# Patient Record
Sex: Female | Born: 1945 | Race: Black or African American | Hispanic: No | Marital: Single | Smoking: Never smoker
Health system: Southern US, Community
[De-identification: ages and names within clinical notes are randomized; demographics above are authoritative.]

## PROBLEM LIST (undated history)

## (undated) DIAGNOSIS — M109 Gout, unspecified: Secondary | ICD-10-CM

## (undated) DIAGNOSIS — Z96641 Presence of right artificial hip joint: Secondary | ICD-10-CM

## (undated) DIAGNOSIS — M1611 Unilateral primary osteoarthritis, right hip: Secondary | ICD-10-CM

## (undated) DIAGNOSIS — M1612 Unilateral primary osteoarthritis, left hip: Secondary | ICD-10-CM

## (undated) DIAGNOSIS — E559 Vitamin D deficiency, unspecified: Secondary | ICD-10-CM

## (undated) DIAGNOSIS — T783XXA Angioneurotic edema, initial encounter: Secondary | ICD-10-CM

## (undated) DIAGNOSIS — K922 Gastrointestinal hemorrhage, unspecified: Secondary | ICD-10-CM

## (undated) DIAGNOSIS — J45909 Unspecified asthma, uncomplicated: Secondary | ICD-10-CM

## (undated) DIAGNOSIS — K5520 Angiodysplasia of colon without hemorrhage: Secondary | ICD-10-CM

## (undated) DIAGNOSIS — M1990 Unspecified osteoarthritis, unspecified site: Secondary | ICD-10-CM

## (undated) DIAGNOSIS — K921 Melena: Secondary | ICD-10-CM

## (undated) DIAGNOSIS — R7303 Prediabetes: Secondary | ICD-10-CM

## (undated) DIAGNOSIS — Z7901 Long term (current) use of anticoagulants: Secondary | ICD-10-CM

## (undated) DIAGNOSIS — E039 Hypothyroidism, unspecified: Secondary | ICD-10-CM

## (undated) DIAGNOSIS — M25551 Pain in right hip: Secondary | ICD-10-CM

## (undated) DIAGNOSIS — Z9289 Personal history of other medical treatment: Secondary | ICD-10-CM

## (undated) DIAGNOSIS — R0600 Dyspnea, unspecified: Secondary | ICD-10-CM

## (undated) DIAGNOSIS — R220 Localized swelling, mass and lump, head: Secondary | ICD-10-CM

## (undated) DIAGNOSIS — I10 Essential (primary) hypertension: Secondary | ICD-10-CM

## (undated) DIAGNOSIS — K219 Gastro-esophageal reflux disease without esophagitis: Secondary | ICD-10-CM

## (undated) DIAGNOSIS — E119 Type 2 diabetes mellitus without complications: Secondary | ICD-10-CM

## (undated) DIAGNOSIS — D509 Iron deficiency anemia, unspecified: Secondary | ICD-10-CM

## (undated) DIAGNOSIS — L309 Dermatitis, unspecified: Secondary | ICD-10-CM

## (undated) DIAGNOSIS — Z96642 Presence of left artificial hip joint: Secondary | ICD-10-CM

## (undated) DIAGNOSIS — D62 Acute posthemorrhagic anemia: Secondary | ICD-10-CM

## (undated) DIAGNOSIS — E785 Hyperlipidemia, unspecified: Secondary | ICD-10-CM

## (undated) DIAGNOSIS — M25561 Pain in right knee: Secondary | ICD-10-CM

## (undated) DIAGNOSIS — H4010X0 Unspecified open-angle glaucoma, stage unspecified: Secondary | ICD-10-CM

## (undated) DIAGNOSIS — I82409 Acute embolism and thrombosis of unspecified deep veins of unspecified lower extremity: Secondary | ICD-10-CM

## (undated) HISTORY — DX: Gastro-esophageal reflux disease without esophagitis: K21.9

## (undated) HISTORY — DX: Acute posthemorrhagic anemia: D62

## (undated) HISTORY — DX: Pain in right hip: M25.551

## (undated) HISTORY — DX: Presence of left artificial hip joint: Z96.642

## (undated) HISTORY — DX: Pain in right knee: M25.561

## (undated) HISTORY — DX: Long term (current) use of anticoagulants: Z79.01

## (undated) HISTORY — PX: THYROIDECTOMY: SHX17

## (undated) HISTORY — DX: Unilateral primary osteoarthritis, right hip: M16.11

## (undated) HISTORY — DX: Gastrointestinal hemorrhage, unspecified: K92.2

## (undated) HISTORY — DX: Presence of right artificial hip joint: Z96.641

## (undated) HISTORY — DX: Melena: K92.1

## (undated) HISTORY — DX: Hypomagnesemia: E83.42

## (undated) HISTORY — PX: ABDOMINAL HYSTERECTOMY: SHX81

## (undated) HISTORY — DX: Unilateral primary osteoarthritis, left hip: M16.12

---

## 2016-02-09 DIAGNOSIS — T783XXA Angioneurotic edema, initial encounter: Secondary | ICD-10-CM

## 2016-02-09 HISTORY — PX: COLONOSCOPY: SHX174

## 2016-02-09 HISTORY — DX: Angioneurotic edema, initial encounter: T78.3XXA

## 2016-02-09 HISTORY — PX: ESOPHAGOGASTRODUODENOSCOPY: SHX1529

## 2016-02-09 HISTORY — PX: GIVENS CAPSULE STUDY: SHX5432

## 2016-06-08 DIAGNOSIS — I82409 Acute embolism and thrombosis of unspecified deep veins of unspecified lower extremity: Secondary | ICD-10-CM

## 2016-06-08 HISTORY — DX: Acute embolism and thrombosis of unspecified deep veins of unspecified lower extremity: I82.409

## 2016-08-08 DIAGNOSIS — R220 Localized swelling, mass and lump, head: Secondary | ICD-10-CM

## 2016-08-08 HISTORY — DX: Localized swelling, mass and lump, head: R22.0

## 2016-08-17 ENCOUNTER — Encounter (HOSPITAL_COMMUNITY): Payer: Self-pay | Admitting: *Deleted

## 2016-08-17 ENCOUNTER — Observation Stay (HOSPITAL_COMMUNITY)
Admission: EM | Admit: 2016-08-17 | Discharge: 2016-08-18 | Disposition: A | Discharge status: Home / Self Care | Payer: Medicare Other | Attending: Internal Medicine | Admitting: Internal Medicine

## 2016-08-17 DIAGNOSIS — K219 Gastro-esophageal reflux disease without esophagitis: Secondary | ICD-10-CM

## 2016-08-17 DIAGNOSIS — Z7901 Long term (current) use of anticoagulants: Secondary | ICD-10-CM | POA: Insufficient documentation

## 2016-08-17 DIAGNOSIS — E079 Disorder of thyroid, unspecified: Secondary | ICD-10-CM | POA: Insufficient documentation

## 2016-08-17 DIAGNOSIS — R600 Localized edema: Secondary | ICD-10-CM | POA: Diagnosis present

## 2016-08-17 DIAGNOSIS — R05 Cough: Secondary | ICD-10-CM | POA: Insufficient documentation

## 2016-08-17 DIAGNOSIS — E039 Hypothyroidism, unspecified: Secondary | ICD-10-CM

## 2016-08-17 DIAGNOSIS — Z91010 Allergy to peanuts: Secondary | ICD-10-CM | POA: Diagnosis not present

## 2016-08-17 DIAGNOSIS — Z7982 Long term (current) use of aspirin: Secondary | ICD-10-CM | POA: Diagnosis not present

## 2016-08-17 DIAGNOSIS — I10 Essential (primary) hypertension: Secondary | ICD-10-CM | POA: Insufficient documentation

## 2016-08-17 DIAGNOSIS — Z7984 Long term (current) use of oral hypoglycemic drugs: Secondary | ICD-10-CM | POA: Insufficient documentation

## 2016-08-17 DIAGNOSIS — D649 Anemia, unspecified: Secondary | ICD-10-CM | POA: Insufficient documentation

## 2016-08-17 DIAGNOSIS — T783XXA Angioneurotic edema, initial encounter: Secondary | ICD-10-CM | POA: Diagnosis not present

## 2016-08-17 DIAGNOSIS — D500 Iron deficiency anemia secondary to blood loss (chronic): Secondary | ICD-10-CM

## 2016-08-17 DIAGNOSIS — R21 Rash and other nonspecific skin eruption: Secondary | ICD-10-CM | POA: Insufficient documentation

## 2016-08-17 DIAGNOSIS — E119 Type 2 diabetes mellitus without complications: Secondary | ICD-10-CM | POA: Diagnosis not present

## 2016-08-17 DIAGNOSIS — D509 Iron deficiency anemia, unspecified: Secondary | ICD-10-CM

## 2016-08-17 DIAGNOSIS — Z79899 Other long term (current) drug therapy: Secondary | ICD-10-CM | POA: Diagnosis not present

## 2016-08-17 DIAGNOSIS — E785 Hyperlipidemia, unspecified: Secondary | ICD-10-CM

## 2016-08-17 DIAGNOSIS — I82409 Acute embolism and thrombosis of unspecified deep veins of unspecified lower extremity: Secondary | ICD-10-CM

## 2016-08-17 HISTORY — DX: Localized swelling, mass and lump, head: R22.0

## 2016-08-17 HISTORY — DX: Acute embolism and thrombosis of unspecified deep veins of unspecified lower extremity: I82.409

## 2016-08-17 HISTORY — DX: Hyperlipidemia, unspecified: E78.5

## 2016-08-17 HISTORY — DX: Type 2 diabetes mellitus without complications: E11.9

## 2016-08-17 HISTORY — DX: Gastro-esophageal reflux disease without esophagitis: K21.9

## 2016-08-17 HISTORY — DX: Essential (primary) hypertension: I10

## 2016-08-17 LAB — CBC WITH DIFFERENTIAL/PLATELET
BASOS ABS: 0 10*3/uL (ref 0.0–0.1)
Basophils Relative: 0 %
EOS PCT: 2 %
Eosinophils Absolute: 0.2 10*3/uL (ref 0.0–0.7)
HEMATOCRIT: 21.9 % — AB (ref 36.0–46.0)
Hemoglobin: 6.3 g/dL — CL (ref 12.0–15.0)
LYMPHS ABS: 2.4 10*3/uL (ref 0.7–4.0)
Lymphocytes Relative: 31 %
MCH: 20.8 pg — ABNORMAL LOW (ref 26.0–34.0)
MCHC: 28.8 g/dL — AB (ref 30.0–36.0)
MCV: 72.3 fL — AB (ref 78.0–100.0)
MONOS PCT: 5 %
Monocytes Absolute: 0.4 10*3/uL (ref 0.1–1.0)
NEUTROS ABS: 4.6 10*3/uL (ref 1.7–7.7)
Neutrophils Relative %: 62 %
Platelets: 332 10*3/uL (ref 150–400)
RBC: 3.03 MIL/uL — ABNORMAL LOW (ref 3.87–5.11)
RDW: 17.3 % — AB (ref 11.5–15.5)
WBC: 7.6 10*3/uL (ref 4.0–10.5)

## 2016-08-17 LAB — HEPATIC FUNCTION PANEL
ALK PHOS: 85 U/L (ref 38–126)
ALT: 14 U/L (ref 14–54)
AST: 21 U/L (ref 15–41)
Albumin: 3.7 g/dL (ref 3.5–5.0)
BILIRUBIN TOTAL: 1.1 mg/dL (ref 0.3–1.2)
Bilirubin, Direct: <0.1 mg/dL — ABNORMAL LOW (ref 0.1–0.5)
TOTAL PROTEIN: 7.2 g/dL (ref 6.5–8.1)

## 2016-08-17 LAB — CBC
HCT: 31.2 % — ABNORMAL LOW (ref 36.0–46.0)
Hemoglobin: 9.7 g/dL — ABNORMAL LOW (ref 12.0–15.0)
MCH: 23.4 pg — ABNORMAL LOW (ref 26.0–34.0)
MCHC: 31.1 g/dL (ref 30.0–36.0)
MCV: 75.4 fL — ABNORMAL LOW (ref 78.0–100.0)
PLATELETS: 336 10*3/uL (ref 150–400)
RBC: 4.14 MIL/uL (ref 3.87–5.11)
RDW: 18.3 % — AB (ref 11.5–15.5)
WBC: 8.5 10*3/uL (ref 4.0–10.5)

## 2016-08-17 LAB — BASIC METABOLIC PANEL
Anion gap: 10 (ref 5–15)
BUN: 10 mg/dL (ref 6–20)
CO2: 22 mmol/L (ref 22–32)
Calcium: 9.1 mg/dL (ref 8.9–10.3)
Chloride: 107 mmol/L (ref 101–111)
Creatinine, Ser: 0.85 mg/dL (ref 0.44–1.00)
GFR calc Af Amer: >60 mL/min (ref >60)
GLUCOSE: 103 mg/dL — AB (ref 65–99)
POTASSIUM: 3.5 mmol/L (ref 3.5–5.1)
Sodium: 139 mmol/L (ref 135–145)

## 2016-08-17 LAB — GLUCOSE, CAPILLARY
GLUCOSE-CAPILLARY: 187 mg/dL — AB (ref 65–99)
Glucose-Capillary: 259 mg/dL — ABNORMAL HIGH (ref 65–99)
Glucose-Capillary: 175 mg/dL — ABNORMAL HIGH (ref 65–99)

## 2016-08-17 LAB — URINALYSIS, ROUTINE W REFLEX MICROSCOPIC
BILIRUBIN URINE: NEGATIVE
Glucose, UA: NEGATIVE mg/dL
HGB URINE DIPSTICK: NEGATIVE
KETONES UR: NEGATIVE mg/dL
Leukocytes, UA: NEGATIVE
Nitrite: NEGATIVE
PROTEIN: NEGATIVE mg/dL
Specific Gravity, Urine: 1.011 (ref 1.005–1.030)
pH: 5 (ref 5.0–8.0)

## 2016-08-17 LAB — IRON AND TIBC
Iron: 22 ug/dL — ABNORMAL LOW (ref 28–170)
SATURATION RATIOS: 5 % — AB (ref 10.4–31.8)
TIBC: 406 ug/dL (ref 250–450)
UIBC: 384 ug/dL

## 2016-08-17 LAB — RETICULOCYTES
RBC.: 4.2 MIL/uL (ref 3.87–5.11)
RETIC COUNT ABSOLUTE: 100.8 10*3/uL (ref 19.0–186.0)
RETIC CT PCT: 2.4 % (ref 0.4–3.1)

## 2016-08-17 LAB — FERRITIN: Ferritin: 5 ng/mL — ABNORMAL LOW (ref 11–307)

## 2016-08-17 LAB — LACTATE DEHYDROGENASE: LDH: 156 U/L (ref 98–192)

## 2016-08-17 LAB — OCCULT BLOOD X 1 CARD TO LAB, STOOL: Fecal Occult Bld: POSITIVE — AB

## 2016-08-17 LAB — ABO/RH: ABO/RH(D): O POS

## 2016-08-17 LAB — PREPARE RBC (CROSSMATCH)

## 2016-08-17 LAB — SAVE SMEAR

## 2016-08-17 MED ORDER — POLYETHYLENE GLYCOL 3350 17 G PO PACK: 17.0000 g | PACK | Freq: Every day | ORAL | Status: DC | PRN

## 2016-08-17 MED ORDER — PREDNISONE 20 MG PO TABS
40.0000 mg | ORAL_TABLET | Freq: Every day | ORAL | Status: DC
  Administered 2016-08-18: 40 mg via ORAL
  Filled 2016-08-17: qty 2

## 2016-08-17 MED ORDER — HYDRALAZINE HCL 25 MG PO TABS
25.0000 mg | ORAL_TABLET | Freq: Three times a day (TID) | ORAL | Status: DC
  Administered 2016-08-17 – 2016-08-18 (×5): 25 mg via ORAL
  Filled 2016-08-17 (×5): qty 1

## 2016-08-17 MED ORDER — LIDOCAINE HCL 4 % EX SOLN
Freq: Once | CUTANEOUS | Status: DC
  Filled 2016-08-17: qty 50

## 2016-08-17 MED ORDER — ENOXAPARIN SODIUM 40 MG/0.4ML ~~LOC~~ SOLN
40.0000 mg | SUBCUTANEOUS | Status: DC
  Administered 2016-08-17: 40 mg via SUBCUTANEOUS
  Filled 2016-08-17: qty 0.4

## 2016-08-17 MED ORDER — EPINEPHRINE 0.3 MG/0.3ML IJ SOAJ
0.3000 mg | Freq: Once | INTRAMUSCULAR | Status: AC
  Administered 2016-08-17: 0.3 mg via INTRAMUSCULAR
  Filled 2016-08-17: qty 0.3

## 2016-08-17 MED ORDER — LIDOCAINE VISCOUS 2 % MT SOLN
15.0000 mL | Freq: Once | OROMUCOSAL | Status: DC
  Filled 2016-08-17: qty 15

## 2016-08-17 MED ORDER — PRAVASTATIN SODIUM 20 MG PO TABS
20.0000 mg | ORAL_TABLET | Freq: Every day | ORAL | Status: DC
  Administered 2016-08-17 – 2016-08-18 (×2): 20 mg via ORAL
  Filled 2016-08-17 (×2): qty 1

## 2016-08-17 MED ORDER — METHYLPREDNISOLONE SODIUM SUCC 125 MG IJ SOLR
125.0000 mg | Freq: Once | INTRAMUSCULAR | Status: AC
  Administered 2016-08-17: 125 mg via INTRAVENOUS
  Filled 2016-08-17: qty 2

## 2016-08-17 MED ORDER — PANTOPRAZOLE SODIUM 40 MG PO TBEC
40.0000 mg | DELAYED_RELEASE_TABLET | Freq: Every day | ORAL | Status: DC
  Administered 2016-08-17 – 2016-08-18 (×2): 40 mg via ORAL
  Filled 2016-08-17 (×2): qty 1

## 2016-08-17 MED ORDER — DIPHENHYDRAMINE HCL 25 MG PO CAPS
50.0000 mg | ORAL_CAPSULE | Freq: Once | ORAL | Status: AC
  Administered 2016-08-17: 50 mg via ORAL
  Filled 2016-08-17: qty 2

## 2016-08-17 MED ORDER — METOPROLOL SUCCINATE ER 50 MG PO TB24
50.0000 mg | ORAL_TABLET | Freq: Every day | ORAL | Status: DC
  Administered 2016-08-17: 50 mg via ORAL
  Filled 2016-08-17: qty 1

## 2016-08-17 MED ORDER — ACETAMINOPHEN 325 MG PO TABS: 650.0000 mg | ORAL_TABLET | Freq: Four times a day (QID) | ORAL | Status: DC | PRN

## 2016-08-17 MED ORDER — LEVOTHYROXINE SODIUM 100 MCG PO TABS
100.0000 ug | ORAL_TABLET | Freq: Every day | ORAL | Status: DC
  Administered 2016-08-18: 100 ug via ORAL
  Filled 2016-08-17: qty 1

## 2016-08-17 MED ORDER — INSULIN ASPART 100 UNIT/ML ~~LOC~~ SOLN: 0.0000 [IU] | Freq: Every day | SUBCUTANEOUS | Status: DC

## 2016-08-17 MED ORDER — INSULIN ASPART 100 UNIT/ML ~~LOC~~ SOLN: 0.0000 [IU] | Freq: Three times a day (TID) | SUBCUTANEOUS | Status: DC

## 2016-08-17 MED ORDER — ACETAMINOPHEN 650 MG RE SUPP: 650.0000 mg | Freq: Four times a day (QID) | RECTAL | Status: DC | PRN

## 2016-08-17 MED ORDER — DIPHENHYDRAMINE HCL 50 MG/ML IJ SOLN
25.0000 mg | Freq: Once | INTRAMUSCULAR | Status: AC
  Administered 2016-08-17: 25 mg via INTRAVENOUS
  Filled 2016-08-17: qty 1

## 2016-08-17 MED ORDER — SODIUM CHLORIDE 0.9 % IV SOLN
10.0000 mL/h | Freq: Once | INTRAVENOUS | Status: AC
  Administered 2016-08-17: 10 mL/h via INTRAVENOUS

## 2016-08-17 MED ORDER — FERROUS GLUCONATE 324 (38 FE) MG PO TABS
324.0000 mg | ORAL_TABLET | Freq: Every day | ORAL | Status: DC
  Administered 2016-08-18: 324 mg via ORAL
  Filled 2016-08-17: qty 1

## 2016-08-17 MED ORDER — FAMOTIDINE IN NACL 20-0.9 MG/50ML-% IV SOLN
20.0000 mg | Freq: Once | INTRAVENOUS | Status: AC
  Administered 2016-08-17: 20 mg via INTRAVENOUS
  Filled 2016-08-17: qty 50

## 2016-08-17 MED ORDER — ALLOPURINOL 100 MG PO TABS
100.0000 mg | ORAL_TABLET | Freq: Every day | ORAL | Status: DC
  Administered 2016-08-17 – 2016-08-18 (×2): 100 mg via ORAL
  Filled 2016-08-17 (×2): qty 1

## 2016-08-17 MED ORDER — RIVAROXABAN 20 MG PO TABS
20.0000 mg | ORAL_TABLET | Freq: Every day | ORAL | Status: DC
  Administered 2016-08-17 – 2016-08-18 (×2): 20 mg via ORAL
  Filled 2016-08-17 (×2): qty 1

## 2016-08-17 NOTE — ED Notes (Signed)
IV team at bedside 

## 2016-08-17 NOTE — ED Provider Notes (Addendum)
[facility]ENCY DEPT Provider Note   CSN: [id number] Arrival date & time: 08/17/16  0417     History   Chief Complaint Chief Complaint  Patient presents with  . Facial Swelling    HPI Dana Nelson is a 71 y.o. female.  HPI  Pt with hx of DM, HTN, HL, DVT on xarelto comes in with cc of tongue swelling. Pt woke up in the middle of the night with cough and saw the swelling over her lips. Pt took benadryl, and went to bed. She woke up later in the night and th swelling had gotten worse - so she decided to come to the Er. Pt has some DIB. Pt also reports some irritation over the throat. She has no drooling. She has no symptoms of this nature in the past. PT is not on new meds and there was nothing out of ordinary yday.  Pt is noted to have severe anemia. She reports that she was admitted at [facility] in [place] 2 weeks ago for anemia and needed transfusion. Pt had upper and lower EGD that was neg.   Past Medical History:  Diagnosis Date  . Diabetes mellitus without complication (HCC)   . Hyperlipemia   . Hypertension   . Thyroid disease     There are no active problems to display for this patient.   No past surgical history on file.  OB History    No data available       Home Medications    Prior to Admission medications   Medication Sig Start Date End Date Taking? Authorizing Provider  acetaminophen (TYLENOL) 500 MG tablet Take 500-1,000 mg by mouth every 6 (six) hours as needed for mild pain.   Yes [provider]  allopurinol (ZYLOPRIM) 100 MG tablet Take 100 mg by mouth daily.   Yes [provider]  aspirin EC 81 MG tablet Take 81 mg by mouth daily.   Yes [provider]  ferrous gluconate (FERGON) 324 MG tablet Take 324 mg by mouth daily with breakfast.   Yes [provider]  furosemide (LASIX) 40 MG tablet Take 40 mg by mouth every other day.    Yes [provider]  hydrALAZINE (APRESOLINE) 25 MG tablet  Take 25 mg by mouth 3 (three) times daily.   Yes [provider]  levothyroxine (SYNTHROID, LEVOTHROID) 100 MCG tablet Take 100 mcg by mouth daily before breakfast.   Yes [provider]  losartan-hydrochlorothiazide (HYZAAR) 100-25 MG tablet Take 1 tablet by mouth daily.   Yes [provider]  metFORMIN (GLUCOPHAGE-XR) 750 MG 24 hr tablet Take 750 mg by mouth 2 (two) times daily.   Yes [provider]  metoprolol succinate (TOPROL-XL) 50 MG 24 hr tablet Take 50 mg by mouth daily. Take with or immediately following a meal.   Yes [provider]  omeprazole (PRILOSEC) 20 MG capsule Take 40 mg by mouth daily.   Yes [provider]  pravastatin (PRAVACHOL) 20 MG tablet Take 20 mg by mouth daily.   Yes [provider]  rivaroxaban (XARELTO) 20 MG TABS tablet Take 20 mg by mouth daily.   Yes [provider]    Family History No family history on file.  Social History Social History  Substance Use Topics  . Smoking status: Never Smoker  . Smokeless tobacco: Not on file  . Alcohol use Not on file     Allergies   Peanut-containing drug products and Penicillins   Review of  Systems Review of Systems  Constitutional: Positive for activity change.  Skin: Positive for rash.  All other systems reviewed and are negative.    Physical Exam Updated Vital Signs BP (!) 159/84   Pulse 94   Temp 98.1 F (36.7 C)   Resp (!) 22   Ht 5\' 3"  (1.6 m)   Wt 76.2 kg (168 lb)   SpO2 100%   BMI 29.76 kg/m   Physical Exam  Constitutional: She is oriented to person, place, and time. She appears well-developed and well-nourished.  HENT:  Head: Normocephalic and atraumatic.  Both lips are edematous, with the upper lip being worse. Oral exam reveals no tongue swelling and the uvula is visible. There is no stridor. Pt has no drooling and is tolerating her secretions w/o problems  Eyes: EOM are normal. Pupils are equal, round, and  reactive to light.  Neck: Neck supple.  Cardiovascular: Normal rate, regular rhythm and normal heart sounds.   No murmur heard. Pulmonary/Chest: Effort normal. No stridor. No respiratory distress. She has no wheezes.  Abdominal: Soft. She exhibits no distension. There is no tenderness. There is no rebound and no guarding.  Neurological: She is alert and oriented to person, place, and time.  Skin: Skin is warm and dry.  Nursing note and vitals reviewed.    ED Treatments / Results  Labs (all labs ordered are listed, but only abnormal results are displayed) Labs Reviewed  BASIC METABOLIC PANEL - Abnormal; Notable for the following:       Result Value   Glucose, Bld 103 (*)    All other components within normal limits  CBC WITH DIFFERENTIAL/PLATELET - Abnormal; Notable for the following:    RBC 3.03 (*)    Hemoglobin 6.3 (*)    HCT 21.9 (*)    MCV 72.3 (*)    MCH 20.8 (*)    MCHC 28.8 (*)    RDW 17.3 (*)    All other components within normal limits  VITAMIN B12  FOLATE  IRON AND TIBC  FERRITIN  RETICULOCYTES  SAVE SMEAR  TYPE AND SCREEN  PREPARE RBC (CROSSMATCH)    EKG  EKG Interpretation None       Radiology No results found.  Procedures Procedures (including critical care time)  CRITICAL CARE Performed by: Derwood Kaplan   Total critical care time: 45 minutes  Critical care time was exclusive of separately billable procedures and treating other patients.  Critical care was necessary to treat or prevent imminent or life-threatening deterioration.  Critical care was time spent personally by me on the following activities: development of treatment plan with patient and/or surrogate as well as nursing, discussions with consultants, evaluation of patient's response to treatment, examination of patient, obtaining history from patient or surrogate, ordering and performing treatments and interventions, ordering and review of laboratory studies, ordering and review  of radiographic studies, pulse oximetry and re-evaluation of patient's condition.   Medications Ordered in ED Medications  lidocaine (XYLOCAINE) 4 % external solution (0 mLs Topical Hold 08/17/16 0459)  lidocaine (XYLOCAINE) 2 % viscous mouth solution 15 mL (0 mLs Mouth/Throat Hold 08/17/16 0459)  0.9 %  sodium chloride infusion (not administered)  EPINEPHrine (EPI-PEN) injection 0.3 mg (0.3 mg Intramuscular Given 08/17/16 0446)  diphenhydrAMINE (BENADRYL) injection 25 mg (25 mg Intravenous Given 08/17/16 0447)  famotidine (PEPCID) IVPB 20 mg premix (0 mg Intravenous Stopped 08/17/16 0519)  methylPREDNISolone sodium succinate (SOLU-MEDROL) 125 mg/2 mL injection 125 mg (125 mg Intravenous Given 08/17/16 0446)  Initial Impression / Assessment and Plan / ED Course  I have reviewed the triage vital signs and the nursing notes.  Pertinent labs & imaging results that were available during my care of the patient were reviewed by me and considered in my medical decision making (see chart for details).  Clinical Course as of Aug 17 813  Tue Aug 17, 2016  0538 S/p epi - feels better. No stridor, no drooling. Lower lip swelling has improved.  [AN]    Clinical Course User Index [AN] Derwood Kaplan, MD    Pt comes in with angioedema. Epi given as she has some throat complains and is having DIB. IV benadryl and steroids.  Pt has anemia.  Anemia panel ordered. Records from OSH requested. Transfusion started.  Final Clinical Impressions(s) / ED Diagnoses   Final diagnoses:  Angioedema, initial encounter  Symptomatic anemia    New Prescriptions New Prescriptions   No medications on file     Derwood Kaplan, MD 08/17/16 8119    Derwood Kaplan, MD 08/17/16 [phone]

## 2016-08-17 NOTE — ED Triage Notes (Signed)
Pt states that she went to bed last night. Pt states that she woke coughing and went to the restroom. States that her t op lip was swollen then. Pt states that she took a benadryl and went to bed. Woke again with upper and lower lip swelling, eye swelling and states "my throat doesn't feel right". Pt reports SOB at triage.

## 2016-08-17 NOTE — H&P (Signed)
Date: 08/17/2016               Patient Name:  Dana Nelson MRN: [id number]  DOB: [birth year] Age / Sex: 71 y.o., female   PCP: Patient, No Pcp Per         Medical Service: Internal Medicine Teaching Service         Attending Physician: Dr. Doneen Poisson, MD    First Contact: Dr. Caron Presume Pager: [phone]  Second Contact: Dr. Loney Loh Pager: [phone]       After Hours (After 5p/  First Contact Pager: [phone]  weekends / holidays): Second Contact Pager: [phone]   Chief Complaint: lip swelling and SOB  History of Present Illness:  This is a 71 y.o. woman with PMHx of recent left LE DVT (on Xarelto), HTN, HLD, hypothyroidism, HLD, acid reflux, OA here with lip and facial swelling and also found to be anemic.  She is visiting from [place].  Patient was in her usual state of health last evening.  Took her evening medications and went to bed at 11:30pm feeling fine.  Shortly after this she started coughing and her face "felt funny".  Her upper lip was swollen so she took a Benadryl and went back to bed.  3 hours later, her bottom lip was swollen and her eyes felt funny now.  She denies any changes in vision or blurry vision.  She also felt dyspneic and thought she was wheezing.  At this point, she decided to seek ED evaluation.  In the ED, she was given Benadryl, Epinephrine, Pepcid, and Solumedrol.  She reports feeling a lot better.  She reports no prior history of this type of swelling.  She has had eczema since childhood and has skin-changes associated with this.  She is allergic to peanut products and penicllins.  She denies any new detergents, soap products, clothes, etc.  Yesterday, she ate a fresh peach and a beef patty with cheese at 2pm.  She then had a Slushy for dinner.  Her grandchildren had banana nut muffins but she said she did not come in to contact with the muffin.  She spent all day outside but does not recall any insect bites.  She is not on an ACE inhibitor but she is on an  ARB.  In the ED, labs revealed a hemoglobin of 6.3 with MCV 72.  She reports symptoms of SOB with exertion off an on for the past week or so.  She denies any chest pressure or discomfort.  No lightheadedness or dizziness.  Occasionally, her fingers feel cold.  She reports LE edema with Left > Right.  She denies any orthopnea or PND.  She reports a history of low hemoglobin.  She was hospitalized in [place] last year and required blood transfusion.  2-3 months ago she was hospitalized at [facility] in [place] and diagnosed with DVT and started on Xarelto.  She denied any recent surgery, trauma, known malignancy, estrogen use, travel that would suggest a provoked blood clot.  She tolerated Xarelto well, but about 3 weeks ago noticed blood in her stool and was hospitalized for blood transfusion again.  She said her hemoglobin at that time was about 7.  She received an EGD and colonoscopy at that time.  She reports a polyp was removed but otherwise was normal.  Does not recall being told about any diverticulosis or AVM.  Has not experienced any bleeding since.  She denies taking any NSAIDs.  She is on Prilosec.  Records from her OSH have been requested.  Meds:  Current Meds  Medication Sig  . acetaminophen (TYLENOL) 500 MG tablet Take 500-1,000 mg by mouth every 6 (six) hours as needed for mild pain.  Marland Kitchen allopurinol (ZYLOPRIM) 100 MG tablet Take 100 mg by mouth daily.  Marland Kitchen aspirin EC 81 MG tablet Take 81 mg by mouth daily.  . ferrous gluconate (FERGON) 324 MG tablet Take 324 mg by mouth daily with breakfast.  . furosemide (LASIX) 40 MG tablet Take 40 mg by mouth every other day.   . hydrALAZINE (APRESOLINE) 25 MG tablet Take 25 mg by mouth 3 (three) times daily.  Marland Kitchen levothyroxine (SYNTHROID, LEVOTHROID) 100 MCG tablet Take 100 mcg by mouth daily before breakfast.  . losartan-hydrochlorothiazide (HYZAAR) 100-25 MG tablet Take 1 tablet by mouth daily.  . metFORMIN (GLUCOPHAGE-XR) 750 MG 24 hr tablet Take 750  mg by mouth 2 (two) times daily.  . metoprolol succinate (TOPROL-XL) 50 MG 24 hr tablet Take 50 mg by mouth daily. Take with or immediately following a meal.  . omeprazole (PRILOSEC) 20 MG capsule Take 40 mg by mouth daily.  . pravastatin (PRAVACHOL) 20 MG tablet Take 20 mg by mouth daily.  . rivaroxaban (XARELTO) 20 MG TABS tablet Take 20 mg by mouth daily.     Allergies: Allergies as of 08/17/2016 - Review Complete 08/17/2016  Allergen Reaction Noted  . Peanut-containing drug products Other (See Comments) 08/17/2016  . Penicillins Other (See Comments) 08/17/2016   Past Medical History:  Diagnosis Date  . Diabetes mellitus without complication (HCC)   . Hyperlipemia   . Hypertension   . Thyroid disease     Family History:  Multiple family members with HTN, HLD, DM, thyroid disease Sister is living but had MI at 71, hx of ovarian cancer Brother had an MI at 70 Father had MI Mother had dementia  Social History: Originally from [place], then lived in [place] for many years.  Worked for [employer] site for 37 years.  Moved to [place] a year a go.  Single.  2 children (son- 103, daughter - 60).  Denies EtOH or tobacco use.  Review of Systems: A complete ROS was negative except as per HPI.   Physical Exam: Blood pressure (!) 159/84, pulse 88, temperature 98.1 F (36.7 C), resp. rate (!) 21, height 5\' 3"  (1.6 m), weight 168 lb (76.2 kg), SpO2 100 %. Physical Exam  Constitutional: She is oriented to person, place, and time and well-developed, well-nourished, and in no distress.  HENT:  Head: Normocephalic and atraumatic.  Mouth/Throat: No oropharyngeal exudate.  Upper lip edematous more so than lower.  There is no tongue swelling, drooling.  Uvula is visible.  She is speaking in complete sentences and tolerating secretions.  Eyes: Conjunctivae and EOM are normal.  Neck: Normal range of motion. Neck supple.  Cardiovascular: Normal rate, regular rhythm and normal heart sounds.    No murmur heard. Pulmonary/Chest: Effort normal and breath sounds normal. No respiratory distress. She has no wheezes.  Abdominal: Soft. She exhibits no distension. There is no tenderness. There is no rebound.  Musculoskeletal: She exhibits edema. She exhibits no tenderness.  Trace lower extremity edema L>R.  Lymphadenopathy:    She has no cervical adenopathy.  Neurological: She is alert and oriented to person, place, and time. No cranial nerve deficit.  Skin: Rash noted.  Vitiliginous changes to her upper and lower extremities as well as neck and scalp.  Eczematous skin changes noted on  her arms as well.  Psychiatric: Mood and affect normal.     Assessment & Plan by Problem: Principal Problem:   Lip swelling Active Problems:   Symptomatic anemia   DVT (deep venous thrombosis) (HCC)   HTN (hypertension)   Hyperlipidemia   Microcytic anemia   Hypothyroidism   GERD (gastroesophageal reflux disease)  Lip Swelling Differential includes: Angioedema (idiopathic vs ARB-induced), contact dermatitis, exposure to allergen such as nuts, or severe hypothyroidism which can cause puffiness of the skin and face.  The latter seems unlikely given she has no other symptoms c/w severe hypothyroid and reports adherence with her Synthroid.  Available evidence suggests the rate of angioedema with ARB therapy is low but remains a possibility.  It is very possible that she may have come in to contact with an allergen to cause a localized swelling reaction.  She denies recent use of NSAIDs and her only new medication is Xarelto.  Currently, she is improved on treatment thus far, speaking in full sentences, and tolerating her secretions. - Benadryl 50mg  PO x 1 dose - Prednisone 40mg  daily x 5 days - If symptoms recur, repeat Benadryl, Pepcid, Epinephrine, and Solumedrol - Hold ARB for now - Will not obtain C4 levels at this time as her history is not consistent with HAE or acquired C1 inhibitor deficiency  (i.e. She had response to antihistamine/steroids and there is no family history of similar events)  Symptomatic Microcytic Anemia Possible GI Bleed She has been experiencing symptoms of DOE for about 1 week.  Hospitalized last month for anemia in [place] with reportedly normal EGD and colonoscopy.  Has continued Xarelto.  She reports her hemoglobin was around 7 at that time and today it is 6.3.  Suspect this is a slow bleed from somewhere given her relatively small hemoglobin drop.  She has not noticed any melena or tarry stools.  Post-menopausal and denies vaginal bleeding.  Denies hematuria.  She has noticed some bleeding with wiping on toilet paper.  Ultimately, she may need further outpatient work up with capsule endoscopy. - Check UA for any evidence of bleeding - Transfuse 1 unit PRBC.  Check post-transfusion H&H - CBC Q12H - Obtain records from OSH - Consider GI consult  - Check FOBT - Check Iron and Ferritin - Save peripheral smear - Check reticulocytes, LDH, haptoglobin - Continue PO Iron.  Consider Feraheme infusion - Hold ASA - Continue Xarelto.  Her last dose was 7/9 at 9am.  Watch for further evidence of bleeding. - Continue PPI - Maintain IV access  Hx of Recent DVT - Continue Xarelto given how recent her DVT was and that she does not endorse any active bleeding. - TED Hose  HTN - Hold HCTZ/ARB combination and Lasix - Continue Hydralazine 25 mg TID - Continue Metoprolol succinate 50 mg daily   HLD - Continue Pravastatin 20 mg daily  Diabetes - Hold Metformin - Sliding Scale Insulin with HS coverage - A1c  Hypothyroidism - Continue Synthroid  GERD - Continue PPI  HM - Check HIV  Dispo: Admit patient to Observation with expected length of stay less than 2 midnights.  SignedGwynn Burly: Robbye Dede, DO 08/17/2016, 9:05 AM  Pager: [phone]

## 2016-08-18 DIAGNOSIS — Z79899 Other long term (current) drug therapy: Secondary | ICD-10-CM | POA: Diagnosis not present

## 2016-08-18 DIAGNOSIS — D509 Iron deficiency anemia, unspecified: Secondary | ICD-10-CM

## 2016-08-18 DIAGNOSIS — T783XXA Angioneurotic edema, initial encounter: Secondary | ICD-10-CM | POA: Diagnosis not present

## 2016-08-18 DIAGNOSIS — I82409 Acute embolism and thrombosis of unspecified deep veins of unspecified lower extremity: Secondary | ICD-10-CM

## 2016-08-18 DIAGNOSIS — E039 Hypothyroidism, unspecified: Secondary | ICD-10-CM | POA: Diagnosis not present

## 2016-08-18 DIAGNOSIS — I10 Essential (primary) hypertension: Secondary | ICD-10-CM | POA: Diagnosis not present

## 2016-08-18 DIAGNOSIS — E785 Hyperlipidemia, unspecified: Secondary | ICD-10-CM

## 2016-08-18 DIAGNOSIS — K219 Gastro-esophageal reflux disease without esophagitis: Secondary | ICD-10-CM

## 2016-08-18 DIAGNOSIS — Z91010 Allergy to peanuts: Secondary | ICD-10-CM | POA: Diagnosis not present

## 2016-08-18 DIAGNOSIS — Z880 Allergy status to penicillin: Secondary | ICD-10-CM | POA: Diagnosis not present

## 2016-08-18 LAB — GLUCOSE, CAPILLARY
GLUCOSE-CAPILLARY: 109 mg/dL — AB (ref 65–99)
GLUCOSE-CAPILLARY: 146 mg/dL — AB (ref 65–99)
GLUCOSE-CAPILLARY: 168 mg/dL — AB (ref 65–99)

## 2016-08-18 LAB — CBC
HCT: 32.2 % — ABNORMAL LOW (ref 36.0–46.0)
HEMOGLOBIN: 10.1 g/dL — AB (ref 12.0–15.0)
MCH: 23.8 pg — AB (ref 26.0–34.0)
MCHC: 31.4 g/dL (ref 30.0–36.0)
MCV: 75.9 fL — AB (ref 78.0–100.0)
Platelets: 336 10*3/uL (ref 150–400)
RBC: 4.24 MIL/uL (ref 3.87–5.11)
RDW: 18.5 % — ABNORMAL HIGH (ref 11.5–15.5)
WBC: 10.2 10*3/uL (ref 4.0–10.5)

## 2016-08-18 LAB — BASIC METABOLIC PANEL
ANION GAP: 10 (ref 5–15)
BUN: 16 mg/dL (ref 6–20)
CHLORIDE: 107 mmol/L (ref 101–111)
CO2: 21 mmol/L — AB (ref 22–32)
Calcium: 9.6 mg/dL (ref 8.9–10.3)
Creatinine, Ser: 0.87 mg/dL (ref 0.44–1.00)
GFR calc non Af Amer: >60 mL/min (ref >60)
Glucose, Bld: 109 mg/dL — ABNORMAL HIGH (ref 65–99)
Potassium: 4.3 mmol/L (ref 3.5–5.1)
Sodium: 138 mmol/L (ref 135–145)

## 2016-08-18 LAB — TYPE AND SCREEN
ABO/RH(D): O POS
ANTIBODY SCREEN: NEGATIVE
Unit division: 0
Unit division: 0

## 2016-08-18 LAB — BPAM RBC
BLOOD PRODUCT EXPIRATION DATE: 201808022359
Blood Product Expiration Date: 201808022359
ISSUE DATE / TIME: 201807100902
ISSUE DATE / TIME: 201807101134
UNIT TYPE AND RH: 5100
Unit Type and Rh: 5100

## 2016-08-18 LAB — HAPTOGLOBIN: HAPTOGLOBIN: 299 mg/dL — AB (ref 34–200)

## 2016-08-18 LAB — HEMOGLOBIN A1C
HEMOGLOBIN A1C: 5.5 % (ref 4.8–5.6)
MEAN PLASMA GLUCOSE: 111 mg/dL

## 2016-08-18 LAB — HIV ANTIBODY (ROUTINE TESTING W REFLEX): HIV Screen 4th Generation wRfx: NONREACTIVE

## 2016-08-18 MED ORDER — HYDROCHLOROTHIAZIDE 12.5 MG PO CAPS: 25.0000 mg | ORAL_CAPSULE | Freq: Every day | ORAL | 1 refills | Status: DC

## 2016-08-18 MED ORDER — PREDNISONE 20 MG PO TABS: 40.0000 mg | ORAL_TABLET | Freq: Every day | ORAL | 0 refills | Status: AC

## 2016-08-18 MED ORDER — WHITE PETROLATUM GEL
Status: AC
  Administered 2016-08-18: 17:00:00
  Filled 2016-08-18: qty 1

## 2016-08-18 MED ORDER — FERUMOXYTOL INJECTION 510 MG/17 ML
510.0000 mg | Freq: Once | INTRAVENOUS | Status: AC
  Administered 2016-08-18: 510 mg via INTRAVENOUS
  Filled 2016-08-18 (×2): qty 17

## 2016-08-18 MED ORDER — POLYETHYLENE GLYCOL 3350 17 G PO PACK: 17.0000 g | PACK | Freq: Every day | ORAL | 0 refills | Status: DC | PRN

## 2016-08-18 NOTE — Discharge Instructions (Signed)
Please follow-up with your GI doctors in [place] for the Capsule Endoscopy   Please go to the ED if you experience any bloody bowel movements or being to vomit blood   If you experience further facial swelling please return to the ED  Gastrointestinal Bleeding Gastrointestinal bleeding is bleeding somewhere along the path food travels through the body (digestive tract). This path is anywhere between the mouth and the opening of the butt (anus). You may have blood in your poop (stools) or have black poop. If you throw up (vomit), there may be blood in it. This condition can be mild, serious, or even life-threatening. If you have a lot of bleeding, you may need to stay in the hospital. Follow these instructions at home:  Take over-the-counter and prescription medicines only as told by your doctor.  Eat foods that have a lot of fiber in them. These foods include whole grains, fruits, and vegetables. You can also try eating 1-3 prunes each day.  Drink enough fluid to keep your pee (urine) clear or pale yellow.  Keep all follow-up visits as told by your doctor. This is important. Contact a doctor if:  Your symptoms do not get better. Get help right away if:  Your bleeding gets worse.  You feel dizzy or you pass out (faint).  You feel weak.  You have very bad cramps in your back or belly (abdomen).  You pass large clumps of blood (clots) in your poop.  Your symptoms are getting worse. This information is not intended to replace advice given to you by your health care provider. Make sure you discuss any questions you have with your health care provider. Document Released: 11/04/2007 Document Revised: 07/03/2015 Document Reviewed: 07/15/2014 Elsevier Interactive Patient Education  2018 [employer]   Angioedema Angioedema is sudden swelling in the body. The swelling can happen in any part of the body. It often happens on the skin and causes itchy, bumpy patches (hives) to  form. This condition may:  Happen only one time.  Happen more than one time. It may come back at random times.  Keep coming back for a number of years. Someday it may stop coming back.  Follow these instructions at home:  Take over-the-counter and prescription medicines only as told by your doctor.  If you were given medicines for emergency allergy treatment, always carry them with you.  Wear a medical bracelet as told by your doctor.  Avoid the things that cause your attacks (triggers).  If this condition was passed to you from your parents and you want to have kids, talk to your doctor. Your kids may also have this condition. Contact a doctor if:  You have another attack.  Your attacks happen more often, even after you take steps to prevent them.  This condition was passed to you by your parents and you want to have kids. Get help right away if:  Your mouth, tongue, or lips get very swollen.  You have trouble breathing.  You have trouble swallowing.  You pass out (faint). This information is not intended to replace advice given to you by your health care provider. Make sure you discuss any questions you have with your health care provider. Document Released: 01/13/2009 Document Revised: 08/27/2015 Document Reviewed: 08/05/2015 Elsevier Interactive Patient Education  2017 [employer]

## 2016-08-18 NOTE — Care Management Obs Status (Signed)
MEDICARE OBSERVATION STATUS NOTIFICATION   Patient Details  Name: Dana EpleyRegina Nelson MRN: [id number] Date of Birth: July 13, 1945   Medicare Observation Status Notification Given:  Yes    Lawerance Sabalebbie Doneisha Ivey, RN 08/18/2016, 2:35 PM

## 2016-08-18 NOTE — Discharge Summary (Signed)
Name: Dana EpleyRegina Nelson MRN: [id number] DOB: 05-Jan-1946 71 y.o. PCP: Patient, No Pcp Per  Date of Admission: 08/17/2016  4:27 AM Date of Discharge: 08/18/2016 Attending Physician: Dana Nelson, Lawrence, MD  Discharge Diagnosis: 1. Angioedema  2. Symptomatic Anemia  3. DVT  4. Hypertension   Principal Problem:   Angioedema Active Problems:   Symptomatic anemia   DVT (deep venous thrombosis) (HCC)   HTN (hypertension)   Hyperlipidemia   Microcytic anemia   Hypothyroidism   GERD (gastroesophageal reflux disease)  Discharge Medications: Allergies as of 08/18/2016      Reactions   Peanut-containing Drug Products Other (See Comments)   Eczema    Penicillins Other (See Comments)   Childhood allergy       Medication List    STOP taking these medications   losartan-hydrochlorothiazide 100-25 MG tablet Commonly known as:  HYZAAR     TAKE these medications   acetaminophen 500 MG tablet Commonly known as:  TYLENOL Take 500-1,000 mg by mouth every 6 (six) hours as needed for mild pain.   allopurinol 100 MG tablet Commonly known as:  ZYLOPRIM Take 100 mg by mouth daily.   aspirin EC 81 MG tablet Take 81 mg by mouth daily.   ferrous gluconate 324 MG tablet Commonly known as:  FERGON Take 324 mg by mouth daily with breakfast.   furosemide 40 MG tablet Commonly known as:  LASIX Take 40 mg by mouth every other day.   hydrALAZINE 25 MG tablet Commonly known as:  APRESOLINE Take 25 mg by mouth 3 (three) times daily.   hydrochlorothiazide 12.5 MG capsule Commonly known as:  MICROZIDE Take 2 capsules (25 mg total) by mouth daily.   levothyroxine 100 MCG tablet Commonly known as:  SYNTHROID, LEVOTHROID Take 100 mcg by mouth daily before breakfast.   metFORMIN 750 MG 24 hr tablet Commonly known as:  GLUCOPHAGE-XR Take 750 mg by mouth 2 (two) times daily.   metoprolol succinate 50 MG 24 hr tablet Commonly known as:  TOPROL-XL Take 50 mg by mouth daily. Take with or  immediately following a meal.   omeprazole 20 MG capsule Commonly known as:  PRILOSEC Take 40 mg by mouth daily.   polyethylene glycol packet Commonly known as:  MIRALAX / GLYCOLAX Take 17 g by mouth daily as needed for mild constipation.   pravastatin 20 MG tablet Commonly known as:  PRAVACHOL Take 20 mg by mouth daily.   predniSONE 20 MG tablet Commonly known as:  DELTASONE Take 2 tablets (40 mg total) by mouth daily with breakfast. Start taking on:  08/19/2016   rivaroxaban 20 MG Tabs tablet Commonly known as:  XARELTO Take 20 mg by mouth daily.       Disposition and follow-up:   [name] [facility] in Stable condition.  At the hospital follow up visit please address:  1.   Angioedema  -- Any recurrent swelling of your lips?  Symptomatic Anemia  -- Any bloody stools? -- Any episodes of hematemesis?  -- Did you get the capsule endoscopy done?  DVT -- Current still taking the Xarelto?  2.  Labs / imaging needed at time of follow-up: Capsule Endoscopy, CBC  3.  Pending labs/ test needing follow-up: N/A  Follow-up Appointments: Follow-up with GI in [place] for Capsule Endoscopy   Hospital Course by problem list: Principal Problem:   Angioedema Active Problems:   Symptomatic anemia   DVT (deep venous thrombosis) (HCC)   HTN (hypertension)  Hyperlipidemia   Microcytic anemia   Hypothyroidism   GERD (gastroesophageal reflux disease)   1. Angioedema. Dana Nelson presented to the ED with acute swelling of her upper and lower lips. She states that she went to bed on 08/17/2016 and woke-up to find her upper lip extremely swollen, she took Benadryl and went back to bed. She woke up again with difficultly breathing and was taken to the ED. In the ED she was treated with Epi, Benadryl, Pepcid, and Methylprednisolone. The swelling subsided over the course of the next 24 hrs with no recurrent symptoms. Further work-up was not  pursued. It was felt to be due to an allergic reaction due to the rapid response to benadryl, epi, and pepcid. She was discharged with a 3 day course of Prednisone 40 mg (recieved 2 days in the hospital).   2. Symptomatic Anemia. Upon admission Dana Nelson's hgb was found to be 5.5. She received 2 units of PRBCs and her Hgb stabilized at 9.7-10.1. Work-up illustrated sever iron deficiency anemia. She had recently been worked-up for a possible GI bleed in [place] 3 weeks prior to admission. At the time she had an EGD and Colonoscopy that she reports were both unremarkable. She was transfused and given instruction to follow-up as an outpatient for capsule endoscopy but failed to do so. GI was consulted here at [facility] but felt it would be more appropriate for her to follow-up in [place] since they had no records and she does not live in [place]. It was discussed with Dana Nelson and she was agreeable to the plan. She was given IV Feraheme prior to discharge. It is possible that her angioedema and severe iron deficiency anemia are related. Literature supports malignancy as a cause of angioedema. Therefore, it is important she gets the capsule endoscopy to rule out GI malignancy.   3. DVT. Dana Nelson's possible GI bleed was complicated by the fact that she was recently diagnosed with a DVT and placed on Xarelto. The risks and benefits of continue Xarelto were discussed and she elected to continue therapy.   4. Hypertension. Due to the presenting angioedema and supporting literature of an ARBs implication in causing angioedema her Losartan was stopped. We continued her HCTZ 25 mg, Metoprolol 50 mg, and Hydralazine 25 mg TID.    Discharge Vitals:   BP (!) 168/113 (BP Location: Left Arm)   Pulse 64   Temp (!) 97.5 F (36.4 C) (Oral)   Resp 18   Ht 5\' 3"  (1.6 m)   Wt 172 lb 2.9 oz (78.1 kg)   SpO2 100%   BMI 30.50 kg/m   Pertinent Labs, Studies, and Procedures:  N/A  Discharge Instructions: Discharge  Instructions    Diet - low sodium heart healthy    Complete by:  As directed    Increase activity slowly    Complete by:  As directed       Signed: Levora Dredge, MD 08/18/2016, 3:29 PM   Pager: My Pager: [phone]

## 2016-08-18 NOTE — H&P (Signed)
Internal Medicine Attending Admission Note Date: 08/18/2016  Patient name: Dana Nelson Medical record number: [id number] Date of birth: Feb 25, 1945 Age: 71 y.o. Gender: female  I saw and evaluated the patient. I reviewed the resident's note and I agree with the resident's findings and plan as documented in the resident's note.  Chief Complaint(s): Lip swelling and dyspnea on exertion.  History - key components related to admission:  [name] is a 71 year old woman recently diagnosed with a left lower extremity deep venous thrombosis treated with Xarelto, eczema, hypertension, hyperlipidemia, and hypothyroidism who presents to the emergency department complaining of lip swelling that began the morning of admission. She went to bed in her usual state of health but started coughing and felt funny in the face. She saw that her lip was swollen when she looked in the mirror and she took a Benadryl. Three hours later the swelling began in the bottom lip, albeit to a lesser degree than the upper lip and her eyes were swollen. She therefore presented to the emergency department where she was diagnosed with angioedema and given Benadryl, Pepcid, Solu-Medrol, and epinephrine. Her symptoms improved but she was admitted to the internal medicine teaching service for further evaluation and care. She has no previous history of similar swelling and no family history of angioedema. She has chronic eczema and is allergic to peanut products. She is on Nelson ARB. She denies any recent trauma or exposure to any peanut products.  She was also found to have a hemoglobin of 6.3 with microcytosis. Apparently she required a blood transfusion approximately one year ago. She was hospitalized recently for Nelson unprovoked DVT and placed on Xarelto. About 3 weeks ago she noted blood on the toilet paper and on the stool. She presented to a local hospital in [place] where she received a transfusion and reports having undergone Nelson EGD and  colonoscopy. She was told the EGD was unremarkable and the colonoscopy had 2 polyps. It was recommended that she follow-up in [place] for a capsule endoscopy, but she failed to do so as she had to take care of family issues in [place]. She's had no bright red blood per rectum or tarry stools. She also denies any nonsteroidal anti-inflammatory use.  When seen on rounds the morning after admission she felt well with marked reduction in her lip swelling. She had no problems with her secretions, no problems with breathing, no stridor on examination. We had a long discussion about risks and benefits of continued Xarelto in the setting of a presumed GI bleed.  Physical Exam - key components related to admission:  Vitals:   08/17/16 2206 08/18/16 0505 08/18/16 0830 08/18/16 1715  BP: (!) 187/93 (!) 165/95 (!) 168/113 140/90  Pulse: 81 84 64 77  Resp: 18 18 18 18   Temp: 98.9 F (37.2 C) 97.6 F (36.4 C) (!) 97.5 F (36.4 C) 98.4 F (36.9 C)  TempSrc: Oral Oral Oral Oral  SpO2: 100% 100% 100% 100%  Weight: 172 lb 2.9 oz (78.1 kg)     Height:       Gen.: Well-developed, well-nourished, woman sitting comfortably in a chair eating breakfast in no acute distress. HEENT: Mild swelling of the upper lip. Lungs: There was no stridor present. Skin: Chronic eczematous changes.  Lab results:  Basic Metabolic Panel:  Recent Labs  40/98/11 0539 08/18/16 0500  NA 139 138  K 3.5 4.3  CL 107 107  CO2 22 21*  GLUCOSE 103* 109*  BUN 10 16  CREATININE  0.85 0.87  CALCIUM 9.1 9.6   Liver Function Tests:  Recent Labs  08/17/16 1828  AST 21  ALT 14  ALKPHOS 85  BILITOT 1.1  PROT 7.2  ALBUMIN 3.7   CBC:  Recent Labs  08/17/16 0539 08/17/16 1828 08/18/16 0500  WBC 7.6 8.5 10.2  NEUTROABS 4.6  --   --   HGB 6.3* 9.7* 10.1*  HCT 21.9* 31.2* 32.2*  MCV 72.3* 75.4* 75.9*  PLT 332 336 336   CBG:  Recent Labs  08/17/16 1218 08/17/16 1650 08/17/16 2155 08/18/16 0725  08/18/16 1201 08/18/16 1606  GLUCAP 259* 175* 187* 109* 146* 168*   Hemoglobin A1C:  Recent Labs  08/17/16 1828  HGBA1C 5.5   Anemia Panel:  Recent Labs  08/17/16 1828  FERRITIN 5*  TIBC 406  IRON 22*  RETICCTPCT 2.4   Urinalysis:  Clear, yellow, specific gravity 1.011, pH 5.0, negative nitrite, negative leukocytes, negative hemoglobin.  Misc. Labs:  Fecal occult blood test positive HIV antibody nonreactive LDH 156 Haptoglobin 299  Assessment & Plan by Problem:  [name] is a 71 year old woman recently diagnosed with a left lower extremity deep venous thrombosis treated with Xarelto, eczema, hypertension, hyperlipidemia, and hypothyroidism who presents to the emergency department complaining of lip swelling that began the morning of admission. This is most likely angioedema. The most likely cause is her losartan. There are no other obvious causes at this time assuming she has not been exposed to peanut products. She has responded well to medical therapy. She has a iron deficiency anemia with fecal occult blood testing positivity suggesting a GI source. Given her last transfusion was 3 weeks ago and she has not seen any bright red blood per rectum or melena, this is likely a slow ooze. The risks and benefits of continued Xarelto use in this setting was discussed and she chose to continue with Xarelto.  1) Angioedema: Likely secondary to the ARB. The losartan was discontinued.  2) Chronic GI bleed with iron deficiency anemia: As she is returning to [facility] tomorrow she was advised to follow-up with the capsule endoscopy that they wanted to perform there prior to her coming to [place]. The importance of getting the study done was stressed.  3) Iron deficiency anemia: During the admission she received 2 units of packed red blood cells and responded well. We also gave her a dose of IV iron to rapidly increase her iron stores.  4) Deep venous thrombosis of the left lower  extremity: She has chosen to remain on Xarelto after reviewing the risks and benefits of stopping.  5) Disposition: She is stable for discharge home today with [facility] and completion of the capsule endoscopy there.

## 2016-08-18 NOTE — Progress Notes (Signed)
Patient discharged to home with family. All belongings with patient , all instructions reviewed.

## 2016-08-18 NOTE — Progress Notes (Signed)
Internal Medicine Attending  Date: 08/18/2016  Patient name: Dana EpleyRegina Nelson Medical record number: [id number] Date of birth: [birth year] Age: 71 y.o. Gender: female  I saw and evaluated the patient. I reviewed the resident's note by Dr. Caron PresumeHelberg and I agree with the resident's findings and plans as documented in his progress note.  Please see my H&P dated 08/18/2016 for the specifics of my evaluation, assessment, and plan from earlier in the day.

## 2016-08-18 NOTE — Progress Notes (Signed)
Subjective: Dana Nelson is doing well over the interval. Facial swelling has continued to decrease. Denies any trouble breathing, issues swallowing food, or excessive drooling. This has never happened to her before and nobody in her family has had this before. She did not eat any new food or recently start any new medications.   3 weeks prior in [place] she had to receive a blood transfusion. At the time GI was consulted and preformed both an EGD and a colonoscopy. She reports no abnormalities. She was supposed to follow-up for capsule endoscopy but failed to do so due to several family issues. She will be going back to [facility] tomorrow and will be following up with GI at that time. Denies any blood in her stool, or vomiting blood.   Discussed risks/benefits to remaining on Xarelto. Patient elects to continue therapy.   Objective: Vital signs in last 24 hours: Vitals:   08/17/16 1728 08/17/16 2206 08/18/16 0505 08/18/16 0830  BP: (!) 175/83 (!) 187/93 (!) 165/95 (!) 168/113  Pulse: 84 81 84 64  Resp: 18 18 18 18   Temp: 98.6 F (37 C) 98.9 F (37.2 C) 97.6 F (36.4 C) (!) 97.5 F (36.4 C)  TempSrc: Oral Oral Oral Oral  SpO2: 100% 100% 100% 100%  Weight:  172 lb 2.9 oz (78.1 kg)    Height:       Physical Exam  Constitutional: She is oriented to person, place, and time and well-developed, well-nourished, and in no distress.  HENT:  Head: Normocephalic and atraumatic.  Eyes: Conjunctivae are normal. Pupils are equal, round, and reactive to light.  Cardiovascular: Normal rate, regular rhythm, normal heart sounds and intact distal pulses.  Exam reveals no friction rub.   No murmur heard. Pulmonary/Chest: Effort normal and breath sounds normal. She has no wheezes. She has no rales.  Abdominal: Soft. Bowel sounds are normal. She exhibits no distension. There is no tenderness. There is no rebound.  Musculoskeletal: Normal range of motion. She exhibits edema.  Neurological: She is alert and  oriented to person, place, and time.  Skin: Skin is warm and dry.  Psychiatric: Mood, memory, affect and judgment normal.   Assessment/Plan:  Principal Problem:   Lip swelling Active Problems:   Symptomatic anemia   DVT (deep venous thrombosis) (HCC)   HTN (hypertension)   Hyperlipidemia   Microcytic anemia   Hypothyroidism   GERD (gastroesophageal reflux disease)  1. Angioedema  - Felt to be allergic in nature due to the response to Benadryl  - Did not do any further work-up  - Stopped her Losartan  - Continue Prednisone 40mg  and Benadryl for 5 days  2. Symptomatic Anemia  - Hgb 5.5 on admission, s/p 2 units stable around 9.7  - FOBT x2 positive  - Labs indicative of severe iron deficiency anemia, will give IV Feraheme prior to discharge  - GI work-up in [place] with both EGD and colonoscopy (patient reported, no records received)  - Recommend capsule endoscopy  - GI called, feel that follow-up would be difficult since she does not live in [place]. Recommends discharge with follow-up in [place]  - Patient is agreeable and states that she will follow up in [place] for capsule endoscopy   3. DVT - Currently on Xarelto  - Discussed both risks and benefits to stopping Xarelto   4. HTN - DC Losartan due to angioedema  - Continue HCTZ 25mg , Hydralazine 25 mg TID, and Metoprolol 50 mg   Dispo: Anticipated discharge in approximately 0  day(s).   Levora DredgeHelberg, Lizabeth Fellner, MD 08/18/2016, 3:09 PM Pager: My Pager: [phone]

## 2016-11-08 DIAGNOSIS — Z9289 Personal history of other medical treatment: Secondary | ICD-10-CM

## 2016-11-08 HISTORY — DX: Personal history of other medical treatment: Z92.89

## 2016-12-04 ENCOUNTER — Inpatient Hospital Stay (HOSPITAL_COMMUNITY)
Admission: EM | Admit: 2016-12-04 | Discharge: 2016-12-10 | DRG: 812 | Disposition: A | Discharge status: Home Health | Payer: Medicare Other | Attending: Internal Medicine | Admitting: Internal Medicine

## 2016-12-04 ENCOUNTER — Inpatient Hospital Stay (HOSPITAL_COMMUNITY): Payer: Medicare Other

## 2016-12-04 ENCOUNTER — Encounter (HOSPITAL_COMMUNITY): Payer: Self-pay | Admitting: Emergency Medicine

## 2016-12-04 DIAGNOSIS — M25561 Pain in right knee: Secondary | ICD-10-CM

## 2016-12-04 DIAGNOSIS — R0602 Shortness of breath: Secondary | ICD-10-CM | POA: Diagnosis not present

## 2016-12-04 DIAGNOSIS — K219 Gastro-esophageal reflux disease without esophagitis: Secondary | ICD-10-CM | POA: Diagnosis not present

## 2016-12-04 DIAGNOSIS — R52 Pain, unspecified: Secondary | ICD-10-CM

## 2016-12-04 DIAGNOSIS — I10 Essential (primary) hypertension: Secondary | ICD-10-CM | POA: Diagnosis not present

## 2016-12-04 DIAGNOSIS — Z7982 Long term (current) use of aspirin: Secondary | ICD-10-CM | POA: Diagnosis not present

## 2016-12-04 DIAGNOSIS — K625 Hemorrhage of anus and rectum: Secondary | ICD-10-CM | POA: Diagnosis not present

## 2016-12-04 DIAGNOSIS — Z880 Allergy status to penicillin: Secondary | ICD-10-CM

## 2016-12-04 DIAGNOSIS — Z888 Allergy status to other drugs, medicaments and biological substances status: Secondary | ICD-10-CM

## 2016-12-04 DIAGNOSIS — Z79899 Other long term (current) drug therapy: Secondary | ICD-10-CM | POA: Diagnosis not present

## 2016-12-04 DIAGNOSIS — E876 Hypokalemia: Secondary | ICD-10-CM | POA: Diagnosis present

## 2016-12-04 DIAGNOSIS — E1165 Type 2 diabetes mellitus with hyperglycemia: Secondary | ICD-10-CM | POA: Diagnosis present

## 2016-12-04 DIAGNOSIS — I82409 Acute embolism and thrombosis of unspecified deep veins of unspecified lower extremity: Secondary | ICD-10-CM | POA: Diagnosis present

## 2016-12-04 DIAGNOSIS — E785 Hyperlipidemia, unspecified: Secondary | ICD-10-CM | POA: Diagnosis present

## 2016-12-04 DIAGNOSIS — R42 Dizziness and giddiness: Secondary | ICD-10-CM | POA: Diagnosis present

## 2016-12-04 DIAGNOSIS — I82402 Acute embolism and thrombosis of unspecified deep veins of left lower extremity: Secondary | ICD-10-CM | POA: Diagnosis not present

## 2016-12-04 DIAGNOSIS — Z86718 Personal history of other venous thrombosis and embolism: Secondary | ICD-10-CM

## 2016-12-04 DIAGNOSIS — D62 Acute posthemorrhagic anemia: Secondary | ICD-10-CM | POA: Diagnosis not present

## 2016-12-04 DIAGNOSIS — K921 Melena: Secondary | ICD-10-CM | POA: Diagnosis not present

## 2016-12-04 DIAGNOSIS — J45909 Unspecified asthma, uncomplicated: Secondary | ICD-10-CM | POA: Diagnosis present

## 2016-12-04 DIAGNOSIS — R0600 Dyspnea, unspecified: Secondary | ICD-10-CM | POA: Diagnosis not present

## 2016-12-04 DIAGNOSIS — K922 Gastrointestinal hemorrhage, unspecified: Secondary | ICD-10-CM | POA: Diagnosis present

## 2016-12-04 DIAGNOSIS — Z7901 Long term (current) use of anticoagulants: Secondary | ICD-10-CM

## 2016-12-04 DIAGNOSIS — Z7984 Long term (current) use of oral hypoglycemic drugs: Secondary | ICD-10-CM

## 2016-12-04 DIAGNOSIS — D509 Iron deficiency anemia, unspecified: Secondary | ICD-10-CM | POA: Diagnosis not present

## 2016-12-04 DIAGNOSIS — Z91018 Allergy to other foods: Secondary | ICD-10-CM

## 2016-12-04 DIAGNOSIS — M25551 Pain in right hip: Secondary | ICD-10-CM | POA: Diagnosis present

## 2016-12-04 DIAGNOSIS — K558 Other vascular disorders of intestine: Secondary | ICD-10-CM | POA: Diagnosis not present

## 2016-12-04 DIAGNOSIS — M1990 Unspecified osteoarthritis, unspecified site: Secondary | ICD-10-CM | POA: Diagnosis present

## 2016-12-04 DIAGNOSIS — E039 Hypothyroidism, unspecified: Secondary | ICD-10-CM | POA: Diagnosis present

## 2016-12-04 DIAGNOSIS — M109 Gout, unspecified: Secondary | ICD-10-CM | POA: Diagnosis present

## 2016-12-04 DIAGNOSIS — Z9114 Patient's other noncompliance with medication regimen: Secondary | ICD-10-CM | POA: Diagnosis not present

## 2016-12-04 DIAGNOSIS — K5520 Angiodysplasia of colon without hemorrhage: Secondary | ICD-10-CM | POA: Diagnosis present

## 2016-12-04 DIAGNOSIS — D649 Anemia, unspecified: Secondary | ICD-10-CM

## 2016-12-04 DIAGNOSIS — E038 Other specified hypothyroidism: Secondary | ICD-10-CM | POA: Diagnosis not present

## 2016-12-04 DIAGNOSIS — D500 Iron deficiency anemia secondary to blood loss (chronic): Secondary | ICD-10-CM | POA: Diagnosis not present

## 2016-12-04 DIAGNOSIS — M170 Bilateral primary osteoarthritis of knee: Secondary | ICD-10-CM | POA: Diagnosis not present

## 2016-12-04 HISTORY — DX: Unspecified open-angle glaucoma, stage unspecified: H40.10X0

## 2016-12-04 HISTORY — DX: Angioneurotic edema, initial encounter: T78.3XXA

## 2016-12-04 HISTORY — DX: Dermatitis, unspecified: L30.9

## 2016-12-04 HISTORY — DX: Unspecified osteoarthritis, unspecified site: M19.90

## 2016-12-04 HISTORY — DX: Angiodysplasia of colon without hemorrhage: K55.20

## 2016-12-04 HISTORY — DX: Vitamin D deficiency, unspecified: E55.9

## 2016-12-04 HISTORY — DX: Hypothyroidism, unspecified: E03.9

## 2016-12-04 HISTORY — DX: Gastrointestinal hemorrhage, unspecified: K92.2

## 2016-12-04 HISTORY — DX: Melena: K92.1

## 2016-12-04 HISTORY — DX: Unspecified asthma, uncomplicated: J45.909

## 2016-12-04 HISTORY — DX: Gout, unspecified: M10.9

## 2016-12-04 HISTORY — DX: Iron deficiency anemia, unspecified: D50.9

## 2016-12-04 LAB — CBC
HCT: 14.6 % — ABNORMAL LOW (ref 36.0–46.0)
HEMATOCRIT: 23.4 % — AB (ref 36.0–46.0)
HEMOGLOBIN: 7.7 g/dL — AB (ref 12.0–15.0)
Hemoglobin: 4.3 g/dL — CL (ref 12.0–15.0)
MCH: 21.7 pg — AB (ref 26.0–34.0)
MCH: 25.7 pg — ABNORMAL LOW (ref 26.0–34.0)
MCHC: 29.5 g/dL — ABNORMAL LOW (ref 30.0–36.0)
MCHC: 32.9 g/dL (ref 30.0–36.0)
MCV: 73.7 fL — AB (ref 78.0–100.0)
MCV: 78 fL (ref 78.0–100.0)
PLATELETS: 618 10*3/uL — AB (ref 150–400)
Platelets: 392 10*3/uL (ref 150–400)
RBC: 1.98 MIL/uL — AB (ref 3.87–5.11)
RBC: 3 MIL/uL — AB (ref 3.87–5.11)
RDW: 16.2 % — AB (ref 11.5–15.5)
RDW: 16 % — ABNORMAL HIGH (ref 11.5–15.5)
WBC: 12 10*3/uL — AB (ref 4.0–10.5)
WBC: 8.9 10*3/uL (ref 4.0–10.5)

## 2016-12-04 LAB — COMPREHENSIVE METABOLIC PANEL
ALK PHOS: 75 U/L (ref 38–126)
ALT: 11 U/L — AB (ref 14–54)
AST: 20 U/L (ref 15–41)
Albumin: 2.9 g/dL — ABNORMAL LOW (ref 3.5–5.0)
Anion gap: 16 — ABNORMAL HIGH (ref 5–15)
BUN: 18 mg/dL (ref 6–20)
CALCIUM: 8.8 mg/dL — AB (ref 8.9–10.3)
CHLORIDE: 106 mmol/L (ref 101–111)
CO2: 19 mmol/L — AB (ref 22–32)
Creatinine, Ser: 1.1 mg/dL — ABNORMAL HIGH (ref 0.44–1.00)
GFR, EST AFRICAN AMERICAN: 58 mL/min — AB (ref >60)
GFR, EST NON AFRICAN AMERICAN: 50 mL/min — AB (ref >60)
Glucose, Bld: 137 mg/dL — ABNORMAL HIGH (ref 65–99)
Potassium: 3.1 mmol/L — ABNORMAL LOW (ref 3.5–5.1)
Sodium: 141 mmol/L (ref 135–145)
Total Bilirubin: 0.5 mg/dL (ref 0.3–1.2)
Total Protein: 6.5 g/dL (ref 6.5–8.1)

## 2016-12-04 LAB — DIFFERENTIAL
BASOS ABS: 0.1 10*3/uL (ref 0.0–0.1)
Basophils Relative: 0 %
EOS ABS: 0.1 10*3/uL (ref 0.0–0.7)
Eosinophils Relative: 0 %
Lymphocytes Relative: 20 %
Lymphs Abs: 2.3 10*3/uL (ref 0.7–4.0)
MONO ABS: 0.8 10*3/uL (ref 0.1–1.0)
Monocytes Relative: 7 %
NEUTROS PCT: 73 %
Neutro Abs: 8.6 10*3/uL — ABNORMAL HIGH (ref 1.7–7.7)

## 2016-12-04 LAB — POC OCCULT BLOOD, ED: FECAL OCCULT BLD: POSITIVE — AB

## 2016-12-04 LAB — GLUCOSE, CAPILLARY
GLUCOSE-CAPILLARY: 84 mg/dL (ref 65–99)
GLUCOSE-CAPILLARY: 92 mg/dL (ref 65–99)

## 2016-12-04 LAB — PROTIME-INR
INR: 1.4
Prothrombin Time: 17.1 seconds — ABNORMAL HIGH (ref 11.4–15.2)

## 2016-12-04 LAB — TSH: TSH: 0.359 u[IU]/mL (ref 0.350–4.500)

## 2016-12-04 LAB — PREPARE RBC (CROSSMATCH)

## 2016-12-04 LAB — LIPASE, BLOOD: Lipase: 16 U/L (ref 11–51)

## 2016-12-04 MED ORDER — POTASSIUM CHLORIDE CRYS ER 20 MEQ PO TBCR
40.0000 meq | EXTENDED_RELEASE_TABLET | Freq: Once | ORAL | Status: AC
  Administered 2016-12-04: 40 meq via ORAL
  Filled 2016-12-04: qty 2

## 2016-12-04 MED ORDER — FERROUS GLUCONATE 324 (38 FE) MG PO TABS
324.0000 mg | ORAL_TABLET | Freq: Every day | ORAL | Status: DC
  Administered 2016-12-05: 324 mg via ORAL
  Filled 2016-12-04: qty 1

## 2016-12-04 MED ORDER — LEVOTHYROXINE SODIUM 100 MCG PO TABS
100.0000 ug | ORAL_TABLET | Freq: Every day | ORAL | Status: DC
  Administered 2016-12-05: 100 ug via ORAL
  Filled 2016-12-04: qty 1

## 2016-12-04 MED ORDER — PRAVASTATIN SODIUM 20 MG PO TABS
20.0000 mg | ORAL_TABLET | Freq: Every day | ORAL | Status: DC
  Administered 2016-12-05 – 2016-12-09 (×5): 20 mg via ORAL
  Filled 2016-12-04 (×6): qty 1

## 2016-12-04 MED ORDER — INSULIN ASPART 100 UNIT/ML ~~LOC~~ SOLN
0.0000 [IU] | SUBCUTANEOUS | Status: DC
  Administered 2016-12-06 – 2016-12-09 (×5): 1 [IU] via SUBCUTANEOUS
  Administered 2016-12-09: 2 [IU] via SUBCUTANEOUS

## 2016-12-04 MED ORDER — ONDANSETRON HCL 4 MG PO TABS: 4.0000 mg | ORAL_TABLET | Freq: Four times a day (QID) | ORAL | Status: DC | PRN

## 2016-12-04 MED ORDER — FENTANYL CITRATE (PF) 100 MCG/2ML IJ SOLN
12.5000 ug | Freq: Once | INTRAMUSCULAR | Status: AC
  Administered 2016-12-04: 12.5 ug via INTRAVENOUS
  Filled 2016-12-04: qty 2

## 2016-12-04 MED ORDER — SODIUM CHLORIDE 0.9 % IV SOLN
INTRAVENOUS | Status: AC
  Administered 2016-12-04: 21:00:00 via INTRAVENOUS

## 2016-12-04 MED ORDER — PANTOPRAZOLE SODIUM 40 MG IV SOLR
80.0000 mg | Freq: Once | INTRAVENOUS | Status: AC
  Administered 2016-12-04: 15:00:00 80 mg via INTRAVENOUS
  Filled 2016-12-04: qty 80

## 2016-12-04 MED ORDER — PANTOPRAZOLE SODIUM 40 MG IV SOLR
8.0000 mg/h | INTRAVENOUS | Status: DC
  Administered 2016-12-04 – 2016-12-07 (×6): 8 mg/h via INTRAVENOUS
  Filled 2016-12-04 (×11): qty 80

## 2016-12-04 MED ORDER — TRAZODONE HCL 50 MG PO TABS
25.0000 mg | ORAL_TABLET | Freq: Every evening | ORAL | Status: DC | PRN
  Filled 2016-12-04: qty 1

## 2016-12-04 MED ORDER — METOPROLOL SUCCINATE ER 50 MG PO TB24
50.0000 mg | ORAL_TABLET | Freq: Every day | ORAL | Status: DC
  Administered 2016-12-04 – 2016-12-10 (×7): 50 mg via ORAL
  Filled 2016-12-04 (×8): qty 1

## 2016-12-04 MED ORDER — ONDANSETRON HCL 4 MG/2ML IJ SOLN: 4.0000 mg | Freq: Four times a day (QID) | INTRAMUSCULAR | Status: DC | PRN

## 2016-12-04 MED ORDER — ACETAMINOPHEN 325 MG PO TABS: 650.0000 mg | ORAL_TABLET | Freq: Four times a day (QID) | ORAL | Status: DC | PRN

## 2016-12-04 MED ORDER — ACETAMINOPHEN 650 MG RE SUPP: 650.0000 mg | Freq: Four times a day (QID) | RECTAL | Status: DC | PRN

## 2016-12-04 MED ORDER — SODIUM CHLORIDE 0.9 % IV SOLN
Freq: Once | INTRAVENOUS | Status: AC
  Administered 2016-12-04: 14:00:00 via INTRAVENOUS

## 2016-12-04 MED ORDER — ALLOPURINOL 100 MG PO TABS
100.0000 mg | ORAL_TABLET | Freq: Every day | ORAL | Status: DC
  Administered 2016-12-04 – 2016-12-10 (×7): 100 mg via ORAL
  Filled 2016-12-04 (×8): qty 1

## 2016-12-04 NOTE — ED Notes (Signed)
Delay in lab draw Pt receiving blood produts.

## 2016-12-04 NOTE — ED Provider Notes (Signed)
[facility] EMERGENCY DEPARTMENT Provider Note   CSN: [id number] Arrival date & time: 12/04/16  1237     History   Chief Complaint Chief Complaint  Patient presents with  . Blood In Stools  . Weakness    HPI Dana Nelson is a 71 y.o. female.  HPI   71 year old female with a history hypertension, hyperlipidemia, diabetes, angioedema, DVT on Xarelto, prior GI bleed of unknown etiology for which she was seen in [place], presents with concern for generalized weakness and bloody bowel movements.  Reports bloody bowel movements began yesterday.  Describes them as black and tarry, however reports she has had episodes where she will developed abdominal cramping followed by episode of passing dark blood.  Has had 2 episodes today.  Reports feeling lightheaded and shortness of breath.  Reports she has had blood transfusions in the past for GI bleed.  She is unclear she has a history of diverticulosis however states this is possible.  Denies history of varices, peptic ulcer disease, alcohol use, NSAID use.  Reports she does have epigastric pain that she describes as cramping prior to having episodes of bloody stool.  She takes Xarelto as prescribed, with most recent dose this morning.  She is living in [place], however is now has moved here  Past Medical History:  Diagnosis Date  . Angioedema 2018  . Asthma   . Diabetes mellitus without complication (HCC)   . DVT (deep venous thrombosis) (HCC) 08/2016  . Eczema   . Gout   . Hyperlipemia   . Hypertension   . Hypothyroidism   . Iron deficiency anemia   . Lip swelling 08/2016  . Open-angle glaucoma   . Osteoarthritis   . Vitamin D deficiency     Patient Active Problem List   Diagnosis Date Noted  . Hypokalemia 12/04/2016  . Melena 12/04/2016  . GI bleed 12/04/2016  . Hematochezia   . Acute blood loss anemia   . Chronic iron deficiency anemia   . Angioedema 08/17/2016  . Symptomatic anemia 08/17/2016  . DVT  (deep venous thrombosis) (HCC) 08/17/2016  . HTN (hypertension) 08/17/2016  . Hyperlipidemia 08/17/2016  . Microcytic anemia 08/17/2016  . Hypothyroidism 08/17/2016  . GERD (gastroesophageal reflux disease) 08/17/2016    Past Surgical History:  Procedure Laterality Date  . ABDOMINAL HYSTERECTOMY    . COLONOSCOPY  2018  . ESOPHAGOGASTRODUODENOSCOPY  2018  . GIVENS CAPSULE STUDY  2018  . THYROIDECTOMY      OB History    No data available       Home Medications    Prior to Admission medications   Medication Sig Start Date End Date Taking? Authorizing Provider  acetaminophen (TYLENOL) 500 MG tablet Take 500-1,000 mg by mouth every 6 (six) hours as needed for mild pain.    [provider]  allopurinol (ZYLOPRIM) 100 MG tablet Take 100 mg by mouth daily.    [provider]  aspirin EC 81 MG tablet Take 81 mg by mouth daily.    [provider]  ferrous gluconate (FERGON) 324 MG tablet Take 324 mg by mouth daily with breakfast.    [provider]  furosemide (LASIX) 40 MG tablet Take 40 mg by mouth every other day.     [provider]  hydrALAZINE (APRESOLINE) 25 MG tablet Take 25 mg by mouth 3 (three) times daily.    [provider]  hydrochlorothiazide (MICROZIDE) 12.5 MG capsule Take 2 capsules (25 mg total) by mouth daily.  08/18/16 10/17/16  Levora Dredge, MD  levothyroxine (SYNTHROID, LEVOTHROID) 100 MCG tablet Take 100 mcg by mouth daily before breakfast.    [provider]  metFORMIN (GLUCOPHAGE-XR) 750 MG 24 hr tablet Take 750 mg by mouth 2 (two) times daily.    [provider]  metoprolol succinate (TOPROL-XL) 50 MG 24 hr tablet Take 50 mg by mouth daily. Take with or immediately following a meal.    [provider]  omeprazole (PRILOSEC) 20 MG capsule Take 40 mg by mouth daily.    [provider]  polyethylene glycol (MIRALAX / GLYCOLAX) packet Take 17 g by mouth daily as needed for mild  constipation. 08/18/16   Levora Dredge, MD  pravastatin (PRAVACHOL) 20 MG tablet Take 20 mg by mouth daily.    [provider]  rivaroxaban (XARELTO) 20 MG TABS tablet Take 20 mg by mouth daily.    [provider]    Family History History reviewed. No pertinent family history.  Social History Social History  Substance Use Topics  . Smoking status: Never Smoker  . Smokeless tobacco: Never Used  . Alcohol use No     Allergies   Losartan; Peanut-containing drug products; and Penicillins   Review of Systems Review of Systems  Constitutional: Positive for fatigue. Negative for fever.  HENT: Negative for sore throat.   Eyes: Negative for visual disturbance.  Respiratory: Positive for shortness of breath. Negative for cough.   Cardiovascular: Negative for chest pain.  Gastrointestinal: Positive for blood in stool. Negative for abdominal pain, nausea and vomiting.  Genitourinary: Negative for difficulty urinating.  Musculoskeletal: Negative for back pain and neck pain.  Skin: Negative for rash.  Neurological: Positive for light-headedness. Negative for syncope and headaches.     Physical Exam Updated Vital Signs BP (!) 151/92   Pulse 89   Temp 97.8 F (36.6 C) (Oral)   Resp (!) 22   SpO2 100%   Physical Exam  Constitutional: She is oriented to person, place, and time. She appears well-developed and well-nourished. She appears ill. No distress.  HENT:  Head: Normocephalic and atraumatic.  Eyes: Conjunctivae and EOM are normal.  Neck: Normal range of motion.  Cardiovascular: Regular rhythm, normal heart sounds and intact distal pulses.  Tachycardia present.  Exam reveals no gallop and no friction rub.   No murmur heard. Pulmonary/Chest: Effort normal and breath sounds normal. No respiratory distress. She has no wheezes. She has no rales.  Abdominal: Soft. She exhibits no distension. There is no tenderness. There is no guarding.  Musculoskeletal: She  exhibits no edema or tenderness.  Neurological: She is alert and oriented to person, place, and time.  Skin: Skin is warm and dry. No rash noted. She is not diaphoretic. No erythema. There is pallor.  Nursing note and vitals reviewed.    ED Treatments / Results  Labs (all labs ordered are listed, but only abnormal results are displayed) Labs Reviewed  COMPREHENSIVE METABOLIC PANEL - Abnormal; Notable for the following:       Result Value   Potassium 3.1 (*)    CO2 19 (*)    Glucose, Bld 137 (*)    Creatinine, Ser 1.10 (*)    Calcium 8.8 (*)    Albumin 2.9 (*)    ALT 11 (*)    GFR calc non Af Amer 50 (*)    GFR calc Af Amer 58 (*)    Anion gap 16 (*)    All other components within normal limits  CBC - Abnormal; Notable for the following:    WBC 12.0 (*)    RBC 1.98 (*)    Hemoglobin 4.3 (*)    HCT 14.6 (*)    MCV 73.7 (*)    MCH 21.7 (*)    MCHC 29.5 (*)    RDW 16.2 (*)    Platelets 618 (*)    All other components within normal limits  DIFFERENTIAL - Abnormal; Notable for the following:    Neutro Abs 8.6 (*)    All other components within normal limits  POC OCCULT BLOOD, ED - Abnormal; Notable for the following:    Fecal Occult Bld POSITIVE (*)    All other components within normal limits  LIPASE, BLOOD  PROTIME-INR  TSH  CBC  BASIC METABOLIC PANEL  CBC  TYPE AND SCREEN  PREPARE RBC (CROSSMATCH)    EKG  EKG Interpretation  Date/Time:  Saturday December 04 2016 12:43:15 EDT Ventricular Rate:  109 PR Interval:  136 QRS Duration: 128 QT Interval:  388 QTC Calculation: 522 R Axis:   -35 Text Interpretation:  Sinus tachycardia Left axis deviation Right bundle branch block Abnormal ECG No previous ECGs available Confirmed by Alvira MondaySchlossman, Nakya Weyand ([id number]) on 12/04/2016 6:07:36 PM       Radiology Portable Chest 1 View  Result Date: 12/04/2016 CLINICAL DATA:  Dyspnea which shortness-of-breath, lightheadedness and weakness today. EXAM: PORTABLE CHEST 1 VIEW  COMPARISON:  None. FINDINGS: Lungs are adequately inflated without focal consolidation or effusion. Cardiomediastinal silhouette is within normal. There is calcified plaque over the thoracic aorta. There are degenerative changes of the spine and shoulders. IMPRESSION: No acute cardiopulmonary disease. Electronically Signed   By: Elberta Fortisaniel  Boyle M.D.   On: 12/04/2016 17:14    Procedures Procedures (including critical care time)  Medications Ordered in ED Medications  pantoprazole (PROTONIX) 80 mg in sodium chloride 0.9 % 250 mL (0.32 mg/mL) infusion (8 mg/hr Intravenous New Bag/Given 12/04/16 1514)  allopurinol (ZYLOPRIM) tablet 100 mg (not administered)  ferrous gluconate (FERGON) tablet 324 mg (not administered)  levothyroxine (SYNTHROID, LEVOTHROID) tablet 100 mcg (not administered)  pravastatin (PRAVACHOL) tablet 20 mg (not administered)  0.9 %  sodium chloride infusion (not administered)  acetaminophen (TYLENOL) tablet 650 mg (not administered)    Or  acetaminophen (TYLENOL) suppository 650 mg (not administered)  traZODone (DESYREL) tablet 25 mg (not administered)  ondansetron (ZOFRAN) tablet 4 mg (not administered)    Or  ondansetron (ZOFRAN) injection 4 mg (not administered)  potassium chloride SA (K-DUR,KLOR-CON) CR tablet 40 mEq (not administered)  insulin aspart (novoLOG) injection 0-9 Units (not administered)  metoprolol succinate (TOPROL-XL) 24 hr tablet 50 mg (not administered)  0.9 %  sodium chloride infusion ( Intravenous New Bag/Given 12/04/16 1422)  pantoprazole (PROTONIX) 80 mg in sodium chloride 0.9 % 100 mL IVPB (0 mg Intravenous Stopped 12/04/16 1509)  fentaNYL (SUBLIMAZE) injection 12.5 mcg (12.5 mcg Intravenous Given 12/04/16 1506)   CRITICAL CARE: Gi bleed, anemia requiring transfusion in ED Performed by: Lynnea FerrierErin E Porschea Borys   Total critical care time: 30 minutes  Critical care time was exclusive of separately billable procedures and treating other  patients.  Critical care was necessary to treat or prevent imminent or life-threatening deterioration.  Critical care was time spent personally by me on the following activities: development of treatment plan with patient and/or surrogate as well as nursing, discussions with consultants, evaluation of patient's response to treatment, examination of patient, obtaining history from patient or surrogate, ordering and performing treatments and interventions,  ordering and review of laboratory studies, ordering and review of radiographic studies, pulse oximetry and re-evaluation of patient's condition.   Initial Impression / Assessment and Plan / ED Course  I have reviewed the triage vital signs and the nursing notes.  Pertinent labs & imaging results that were available during my care of the patient were reviewed by me and considered in my medical decision making (see chart for details).     71 year old female with a history hypertension, hyperlipidemia, diabetes, angioedema, DVT on Xarelto, prior GI bleed of unknown etiology for which she was seen in [place], presents with concern for generalized weakness and bloody bowel movements.  On exam, stool is a dark maroon color.  Unclear by history and exam if this is an upper or lower GI bleed, possible brisk upper GI bleed versus other lower.  Patient was given IV Protonix bolus and drip.  Hemoglobin is 4, with most recent 10 in July.  She is tachycardic, but blood pressures are above 100 systolic.  Ordered 3 units of PRBCs.  Patient with improvement following blood transfusions, with normal blood pressures, normalization of heart rate, and feeling improved.   Discussed with Dr. Leone [facility] who evaluated patient at bedside.  Will admit to hospitalist for continued care.   Final Clinical Impressions(s) / ED Diagnoses   Final diagnoses:  Gastrointestinal hemorrhage, unspecified gastrointestinal hemorrhage type  Blood loss anemia    New  Prescriptions New Prescriptions   No medications on file     Alvira Monday, MD 12/04/16 1807

## 2016-12-04 NOTE — H&P (Signed)
History and Physical    Dana EpleyRegina Nelson ZDG:[id number] DOB: 03/21/1945 DOA: 12/04/2016  PCP: Patient, No Pcp Per Patient coming from: home  Chief Complaint: melena  HPI: Dana EpleyRegina Nelson is a very pleasant 71 y.o. female with medical history significant for DVT recently on Xarelto, hypertension, hyperlipidemia, hypothyroidism, chronic GI bleed with iron deficiency anemia presents to emergency Department chief complaint melena. Initial evaluation reveals hemoglobin of 4.3. Right hospitalists are asked to admit.  Information is obtained from the patient and the chart. Patient reports generalized weakness onset yesterday along with dark maroon stool. Associated symptoms include dizziness nausea without vomiting shortness of breath with exertion. She also reports some noncompliance with her home medications except for the Xarelto. Patient denies headache visual disturbances syncope or near-syncope. She denies chest pain palpitation, abdominal pain dysuria hematuria frequency or urgency. She denies any lower extremity edema. He states she just relocated from [place] where she had a capsule study that indicated "lower part of my stomach inflamed". Reports she's had an EGD this year when that was unremarkable as well as a colonoscopy that had to polyps and diverticulosis. Was admitted in July for angioedema and incidental finding was a hemoglobin of 6.3 at which time she underwent transfusion.   ED Course: In the emergency department she's afebrile hemodynamically stable not hypoxic. She is provided with 3 units of packed red blood cells.  Review of Systems: As per HPI otherwise all other systems reviewed and are negative.   Ambulatory Status: Relates independently with a steady gait no recent falls  Past Medical History:  Diagnosis Date  . Diabetes mellitus without complication (HCC)   . DVT (deep venous thrombosis) (HCC) 08/2016  . Family history of adverse reaction to anesthesia    " my daughter had  a hard time waking up "  . Hyperlipemia   . Hypertension   . Lip swelling 08/2016  . Thyroid disease     Past Surgical History:  Procedure Laterality Date  . ABDOMINAL HYSTERECTOMY    . THYROIDECTOMY      Social History   Social History  . Marital status: Single    Spouse name: N/A  . Number of children: N/A  . Years of education: N/A   Occupational History  . Not on file.   Social History Main Topics  . Smoking status: Never Smoker  . Smokeless tobacco: Never Used  . Alcohol use No  . Drug use: No  . Sexual activity: Not on file   Other Topics Concern  . Not on file   Social History Narrative  . No narrative on file    Allergies  Allergen Reactions  . Losartan Swelling    Swelling of lips and face  . Peanut-Containing Drug Products Other (See Comments)    Eczema   . Penicillins Other (See Comments)    Childhood allergy     No family history on file.  Prior to Admission medications   Medication Sig Start Date End Date Taking? Authorizing Provider  acetaminophen (TYLENOL) 500 MG tablet Take 500-1,000 mg by mouth every 6 (six) hours as needed for mild pain.    [provider]  allopurinol (ZYLOPRIM) 100 MG tablet Take 100 mg by mouth daily.    [provider]  aspirin EC 81 MG tablet Take 81 mg by mouth daily.    [provider]  ferrous gluconate (FERGON) 324 MG tablet Take 324 mg by mouth daily with breakfast.    [provider]  furosemide (LASIX)  40 MG tablet Take 40 mg by mouth every other day.     [provider]  hydrALAZINE (APRESOLINE) 25 MG tablet Take 25 mg by mouth 3 (three) times daily.    [provider]  hydrochlorothiazide (MICROZIDE) 12.5 MG capsule Take 2 capsules (25 mg total) by mouth daily. 08/18/16 10/17/16  Levora Dredge, MD  levothyroxine (SYNTHROID, LEVOTHROID) 100 MCG tablet Take 100 mcg by mouth daily before breakfast.    [provider]  metFORMIN (GLUCOPHAGE-XR) 750 MG  24 hr tablet Take 750 mg by mouth 2 (two) times daily.    [provider]  metoprolol succinate (TOPROL-XL) 50 MG 24 hr tablet Take 50 mg by mouth daily. Take with or immediately following a meal.    [provider]  omeprazole (PRILOSEC) 20 MG capsule Take 40 mg by mouth daily.    [provider]  polyethylene glycol (MIRALAX / GLYCOLAX) packet Take 17 g by mouth daily as needed for mild constipation. 08/18/16   Levora Dredge, MD  pravastatin (PRAVACHOL) 20 MG tablet Take 20 mg by mouth daily.    [provider]  rivaroxaban (XARELTO) 20 MG TABS tablet Take 20 mg by mouth daily.    [provider]    Physical Exam: Vitals:   12/04/16 1539 12/04/16 1540 12/04/16 1600 12/04/16 1630  BP:  (!) 141/77 (!) 147/90 (!) 163/84  Pulse: 96 93 95 98  Resp: 17 12 18 19   Temp:  97.8 F (36.6 C)    TempSrc:  Oral    SpO2: 100% 99% 100% 100%     General:  Appears calm and comfortable sitting up in bed in no acute distress Eyes:  PERRL, EOMI, normal lids, iris ENT:  grossly normal hearing, lips & tongue, his membranes of her mouth are pale and dry Neck:  no LAD, masses or thyromegaly Cardiovascular:  RRR, no m/r/g. No LE edema. Pedal pulses present and palpable Respiratory:  CTA bilaterally, no w/r/r. Normal respiratory effort. Abdomen:  soft, ntnd, positive bowel sounds throughout no guarding or rebounding Skin:  no rash or induration seen on limited exam Musculoskeletal:  grossly normal tone BUE/BLE, good ROM, no bony abnormality Psychiatric:  grossly normal mood and affect, speech fluent and appropriate, AOx3 Neurologic:  CN 2-12 grossly intact, moves all extremities in coordinated fashion, sensation intact each clear facial symmetry  Labs on Admission: I have personally reviewed following labs and imaging studies  CBC:  Recent Labs Lab 12/04/16 1251  WBC 12.0*  NEUTROABS PENDING  HGB 4.3*  HCT 14.6*  MCV 73.7*  PLT 618*   Basic  Metabolic Panel:  Recent Labs Lab 12/04/16 1251  NA 141  K 3.1*  CL 106  CO2 19*  GLUCOSE 137*  BUN 18  CREATININE 1.10*  CALCIUM 8.8*   GFR: CrCl cannot be calculated (Unknown ideal weight.). Liver Function Tests:  Recent Labs Lab 12/04/16 1251  AST 20  ALT 11*  ALKPHOS 75  BILITOT 0.5  PROT 6.5  ALBUMIN 2.9*    Recent Labs Lab 12/04/16 1251  LIPASE 16   No results for input(s): AMMONIA in the last 168 hours. Coagulation Profile: No results for input(s): INR, PROTIME in the last 168 hours. Cardiac Enzymes: No results for input(s): CKTOTAL, CKMB, CKMBINDEX, TROPONINI in the last 168 hours. BNP (last 3 results) No results for input(s): PROBNP in the last 8760 hours. HbA1C: No results for input(s): HGBA1C in the last 72 hours. CBG: No results for input(s): GLUCAP in the  last 168 hours. Lipid Profile: No results for input(s): CHOL, HDL, LDLCALC, TRIG, CHOLHDL, LDLDIRECT in the last 72 hours. Thyroid Function Tests: No results for input(s): TSH, T4TOTAL, FREET4, T3FREE, THYROIDAB in the last 72 hours. Anemia Panel: No results for input(s): VITAMINB12, FOLATE, FERRITIN, TIBC, IRON, RETICCTPCT in the last 72 hours. Urine analysis:    Component Value Date/Time   COLORURINE YELLOW 08/17/2016 1727   APPEARANCEUR CLEAR 08/17/2016 1727   LABSPEC 1.011 08/17/2016 1727   PHURINE 5.0 08/17/2016 1727   GLUCOSEU NEGATIVE 08/17/2016 1727   HGBUR NEGATIVE 08/17/2016 1727   BILIRUBINUR NEGATIVE 08/17/2016 1727   KETONESUR NEGATIVE 08/17/2016 1727   PROTEINUR NEGATIVE 08/17/2016 1727   NITRITE NEGATIVE 08/17/2016 1727   LEUKOCYTESUR NEGATIVE 08/17/2016 1727    Creatinine Clearance: CrCl cannot be calculated (Unknown ideal weight.).  Sepsis Labs: @LABRCNTIP (procalcitonin:4,lacticidven:4) )No results found for this or any previous visit (from the past 240 hour(s)).   Radiological Exams on Admission: No results found.  EKG: Independently reviewed. Sinus  tachycardia Left axis deviation Right bundle branch block  Assessment/Plan Principal Problem:   Symptomatic anemia Active Problems:   DVT (deep venous thrombosis) (HCC)   HTN (hypertension)   Hyperlipidemia   Hypothyroidism   GERD (gastroesophageal reflux disease)   Hypokalemia   Melena   GI bleed   1. Symptomatic anemia likely related to chronic GI bleed in a patient on Xarelto and iron deficiency anemia Hemoglobin 4.3 on admission. MCV 73.7. Patient has been compliant with her Xarelto. Oxygen saturation level greater than 90% on room air. Chest x-ray pending. EKG as noted above. Sinus tachycardia improved at point of admission. -Complete 3 units of packed red blood cells -Hold Xarelto -PT/INR -Serial CBC -Monitor  #2. GI bleed/melena. No episodes since she's been in the emergency department. She completed capsule study in [place] will bring paperwork tomorrow. FOBT positive. -See #1 -Clear liquids until midnight -Continue protonic drip -Nothing by mouth past midnight -Await GI recommendations  #3. DVT. Diagnosed 4 months ago. Provoked vs unprovoked. Was immobile due to knee and hip pain. Was on Xarelto. May benefit from Heme consult and/or evaluation for filter if anti-coag not an option. -Hold Xarelto -Monitor  #4. Hypokalemia. Potassium 3.1. Likely related to #2. -Replete -Recheck  #5. Hyperglycemia. Patient reports being "borderline diabetic". Home medications include metformin -Monitor CBGs -Hold metformin for now -Sliding scale insulin for optimal control  #6. Hypertension. Pressure on the high side of normal. She reports noncompliance with medications. Home meds include Lasix, hydrochlorothiazide, hydralazine, Toprol. Patient was tachycardic on presentation which is improved. -Am going to resume the Toprol for now -Hold hydralazine and hydrochlorothiazide and Lasix -Monitor blood pressure -Resume other home medications as indicated  #7. Hypothyroidism. Home  medications include Synthroid. Patient reports noncompliance with this medication -Obtain a TSH -Resume Synthroid  #8. GERD. -Protonic drip    DVT prophylaxis: scd  Code Status: full  Family Communication: niece at bedside  Disposition Plan: home  Consults called: [facility]  Admission status: inpatient    Gwenyth Bender MD [facility]  If 7PM-7AM, please contact night-coverage [email or web address] Password [facility]  12/04/2016, 4:41 PM

## 2016-12-04 NOTE — ED Triage Notes (Signed)
Pt reports generalized weakness onset yesterday along with blood in stool. Pt also reports shortness of breath and is unable to talk in complete sentences. Pt taking xerelto.

## 2016-12-04 NOTE — ED Notes (Signed)
Hgb 4.3 called to Dr. Staci AcostaSclossman

## 2016-12-04 NOTE — Consult Note (Signed)
Consultation  Referring Provider:     ED/TRH Primary Care Physician:  Patient, No Pcp Per Primary Gastroenterologist:        none Reason for Consultation:     Hematochezia + anemia     Impression / Plan:   #1 hematochezia, lower GI bleed suspected believe diverticular #2 Long history of iron deficiency anemia going back over one year at least, hemoglobin 6 with MCV 52 Summer 2017 #3 status post EGD colonoscopy and probably capsule endoscopy of small bowel within the past 6 months in [place], results not known.  Sounds like she was admitted with a diverticular bleed then.  Patient has records and her sister-in-law will bring them tomorrow #4 Xarelto therapy for DVT   Plan is for supportive care at this time.  Hold Xarelto.  Review records and determine next step.  Transfusion to reasonable hemoglobin between 7 and 8 makes sense.  Further plans pending clinical course and records review.  Okay to have clear liquids  I appreciate the opportunity to care for this patient.  Iva Booparl E. Shigeo Baugh, MD, [facility] [phone] (pager) 12/04/2016 5:43 PM         HPI:   Dana EpleyRegina Nelson is a 71 y.o. female with a history of iron deficiency anemia going back to at least the summer 2017, and recent history of GI bleeding in [place] within the past several months, worked up as above, who presents with a 1-2-day history of hematochezia again.  Hemoglobin 4.  Stools have been dark on iron, blood is described as tarry at times but witnessed in the ER by Dr. Dalene SeltzerSchlossman to be maroon.  Denies any intermittent cramps mild followed by gushes of bloody stools since yesterday.  She has been a little bit lightheaded.  Denies any nausea vomiting.  She was not taking any nonsteroidal inflammatory agents.  She says she was told she had inflammation in her colon and slight inflammation in her stomach on EGD.  She did not receive any therapy for colitis that I can tell though she was on sucralfate.   PPI also I think.  GI review of systems is otherwise negative.  Past Medical History:  Diagnosis Date  . Angioedema 2018  . Asthma   . Diabetes mellitus without complication (HCC)   . DVT (deep venous thrombosis) (HCC) 08/2016  . Eczema   . Gout   . Hyperlipemia   . Hypertension   . Hypothyroidism   . Iron deficiency anemia   . Lip swelling 08/2016  . Open-angle glaucoma   . Osteoarthritis   . Vitamin D deficiency     Past Surgical History:  Procedure Laterality Date  . ABDOMINAL HYSTERECTOMY    . COLONOSCOPY  2018  . ESOPHAGOGASTRODUODENOSCOPY  2018  . GIVENS CAPSULE STUDY  2018  . THYROIDECTOMY      Family history is noncontributory Social History  Substance Use Topics  . Smoking status: Never Smoker  . Smokeless tobacco: Never Used  . Alcohol use No    Prior to Admission medications   Medication Sig Start Date End Date Taking? Authorizing Provider  acetaminophen (TYLENOL) 500 MG tablet Take 500-1,000 mg by mouth every 6 (six) hours as needed for mild pain.    [provider]  allopurinol (ZYLOPRIM) 100 MG tablet Take 100 mg by mouth daily.    [provider]  aspirin EC 81 MG tablet Take 81 mg by mouth daily.    [provider]  ferrous gluconate [facility](FERGON)  324 MG tablet Take 324 mg by mouth daily with breakfast.    [provider]  furosemide (LASIX) 40 MG tablet Take 40 mg by mouth every other day.     [provider]  hydrALAZINE (APRESOLINE) 25 MG tablet Take 25 mg by mouth 3 (three) times daily.    [provider]  hydrochlorothiazide (MICROZIDE) 12.5 MG capsule Take 2 capsules (25 mg total) by mouth daily. 08/18/16 10/17/16  Levora Dredge, MD  levothyroxine (SYNTHROID, LEVOTHROID) 100 MCG tablet Take 100 mcg by mouth daily before breakfast.    [provider]  metFORMIN (GLUCOPHAGE-XR) 750 MG 24 hr tablet Take 750 mg by mouth 2 (two) times daily.    [provider]  metoprolol succinate  (TOPROL-XL) 50 MG 24 hr tablet Take 50 mg by mouth daily. Take with or immediately following a meal.    [provider]  omeprazole (PRILOSEC) 20 MG capsule Take 40 mg by mouth daily.    [provider]  polyethylene glycol (MIRALAX / GLYCOLAX) packet Take 17 g by mouth daily as needed for mild constipation. 08/18/16   Levora Dredge, MD  pravastatin (PRAVACHOL) 20 MG tablet Take 20 mg by mouth daily.    [provider]  rivaroxaban (XARELTO) 20 MG TABS tablet Take 20 mg by mouth daily.    [provider]    Current Facility-Administered Medications  Medication Dose Route Frequency Provider Last Rate Last Dose  . 0.9 %  sodium chloride infusion   Intravenous Continuous Black, Lesle Chris, NP      . acetaminophen (TYLENOL) tablet 650 mg  650 mg Oral Q6H PRN Gwenyth Bender, NP       Or  . acetaminophen (TYLENOL) suppository 650 mg  650 mg Rectal Q6H PRN Gwenyth Bender, NP      . allopurinol (ZYLOPRIM) tablet 100 mg  100 mg Oral Daily Black, Lesle Chris, NP      . Melene Muller ON 12/05/2016] ferrous gluconate (FERGON) tablet 324 mg  324 mg Oral Q breakfast Black, Karen M, NP      . insulin aspart (novoLOG) injection 0-9 Units  0-9 Units Subcutaneous Q4H Black, Lesle Chris, NP      . Melene Muller ON 12/05/2016] levothyroxine (SYNTHROID, LEVOTHROID) tablet 100 mcg  100 mcg Oral QAC breakfast Black, Lesle Chris, NP      . metoprolol succinate (TOPROL-XL) 24 hr tablet 50 mg  50 mg Oral Daily Black, Lesle Chris, NP      . ondansetron [facility]) tablet 4 mg  4 mg Oral Q6H PRN Gwenyth Bender, NP       Or  . ondansetron [facility]) injection 4 mg  4 mg Intravenous Q6H PRN Gwenyth Bender, NP      . pantoprazole (PROTONIX) 80 mg in sodium chloride 0.9 % 250 mL (0.32 mg/mL) infusion  8 mg/hr Intravenous Continuous Alvira Monday, MD 25 mL/hr at 12/04/16 1514 8 mg/hr at 12/04/16 1514  . potassium chloride SA (K-DUR,KLOR-CON) CR tablet 40 mEq  40 mEq Oral Once Black, Karen M, NP      . pravastatin  (PRAVACHOL) tablet 20 mg  20 mg Oral QHS Black, Karen M, NP      . traZODone (DESYREL) tablet 25 mg  25 mg Oral QHS PRN Gwenyth Bender, NP       Current Outpatient Prescriptions  Medication Sig Dispense Refill  . acetaminophen (TYLENOL) 500 MG tablet Take 500-1,000 mg by mouth every 6 (six) hours as needed for  mild pain.    Marland Kitchen allopurinol (ZYLOPRIM) 100 MG tablet Take 100 mg by mouth daily.    Marland Kitchen aspirin EC 81 MG tablet Take 81 mg by mouth daily.    . ferrous gluconate (FERGON) 324 MG tablet Take 324 mg by mouth daily with breakfast.    . furosemide (LASIX) 40 MG tablet Take 40 mg by mouth every other day.     . hydrALAZINE (APRESOLINE) 25 MG tablet Take 25 mg by mouth 3 (three) times daily.    . hydrochlorothiazide (MICROZIDE) 12.5 MG capsule Take 2 capsules (25 mg total) by mouth daily. 30 capsule 1  . levothyroxine (SYNTHROID, LEVOTHROID) 100 MCG tablet Take 100 mcg by mouth daily before breakfast.    . metFORMIN (GLUCOPHAGE-XR) 750 MG 24 hr tablet Take 750 mg by mouth 2 (two) times daily.    . metoprolol succinate (TOPROL-XL) 50 MG 24 hr tablet Take 50 mg by mouth daily. Take with or immediately following a meal.    . omeprazole (PRILOSEC) 20 MG capsule Take 40 mg by mouth daily.    . polyethylene glycol (MIRALAX / GLYCOLAX) packet Take 17 g by mouth daily as needed for mild constipation. 14 each 0  . pravastatin (PRAVACHOL) 20 MG tablet Take 20 mg by mouth daily.    . rivaroxaban (XARELTO) 20 MG TABS tablet Take 20 mg by mouth daily.      Allergies as of 12/04/2016 - Review Complete 12/04/2016  Allergen Reaction Noted  . Losartan Swelling 08/18/2016  . Peanut-containing drug products Other (See Comments) 08/17/2016  . Penicillins Other (See Comments) 08/17/2016     Review of Systems:    This is positive for those things mentioned in the HPI, also positive for DVT in the past several months.  Hip surgery postponed because of that. All other review of systems are negative.        Physical Exam:  Vital signs in last 24 hours: Temp:  [97.3 F (36.3 C)-98.3 F (36.8 C)] 97.8 F (36.6 C) (10/27 1645) Pulse Rate:  [89-108] 89 (10/27 1645) Resp:  [12-29] 22 (10/27 1645) BP: (108-163)/(48-92) 151/92 (10/27 1645) SpO2:  [99 %-100 %] 100 % (10/27 1630)    General:  Well-developed, well-nourished and in no acute distress Eyes:  anicteric. Lungs: Clear to auscultation bilaterally. Heart:  S1S2, no rubs, murmurs, gallops. Abdomen:  soft, non-tender, no hepatosplenomegaly, hernia, or mass and BS+.  Lymph:  no cervical or supraclavicular adenopathy. Extremities:   no edema Skin   no rash. Neuro:  A&O x 3.  Psych:  appropriate mood and  Affect.   Data Reviewed:   LAB RESULTS:  Recent Labs  12/04/16 1251  WBC 12.0*  HGB 4.3*  HCT 14.6*  PLT 618*   BMET  Recent Labs  12/04/16 1251  NA 141  K 3.1*  CL 106  CO2 19*  GLUCOSE 137*  BUN 18  CREATININE 1.10*  CALCIUM 8.8*   LFT  Recent Labs  12/04/16 1251  PROT 6.5  ALBUMIN 2.9*  AST 20  ALT 11*  ALKPHOS 75  BILITOT 0.5    STUDIES: Portable Chest 1 View  Result Date: 12/04/2016 CLINICAL DATA:  Dyspnea which shortness-of-breath, lightheadedness and weakness today. EXAM: PORTABLE CHEST 1 VIEW COMPARISON:  None. FINDINGS: Lungs are adequately inflated without focal consolidation or effusion. Cardiomediastinal silhouette is within normal. There is calcified plaque over the thoracic aorta. There are degenerative changes of the spine and shoulders. IMPRESSION: No acute cardiopulmonary disease. Electronically Signed  By: Elberta Fortis M.D.   On: 12/04/2016 17:14      LOS: 0 days   @Jeury Mcnab  Sena Slate, MD, [facility] @  12/04/2016, 5:43 PM `

## 2016-12-04 NOTE — Progress Notes (Signed)
Pt admitted to the unit at 1830. Pt mental status is A&O x4. Pt oriented to room, staff, and call bell. Skin is intact except where otherwise charted. Full assessment charted in [facility]. Call bell within reach. Visitor guidelines reviewed w/ pt and/or family.  

## 2016-12-05 ENCOUNTER — Encounter (HOSPITAL_COMMUNITY): Payer: Self-pay | Admitting: Internal Medicine

## 2016-12-05 DIAGNOSIS — K5520 Angiodysplasia of colon without hemorrhage: Secondary | ICD-10-CM | POA: Diagnosis present

## 2016-12-05 LAB — BASIC METABOLIC PANEL
Anion gap: 8 (ref 5–15)
BUN: 13 mg/dL (ref 6–20)
CALCIUM: 8.7 mg/dL — AB (ref 8.9–10.3)
CO2: 24 mmol/L (ref 22–32)
CREATININE: 0.73 mg/dL (ref 0.44–1.00)
Chloride: 107 mmol/L (ref 101–111)
Glucose, Bld: 98 mg/dL (ref 65–99)
Potassium: 4 mmol/L (ref 3.5–5.1)
SODIUM: 139 mmol/L (ref 135–145)

## 2016-12-05 LAB — CBC
HCT: 25.5 % — ABNORMAL LOW (ref 36.0–46.0)
Hemoglobin: 8.1 g/dL — ABNORMAL LOW (ref 12.0–15.0)
MCH: 24.8 pg — AB (ref 26.0–34.0)
MCHC: 31.8 g/dL (ref 30.0–36.0)
MCV: 78.2 fL (ref 78.0–100.0)
PLATELETS: 400 10*3/uL (ref 150–400)
RBC: 3.26 MIL/uL — AB (ref 3.87–5.11)
RDW: 15.8 % — ABNORMAL HIGH (ref 11.5–15.5)
WBC: 8.2 10*3/uL (ref 4.0–10.5)

## 2016-12-05 LAB — GLUCOSE, CAPILLARY
GLUCOSE-CAPILLARY: 84 mg/dL (ref 65–99)
GLUCOSE-CAPILLARY: 102 mg/dL — AB (ref 65–99)
GLUCOSE-CAPILLARY: 94 mg/dL (ref 65–99)
Glucose-Capillary: 92 mg/dL (ref 65–99)
Glucose-Capillary: 96 mg/dL (ref 65–99)

## 2016-12-05 MED ORDER — SODIUM CHLORIDE 0.9 % IV SOLN
510.0000 mg | Freq: Once | INTRAVENOUS | Status: AC
  Administered 2016-12-05: 510 mg via INTRAVENOUS
  Filled 2016-12-05 (×2): qty 17

## 2016-12-05 NOTE — Progress Notes (Addendum)
   Patient Name: Dana Nelson Date of Encounter: 12/05/2016, 10:46 AM    Subjective  I'm hungry - no more bleeding   Objective  BP 103/69 (BP Location: Left Wrist)   Pulse 81   Temp 98 F (36.7 C) (Oral)   Resp 18   Ht 5' 3" (1.6 m)   Wt 164 lb 1.6 oz (74.4 kg)   SpO2 99%   BMI 29.07 kg/m  NAD  CBC Latest Ref Rng & Units 12/05/2016 12/04/2016 12/04/2016  WBC 4.0 - 10.5 K/uL 8.2 8.9 12.0(H)  Hemoglobin 12.0 - 15.0 g/dL 8.1(L) 7.7(L) 4.3(LL)  Hematocrit 36.0 - 46.0 % 25.5(L) 23.4(L) 14.6(L)  Platelets 150 - 400 K/uL 400 392 618(H)    Records review. 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 Danis - The risks and benefits as well as alternatives of endoscopic procedure(s) have been discussed and reviewed. All questions answered. The patient agrees to proceed.  - ? Does she need to continue Xarelto   - reg diet today   - feraheme   - will request actual EGD, colon and path reports - [facility] not Epic so no care everywhere    Aamir Mclinden E. Miarose Lippert, MD, FACG [facility] [phone] (pager) 12/05/2016 10:46 AM  

## 2016-12-05 NOTE — Progress Notes (Signed)
PROGRESS NOTE    Dana Nelson  ZOX:[id number] DOB: 01-16-1946 DOA: 12/04/2016 PCP: Patient, No Pcp Per  Brief Narrative:  Dana Nelson is a very pleasant 71 y.o. female with medical history significant for DVT recently on Xarelto, Hypertension, Hyperlipidemia, Hypothyroidism, Chronic GI bleed with iron deficiency anemia presents to emergency Department chief complaint melena. Initial evaluation reveals hemoglobin of 4.3. TRH was asked to admit  Patient reported generalized weakness onset yesterday along with dark maroon stool. Associated symptoms include dizziness nausea without vomiting shortness of breath with exertion. She also reports some noncompliance with her home medications except for the Xarelto. Patient denies headache visual disturbances syncope or near-syncope. She denies chest pain palpitation, abdominal pain dysuria hematuria frequency or urgency. She denies any lower extremity edema. She states she just relocated from [place] where she had a capsule study that indicated "lower part of my stomach inflamed". Reports she's had an EGD this year when that was unremarkable as well as a colonoscopy that had to polyps and diverticulosis. Was admitted in July for angioedema and incidental finding was a hemoglobin of 6.3 at which time she underwent transfusion.  Gastroenterology was consulted for further evaluation and plan is for Enteroscopy in AM. She was given IV Iron today. Will be checking Lower Extremity Duplex to see if DVT is still there.   Assessment & Plan:   Principal Problem:   Symptomatic anemia Active Problems:   DVT (deep venous thrombosis) (HCC)   HTN (hypertension)   Hyperlipidemia   Hypothyroidism   GERD (gastroesophageal reflux disease)   Hypokalemia   Melena   GI bleed   AVM (arteriovenous malformation) of small bowel, acquired (HCC)  Symptomatic Anemia likely related to chronic GI bleed in a patient on Xarelto and Iron Deficiency Anemia  -Hemoglobin 4.3 on  admission. MCV 73.7.  -Patient has been compliant with her Xarelto.  -Oxygen saturation level greater than 90% on room air.  -Complete 3 units of packed red blood cells -Hold Xarelto -PT/INR was 17.1 and 1.40 -Serial CBC showed Patient's Hb/Hct went from 4.3/14.6 -> 7.7/23.4 -> 8.1/25.5 -Given IV Ferriheme by Gastroenterology -Continue to Monitor and repeat CBC in AM   GI Bleed/Melena.  -No episodes since she's been in the emergency department.  -She completed capsule study in [place]; Sister brought paperwork -FOBT Positive -See Above -S/p Transfusion 3 units -Gastroenterology resumed Regular Diet today and Will proceed with Enteroscopy in AM  Left Leg DVT.  -Diagnosed 4 months ago. Provoked vs unprovoked.  -Was Immobile due to knee and hip pain. Was on Xarelto.  -May benefit from Heme consult and/or evaluation for filter if anti-coag not an option. -Holding Xarelto and don't know if she will still need it -Check Left Lower Extremity Venous Duplex to see if LE DVT still present  -Monitor  Hypokalemia.  -K+ was 3.1 on Admission and improved to 4.0 -Continue to Monitor and Replete as Necessary -Repeat CMP in AM  Hyperglycemia.  -Patient reports being "borderline diabetic" -Check HbA1c in AM   -Home medications include Metformin -Monitor CBGs; CBG's ranging from 84-102 -Hold Metformin for now -C/w Sensitive Novolog Sliding Scale Insulin q4h for optimal control  Hypertension.  -Pressure on the high side of normal.  -She reports noncompliance with medications. -Home meds include Lasix, Hydrochlorothiazide, Hydralazine, Toprol. Patient was tachycardic on presentation which is improved. -C/w Metoprolol Succinate 50 mg po Daily  -Hold Hydralazine and hydrochlorothiazide and Lasix -Continue to Monitor Blood Pressure -Resume other home medications as indicated  Hypothyroidism.  -Home  medications include Synthroid.  -Patient reports noncompliance with this  medication -Obtained a TSH and was 0.359 -Resume Levothyroxine 100 mcg po Daily  GERD.  -C/wProtonix gtt  DVT prophylaxis: SCDs Code Status: FULL CODE Family Communication: No family present at bedside Disposition Plan: Remain Inpatient for Further workup  Consultants:   Gastroenterology    Procedures: Enteroscopy in AM    Antimicrobials:  Anti-infectives    None     Subjective: Seen and examined at bedside and states she felt ok but was extremely hungry. No CP and feels better after the blood. No lightheadedness or dizziness. No other complaints or concerns at this time.   Objective: Vitals:   12/05/16 0519 12/05/16 0753 12/05/16 1155 12/05/16 1621  BP: (!) 159/72 103/69 (!) 156/83 (!) 144/82  Pulse: 83 81 79 79  Resp: 18 18 18 17   Temp: 98.2 F (36.8 C) 98 F (36.7 C) 98 F (36.7 C) 98 F (36.7 C)  TempSrc: Oral Oral    SpO2: 100% 99% 100% 99%  Weight:      Height:        Intake/Output Summary (Last 24 hours) at 12/05/16 1855 Last data filed at 12/05/16 0900  Gross per 24 hour  Intake            957.5 ml  Output             1950 ml  Net           -992.5 ml   Filed Weights   12/04/16 1946  Weight: 74.4 kg (164 lb 1.6 oz)   Examination: Physical Exam:  Constitutional: WN/WD AAF in NAD and appears calm and comfortable Eyes: Lids normal; conjunctivae with some pallor, sclerae anicteric  ENMT: External Ears, Nose appear normal. Grossly normal hearing. Mucous membranes are moist.  Neck: Appears normal, supple, no cervical masses, normal ROM, no appreciable thyromegaly, no JVD Respiratory: Diminished to auscultation bilaterally, no wheezing, rales, rhonchi or crackles. Normal respiratory effort and patient is not tachypenic. No accessory muscle use.  Cardiovascular: RRR, no murmurs / rubs / gallops. S1 and S2 auscultated. 1+ LE extremity edema especially in Left Leg.  Abdomen: Soft, non-tender, non-distended. No masses palpated. No appreciable  hepatosplenomegaly. Bowel sounds positive x4.  GU: Deferred. Musculoskeletal: No clubbing / cyanosis of digits/nails. No joint deformity upper and lower extremities.   Skin: No rashes, lesions, ulcers on a limited skin eval. No induration; Warm and dry.  Neurologic: CN 2-12 grossly intact with no focal deficits. Strength 5/5 in all 4. Romberg sign cerebellar reflexes not assessed.  Psychiatric: Normal judgment and insight. Alert and oriented x 3. Normal mood and appropriate affect.   Data Reviewed: I have personally reviewed following labs and imaging studies  CBC:  Recent Labs Lab 12/04/16 1251 12/04/16 2210 12/05/16 0431  WBC 12.0* 8.9 8.2  NEUTROABS 8.6*  --   --   HGB 4.3* 7.7* 8.1*  HCT 14.6* 23.4* 25.5*  MCV 73.7* 78.0 78.2  PLT 618* 392 400   Basic Metabolic Panel:  Recent Labs Lab 12/04/16 1251 12/05/16 0431  NA 141 139  K 3.1* 4.0  CL 106 107  CO2 19* 24  GLUCOSE 137* 98  BUN 18 13  CREATININE 1.10* 0.73  CALCIUM 8.8* 8.7*   GFR: Estimated Creatinine Clearance: 64.1 mL/min (by C-G formula based on SCr of 0.73 mg/dL). Liver Function Tests:  Recent Labs Lab 12/04/16 1251  AST 20  ALT 11*  ALKPHOS 75  BILITOT 0.5  PROT  6.5  ALBUMIN 2.9*    Recent Labs Lab 12/04/16 1251  LIPASE 16   No results for input(s): AMMONIA in the last 168 hours. Coagulation Profile:  Recent Labs Lab 12/04/16 1836  INR 1.40   Cardiac Enzymes: No results for input(s): CKTOTAL, CKMB, CKMBINDEX, TROPONINI in the last 168 hours. BNP (last 3 results) No results for input(s): PROBNP in the last 8760 hours. HbA1C: No results for input(s): HGBA1C in the last 72 hours. CBG:  Recent Labs Lab 12/04/16 2359 12/05/16 0518 12/05/16 0751 12/05/16 1147 12/05/16 1611  GLUCAP 92 92 96 84 102*   Lipid Profile: No results for input(s): CHOL, HDL, LDLCALC, TRIG, CHOLHDL, LDLDIRECT in the last 72 hours. Thyroid Function Tests:  Recent Labs  12/04/16 1836  TSH 0.359    Anemia Panel: No results for input(s): VITAMINB12, FOLATE, FERRITIN, TIBC, IRON, RETICCTPCT in the last 72 hours. Sepsis Labs: No results for input(s): PROCALCITON, LATICACIDVEN in the last 168 hours.  No results found for this or any previous visit (from the past 240 hour(s)).   Radiology Studies: Portable Chest 1 View  Result Date: 12/04/2016 CLINICAL DATA:  Dyspnea which shortness-of-breath, lightheadedness and weakness today. EXAM: PORTABLE CHEST 1 VIEW COMPARISON:  None. FINDINGS: Lungs are adequately inflated without focal consolidation or effusion. Cardiomediastinal silhouette is within normal. There is calcified plaque over the thoracic aorta. There are degenerative changes of the spine and shoulders. IMPRESSION: No acute cardiopulmonary disease. Electronically Signed   By: Elberta Fortis M.D.   On: 12/04/2016 17:14   Scheduled Meds: . allopurinol  100 mg Oral Daily  . insulin aspart  0-9 Units Subcutaneous Q4H  . levothyroxine  100 mcg Oral QAC breakfast  . metoprolol succinate  50 mg Oral Daily  . pravastatin  20 mg Oral QHS   Continuous Infusions: . pantoprozole (PROTONIX) infusion 8 mg/hr (12/05/16 1424)    LOS: 1 day   Merlene Laughter, DO [facility] Pager [phone]  If 7PM-7AM, please contact night-coverage [email or web address] Password [facility] 12/05/2016, 6:55 PM

## 2016-12-06 ENCOUNTER — Encounter (HOSPITAL_COMMUNITY)
Admission: EM | Disposition: A | Discharge status: Home Health | Payer: Self-pay | Source: Home / Self Care | Attending: Internal Medicine

## 2016-12-06 ENCOUNTER — Encounter (HOSPITAL_COMMUNITY): Payer: Self-pay | Admitting: Anesthesiology

## 2016-12-06 ENCOUNTER — Inpatient Hospital Stay (HOSPITAL_COMMUNITY): Payer: Medicare Other | Admitting: Anesthesiology

## 2016-12-06 ENCOUNTER — Inpatient Hospital Stay (HOSPITAL_COMMUNITY): Payer: Medicare Other

## 2016-12-06 DIAGNOSIS — R0600 Dyspnea, unspecified: Secondary | ICD-10-CM

## 2016-12-06 DIAGNOSIS — I82409 Acute embolism and thrombosis of unspecified deep veins of unspecified lower extremity: Secondary | ICD-10-CM

## 2016-12-06 DIAGNOSIS — K921 Melena: Secondary | ICD-10-CM

## 2016-12-06 DIAGNOSIS — K558 Other vascular disorders of intestine: Secondary | ICD-10-CM

## 2016-12-06 HISTORY — PX: ENTEROSCOPY: SHX5533

## 2016-12-06 LAB — CBC WITH DIFFERENTIAL/PLATELET
Basophils Absolute: 0 10*3/uL (ref 0.0–0.1)
Basophils Relative: 0 %
EOS ABS: 0.2 10*3/uL (ref 0.0–0.7)
EOS PCT: 2 %
HCT: 26 % — ABNORMAL LOW (ref 36.0–46.0)
HEMOGLOBIN: 8.3 g/dL — AB (ref 12.0–15.0)
LYMPHS ABS: 1.7 10*3/uL (ref 0.7–4.0)
Lymphocytes Relative: 21 %
MCH: 25.2 pg — AB (ref 26.0–34.0)
MCHC: 31.9 g/dL (ref 30.0–36.0)
MCV: 79 fL (ref 78.0–100.0)
MONOS PCT: 8 %
Monocytes Absolute: 0.6 10*3/uL (ref 0.1–1.0)
Neutro Abs: 5.5 10*3/uL (ref 1.7–7.7)
Neutrophils Relative %: 69 %
PLATELETS: 437 10*3/uL — AB (ref 150–400)
RBC: 3.29 MIL/uL — ABNORMAL LOW (ref 3.87–5.11)
RDW: 16 % — ABNORMAL HIGH (ref 11.5–15.5)
WBC: 8 10*3/uL (ref 4.0–10.5)

## 2016-12-06 LAB — COMPREHENSIVE METABOLIC PANEL
ALBUMIN: 2.9 g/dL — AB (ref 3.5–5.0)
ALT: 10 U/L — ABNORMAL LOW (ref 14–54)
ANION GAP: 9 (ref 5–15)
AST: 15 U/L (ref 15–41)
Alkaline Phosphatase: 72 U/L (ref 38–126)
BILIRUBIN TOTAL: 1.1 mg/dL (ref 0.3–1.2)
BUN: 7 mg/dL (ref 6–20)
CO2: 27 mmol/L (ref 22–32)
Calcium: 9.1 mg/dL (ref 8.9–10.3)
Chloride: 104 mmol/L (ref 101–111)
Creatinine, Ser: 0.73 mg/dL (ref 0.44–1.00)
GFR calc Af Amer: >60 mL/min (ref >60)
GLUCOSE: 97 mg/dL (ref 65–99)
POTASSIUM: 3.8 mmol/L (ref 3.5–5.1)
Sodium: 140 mmol/L (ref 135–145)
TOTAL PROTEIN: 6.4 g/dL — AB (ref 6.5–8.1)

## 2016-12-06 LAB — GLUCOSE, CAPILLARY
GLUCOSE-CAPILLARY: 104 mg/dL — AB (ref 65–99)
GLUCOSE-CAPILLARY: 90 mg/dL (ref 65–99)
GLUCOSE-CAPILLARY: 75 mg/dL (ref 65–99)
GLUCOSE-CAPILLARY: 65 mg/dL (ref 65–99)
GLUCOSE-CAPILLARY: 125 mg/dL — AB (ref 65–99)
GLUCOSE-CAPILLARY: 117 mg/dL — AB (ref 65–99)
Glucose-Capillary: 110 mg/dL — ABNORMAL HIGH (ref 65–99)
Glucose-Capillary: 82 mg/dL (ref 65–99)

## 2016-12-06 LAB — HEMOGLOBIN AND HEMATOCRIT, BLOOD
HCT: 26.3 % — ABNORMAL LOW (ref 36.0–46.0)
Hemoglobin: 8.4 g/dL — ABNORMAL LOW (ref 12.0–15.0)

## 2016-12-06 LAB — MAGNESIUM: MAGNESIUM: 1.6 mg/dL — AB (ref 1.7–2.4)

## 2016-12-06 LAB — PHOSPHORUS: PHOSPHORUS: 3.3 mg/dL (ref 2.5–4.6)

## 2016-12-06 SURGERY — ENTEROSCOPY
Anesthesia: Monitor Anesthesia Care

## 2016-12-06 MED ORDER — BUTAMBEN-TETRACAINE-BENZOCAINE 2-2-14 % EX AERO
INHALATION_SPRAY | CUTANEOUS | Status: DC | PRN
Start: 2016-12-06 — End: 2016-12-06
  Administered 2016-12-06: 2 via TOPICAL

## 2016-12-06 MED ORDER — LEVOTHYROXINE SODIUM 112 MCG PO TABS
112.0000 ug | ORAL_TABLET | Freq: Every day | ORAL | Status: DC
  Administered 2016-12-06 – 2016-12-10 (×5): 112 ug via ORAL
  Filled 2016-12-06 (×5): qty 1

## 2016-12-06 MED ORDER — SODIUM CHLORIDE 0.9 % IV SOLN: INTRAVENOUS | Status: DC

## 2016-12-06 MED ORDER — PROPOFOL 500 MG/50ML IV EMUL
INTRAVENOUS | Status: DC | PRN
  Administered 2016-12-06: 75 ug/kg/min via INTRAVENOUS

## 2016-12-06 MED ORDER — PHENYLEPHRINE HCL 10 MG/ML IJ SOLN
INTRAMUSCULAR | Status: DC | PRN
  Administered 2016-12-06: 120 ug via INTRAVENOUS
  Administered 2016-12-06: 80 ug via INTRAVENOUS

## 2016-12-06 MED ORDER — MAGNESIUM SULFATE 2 GM/50ML IV SOLN
2.0000 g | Freq: Once | INTRAVENOUS | Status: AC
  Administered 2016-12-06: 2 g via INTRAVENOUS
  Filled 2016-12-06: qty 50

## 2016-12-06 MED ORDER — PROPOFOL 10 MG/ML IV BOLUS
INTRAVENOUS | Status: DC | PRN
  Administered 2016-12-06 (×3): 20 mg via INTRAVENOUS

## 2016-12-06 MED ORDER — FERROUS SULFATE 325 (65 FE) MG PO TABS
325.0000 mg | ORAL_TABLET | Freq: Every day | ORAL | Status: DC
  Administered 2016-12-06 – 2016-12-10 (×5): 325 mg via ORAL
  Filled 2016-12-06 (×6): qty 1

## 2016-12-06 MED ORDER — ONDANSETRON HCL 4 MG/2ML IJ SOLN
INTRAMUSCULAR | Status: DC | PRN
Start: 2016-12-06 — End: 2016-12-06
  Administered 2016-12-06: 4 mg via INTRAVENOUS

## 2016-12-06 MED ORDER — HYDRALAZINE HCL 25 MG PO TABS
25.0000 mg | ORAL_TABLET | Freq: Two times a day (BID) | ORAL | Status: DC
  Administered 2016-12-06 – 2016-12-10 (×9): 25 mg via ORAL
  Filled 2016-12-06 (×11): qty 1

## 2016-12-06 MED ORDER — LACTATED RINGERS IV SOLN
INTRAVENOUS | Status: DC | PRN
  Administered 2016-12-06: 11:00:00 via INTRAVENOUS

## 2016-12-06 NOTE — Anesthesia Procedure Notes (Signed)
Procedure Name: MAC Date/Time: 12/06/2016 12:13 PM Performed by: Jenne Campus Pre-anesthesia Checklist: Patient identified, Emergency Drugs available, Suction available and Patient being monitored Oxygen Delivery Method: Simple face mask

## 2016-12-06 NOTE — Transfer of Care (Signed)
Immediate Anesthesia Transfer of Care Note  Patient: Dana Nelson  Procedure(s) Performed: ENTEROSCOPY (N/A )  Patient Location: Endoscopy Unit  Anesthesia Type:MAC  Level of Consciousness: awake, oriented and patient cooperative  Airway & Oxygen Therapy: Patient Spontanous Breathing and Patient connected to nasal cannula oxygen  Post-op Assessment: Report given to RN and Post -op Vital signs reviewed and stable  Post vital signs: Reviewed  Last Vitals:  Vitals:   12/06/16 0015 12/06/16 0413  BP: (!) 143/72 (!) 156/78  Pulse: 79 81  Resp: 18 18  Temp: 36.8 C 36.7 C  SpO2: 98% 100%    Last Pain:  Vitals:   12/06/16 1050  TempSrc: Oral         Complications: No apparent anesthesia complications

## 2016-12-06 NOTE — H&P (View-Only) (Signed)
   Patient Name: Dana EpleyRegina Nelson Date of Encounter: 12/05/2016, 10:46 AM    Subjective  I'm hungry - no more bleeding   Objective  BP 103/69 (BP Location: Left Wrist)   Pulse 81   Temp 98 F (36.7 C) (Oral)   Resp 18   Ht 5\' 3"  (1.6 m)   Wt 164 lb 1.6 oz (74.4 kg)   SpO2 99%   BMI 29.07 kg/m  NAD  CBC Latest Ref Rng & Units 12/05/2016 12/04/2016 12/04/2016  WBC 4.0 - 10.5 K/uL 8.2 8.9 12.0(H)  Hemoglobin 12.0 - 15.0 g/dL 8.1(L) 7.7(L) 4.3(LL)  Hematocrit 36.0 - 46.0 % 25.5(L) 23.4(L) 14.6(L)  Platelets 150 - 400 K/uL 400 392 618(H)    Records review. 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 Myrtie Neitheranis - The risks and benefits as well as alternatives of endoscopic procedure(s) have been discussed and reviewed. All questions answered. The patient agrees to proceed.  - ? Does she need to continue Xarelto   - reg diet today   - feraheme   - will request actual EGD, colon and path reports - [facility] so no care everywhere    Iva Booparl E. Justen Fonda, MD, Antionette FairyFACG [facility] [phone] (pager) 12/05/2016 10:46 AM

## 2016-12-06 NOTE — Progress Notes (Signed)
Patient blood glucose 65 in recovery.  Patient given 4 oz ginger ale and 3 graham crackers.  Patient glucose 82 or recheck after 30 minutes.  Patient returned to room.  RN on floor aware.

## 2016-12-06 NOTE — Interval H&P Note (Signed)
History and Physical Interval Note:  12/06/2016 11:55 AM  Dana Nelson  has presented today for surgery, with the diagnosis of GI bleed from jejunal AVM;s  The various methods of treatment have been discussed with the patient and family. After consideration of risks, benefits and other options for treatment, the patient has consented to  Procedure(s): ENTEROSCOPY (N/A) as a surgical intervention .  The patient's history has been reviewed, patient examined, no change in status, stable for surgery.  I have reviewed the patient's chart and labs.  Questions were answered to the patient's satisfaction.     Charlie PitterHenry L Danis III

## 2016-12-06 NOTE — Op Note (Signed)
[facility] Patient Name: Dana Nelson Procedure Date : 12/06/2016 MRN: [id number] Attending MD: Estill Cotta. Loletha Carrow , MD Date of Birth: 09/21/1945 CSN: [id number] Age: 71 Admit Type: Inpatient Procedure:                Small bowel enteroscopy Indications:              Melena, Arteriovenous malformation in the small                            intestine (reported on video capsule study at                            outside institution a month ago) Providers:                Mallie Mussel L. Loletha Carrow, MD, Carmie End, RN, Cletis Athens, Technician Referring MD:              Medicines:                Monitored Anesthesia Care Complications:            No immediate complications. Estimated Blood Loss:     Estimated blood loss: none. Procedure:                Pre-Anesthesia Assessment:                           - Prior to the procedure, a History and Physical                            was performed, and patient medications and                            allergies were reviewed. The patient's tolerance of                            previous anesthesia was also reviewed. The risks                            and benefits of the procedure and the sedation                            options and risks were discussed with the patient.                            All questions were answered, and informed consent                            was obtained. Prior Anticoagulants: The patient has                            taken Xarelto (rivaroxaban), last dose was 2 days  prior to procedure. ASA Grade Assessment: III - A                            patient with severe systemic disease. After                            reviewing the risks and benefits, the patient was                            deemed in satisfactory condition to undergo the                            procedure.                           After obtaining informed consent, the endoscope  was                            passed under direct vision. Throughout the                            procedure, the patient's blood pressure, pulse, and                            oxygen saturations were monitored continuously. The                            EC-[id number] ([id number]) scope was introduced through                            the mouth and advanced to the proximal jejunum. The                            small bowel enteroscopy was accomplished without                            difficulty. The patient tolerated the procedure                            well. Scope In: Scope Out: Findings:      The esophagus was normal.      The stomach was normal, including on retroflexion.      The examined duodenum was normal.      There was no evidence of significant pathology in the entire examined       portion of jejunum. Impression:               - Normal esophagus.                           - Normal stomach.                           - Normal examined duodenum.                           -  The examined portion of the jejunum was normal.                            The reported jejunal lesions were not reached on                            this exam.                           - No specimens collected. Moderate Sedation:      MAC sedation used Recommendation:           - Continue present medications.                           - Internal medicine service should address the                            strength of indication of Bruni on this patient.                            Perhaps a repeat vascular study would determine if                            the clot is still present and whether it is in a                            superficial or deep vein.                           - Resume regular diet.                           - The patient has had chronic iron deficiency                            anemia before the more recent onset of overt                            bleeding. Even if this  patient remains off Friona and                            does not have recurrent overt bleeding, she still                            appears to need a double-balloon entoeroscopy at an                            academic institution to localize and treat the                            source of small bowel bleeding. It is a question of  timing - inpatient vs outpatient. Procedure Code(s):        --- Professional ---                           [phone], Small intestinal endoscopy, enteroscopy                            beyond second portion of duodenum, not including                            ileum; diagnostic, including collection of                            specimen(s) by brushing or washing, when performed                            (separate procedure) Diagnosis Code(s):        --- Professional ---                           K92.1, Melena (includes Hematochezia)                           Q27.33, Arteriovenous malformation of digestive                            system vessel CPT copyright 2016 [employer]. All rights reserved. The codes documented in this report are preliminary and upon coder review may  be revised to meet current compliance requirements. Ellizabeth Dacruz L. Loletha Carrow, MD 12/06/2016 12:47:28 PM This report has been signed electronically. Number of Addenda: 0

## 2016-12-06 NOTE — Progress Notes (Signed)
LLE venous duplex  has been completed. Preliminary results can be found: Chart review -> CV Proc  Munira Polson Eunice, RDMS, RVT   

## 2016-12-06 NOTE — Anesthesia Preprocedure Evaluation (Addendum)
Anesthesia Evaluation  Patient identified by MRN, date of birth, ID band Patient awake    Reviewed: Allergy & Precautions, NPO status , Patient's Chart, lab work & pertinent test results  Airway Mallampati: II  TM Distance: >3 FB Neck ROM: Full    Dental no notable dental hx. (+) Teeth Intact, Dental Advisory Given   Pulmonary asthma ,    Pulmonary exam normal breath sounds clear to auscultation       Cardiovascular hypertension, Pt. on medications and Pt. 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;s   Endo/Other  diabetes, Oral Hypoglycemic AgentsHypothyroidism   Renal/GU negative Renal ROS     Musculoskeletal negative musculoskeletal ROS (+)   Abdominal   Peds  Hematology  (+) anemia ,   Anesthesia Other Findings HLD Gout Angioedema DVT (deep venous thrombosis)   Reproductive/Obstetrics                           Anesthesia Physical Anesthesia Plan  ASA: II  Anesthesia Plan: MAC   Post-op Pain Management:    Induction: Intravenous  PONV Risk Score and Plan: 2 and Propofol infusion and Treatment may vary due to age or medical condition  Airway Management Planned: Natural Airway  Additional Equipment:   Intra-op Plan:   Post-operative Plan:   Informed Consent: I have reviewed the patients History and Physical, chart, labs and discussed the procedure including the risks, benefits and alternatives for the proposed anesthesia with the patient or authorized representative who has indicated his/her understanding and acceptance.   Dental advisory given  Plan Discussed with: CRNA  Anesthesia Plan Comments:         Anesthesia Quick Evaluation

## 2016-12-06 NOTE — Progress Notes (Signed)
PROGRESS NOTE    Dana Nelson  ZOX:[id number] DOB: Oct 02, 1945 DOA: 12/04/2016 PCP: Patient, No Pcp Per  Brief Narrative:  Dana Nelson is a very pleasant 71 y.o. female with medical history significant for DVT recently on Xarelto, Hypertension, Hyperlipidemia, Hypothyroidism, Chronic GI bleed with iron deficiency anemia presents to emergency Department chief complaint melena. Initial evaluation reveals hemoglobin of 4.3. TRH was asked to admit  Patient reported generalized weakness onset yesterday along with dark maroon stool. Associated symptoms include dizziness nausea without vomiting shortness of breath with exertion. She also reports some noncompliance with her home medications except for the Xarelto. Patient denies headache visual disturbances syncope or near-syncope. She denies chest pain palpitation, abdominal pain dysuria hematuria frequency or urgency. She denies any lower extremity edema. She states she just relocated from [place] where she had a capsule study that indicated "lower part of my stomach inflamed". Reports she's had an EGD this year when that was unremarkable as well as a colonoscopy that had to polyps and diverticulosis. Was admitted in July for angioedema and incidental finding was a hemoglobin of 6.3 at which time she underwent transfusion.  Gastroenterology was consulted for further evaluation and plan is for Enteroscopy in AM. She was given IV Iron 10/28. Will be checking Bilateral Lower Extremity Duplex to see if DVT is still there.   Patient underwent enteroscopy this AM and was essentially normal. Vascular U/S from yesterday is still pending to be done so will re-order.   Assessment & Plan:   Principal Problem:   Blood loss anemia Active Problems:   DVT (deep venous thrombosis) (HCC)   HTN (hypertension)   Hyperlipidemia   Hypothyroidism   GERD (gastroesophageal reflux disease)   Hypokalemia   Melena   GI bleed   AVM (arteriovenous malformation) of  small bowel, acquired (HCC)  Symptomatic Anemia likely related to chronic GI bleed in a patient on Xarelto and Iron Deficiency Anemia  -Hemoglobin 4.3 on admission. MCV 73.7.  -Patient has been compliant with her Xarelto.  -Oxygen saturation level greater than 90% on room air.  -Complete 3 units of packed red blood cells -Hold Xarelto -PT/INR was 17.1 and 1.40 -Serial CBC showed Patient's Hb/Hct went from 4.3/14.6 -> 7.7/23.4 -> 8.1/25.5 -> 8.3/26.0 -Given IV Ferriheme by Gastroenterology -Restarted Home Ferrous Sulfate 325 mg po Daily -Continue to Monitor and repeat CBC in AM  GI Bleed/Melena.  -No episodes since she's been in the emergency department.  -She completed capsule study in [place]; Sister brought paperwork -FOBT Positive -See Above -S/p Transfusion 3 units -Gastroenterology evaluated and patient underwent Enteroscopy this AM which showed normal esophagus, stomach, duodenum, and the examined portion of the jejunum was normal and the reported jejunal lesions were not reached on the exam and no specimens were collected.  -Per Gastroenterology she still will need a Double-Balloon Enteroscopy at an academic institution to localize and treat the small bowel bleeding and ? Time whether inpatient vs. outpatient -Resume Regular Diet   Left Leg DVT.  -Diagnosed 4 months ago. Provoked vs unprovoked.  -Was Immobile due to knee and hip pain. Was on Xarelto.  -May benefit from Heme consult and/or evaluation for filter if anti-coag not an option. -Holding Xarelto and don't know if she will still need it or not  -Check Left Lower Extremity Venous Duplex to see if LE DVT still present (ordered 10/28 and still pending to be done)  -Monitor  Hypokalemia.  -K+ was 3.1 on Admission and improved to 3.8 -Continue to Monitor  and Replete as Necessary -Repeat CMP in AM  Hyperglycemia.  -Patient reports being "borderline diabetic" -Check HbA1c in AM.; Last HbA1c was 5.5 -Home medications  include Metformin -Monitor CBGs; CBG's ranging from 75-104 -Hold Metformin for now -C/w Sensitive Novolog Sliding Scale Insulin q4h for optimal control  Hypertension.  -Pressure on the high side of normal. BP was 163/71 -She reports noncompliance with medications. -Home meds include Lasix, Hydrochlorothiazide, Hydralazine, Toprol. Patient was tachycardic on presentation which is improved. -C/w Metoprolol Succinate 50 mg po Daily  -Resume Hydralazine and continue to Hold Hydrochlorothiazide and Lasix -Continue to Monitor Blood Pressure -Resume other home medications as indicated  Hypothyroidism.  -Home medications include Synthroid.  -Patient reports noncompliance with this medication -Obtained a TSH and was 0.359 -Resume Levothyroxine 112 mcg po Daily  GERD.  -C/w Protonix gtt -Defer to Gastroenterology to stop/change Protonix gtt  Hypomagnesemia. -Patient's Mag Level was 1.6 -Replete with IV Mag Sulfate 2 grams -Continue to Monitor and Replete as Necessary -Repeat Mag Level in AM  DVT prophylaxis: SCDs Code Status: FULL CODE Family Communication: No family present at bedside Disposition Plan: Remain Inpatient for Further workup  Consultants:   Gastroenterology Dr. Drenda FreezeGessner/Dr. Myrtie Neitheranis   Procedures:  Small Bowel Enteroscopy Findings:      The esophagus was normal.      The stomach was normal, including on retroflexion.      The examined duodenum was normal.      There was no evidence of significant pathology in the entire examined       portion of jejunum. Impression:               - Normal esophagus.                           - Normal stomach.                           - Normal examined duodenum.                           - The examined portion of the jejunum was normal.                            The reported jejunal lesions were not reached on                            this exam.                           - No specimens collected.   Antimicrobials:    Anti-infectives    None     Subjective: Seen and examined at bedside prior to her Enteroscopy and she was feeling ok. No abdominal pain and states she has not had any more blood. No CP or SOB. Feels ok and denies any other complaints.   Objective: Vitals:   12/06/16 0015 12/06/16 0413 12/06/16 1050 12/06/16 1247  BP: (!) 143/72 (!) 156/78  (!) 163/71  Pulse: 79 81  80  Resp: 18 18  18   Temp: 98.2 F (36.8 C) 98.1 F (36.7 C)  98.4 F (36.9 C)  TempSrc: Oral Oral Oral Oral  SpO2: 98% 100%  100%  Weight:      Height:  Intake/Output Summary (Last 24 hours) at 12/06/16 1259 Last data filed at 12/06/16 1239  Gross per 24 hour  Intake              250 ml  Output             4077 ml  Net            -3827 ml   Filed Weights   12/04/16 1946  Weight: 74.4 kg (164 lb 1.6 oz)   Examination: Physical Exam:  Constitutional: WN/WD AAF in NAD and appears calm Eyes: Sclerae anicteric. Lids normal ENMT: External Ears and nose appear normal. No JVD Neck: Supple with no JVD. Respiratory: CTAB; No wheezing/rales/rhonchi. Patient was not tachypenic or using any accessory muscles to breathe Cardiovascular: RRR; no appreciable m/r/g. Trace LE edema Abdomen: Soft, NT, ND. Bowel sounds present GU: Deferred Musculoskeletal: No contractures. No cyanosis Skin: No rashes or lesions. Has some vitiligo on LE. No induration   Neurologic: CN 2-12 grossly intact with no appreciable focal deficits Psychiatric: Normal mood and affect. Intact judgement and insight. Awake  Data Reviewed: I have personally reviewed following labs and imaging studies  CBC:  Recent Labs Lab 12/04/16 1251 12/04/16 2210 12/05/16 0431 12/06/16 0908  WBC 12.0* 8.9 8.2 8.0  NEUTROABS 8.6*  --   --  5.5  HGB 4.3* 7.7* 8.1* 8.3*  HCT 14.6* 23.4* 25.5* 26.0*  MCV 73.7* 78.0 78.2 79.0  PLT 618* 392 400 437*   Basic Metabolic Panel:  Recent Labs Lab 12/04/16 1251 12/05/16 0431 12/06/16 0908  NA 141 139  140  K 3.1* 4.0 3.8  CL 106 107 104  CO2 19* 24 27  GLUCOSE 137* 98 97  BUN 18 13 7   CREATININE 1.10* 0.73 0.73  CALCIUM 8.8* 8.7* 9.1  MG  --   --  1.6*  PHOS  --   --  3.3   GFR: Estimated Creatinine Clearance: 64.1 mL/min (by C-G formula based on SCr of 0.73 mg/dL). Liver Function Tests:  Recent Labs Lab 12/04/16 1251 12/06/16 0908  AST 20 15  ALT 11* 10*  ALKPHOS 75 72  BILITOT 0.5 1.1  PROT 6.5 6.4*  ALBUMIN 2.9* 2.9*    Recent Labs Lab 12/04/16 1251  LIPASE 16   No results for input(s): AMMONIA in the last 168 hours. Coagulation Profile:  Recent Labs Lab 12/04/16 1836  INR 1.40   Cardiac Enzymes: No results for input(s): CKTOTAL, CKMB, CKMBINDEX, TROPONINI in the last 168 hours. BNP (last 3 results) No results for input(s): PROBNP in the last 8760 hours. HbA1C: No results for input(s): HGBA1C in the last 72 hours. CBG:  Recent Labs Lab 12/05/16 2007 12/06/16 0012 12/06/16 0411 12/06/16 0757 12/06/16 1147  GLUCAP 94 110* 104* 90 75   Lipid Profile: No results for input(s): CHOL, HDL, LDLCALC, TRIG, CHOLHDL, LDLDIRECT in the last 72 hours. Thyroid Function Tests:  Recent Labs  12/04/16 1836  TSH 0.359   Anemia Panel: No results for input(s): VITAMINB12, FOLATE, FERRITIN, TIBC, IRON, RETICCTPCT in the last 72 hours. Sepsis Labs: No results for input(s): PROCALCITON, LATICACIDVEN in the last 168 hours.  No results found for this or any previous visit (from the past 240 hour(s)).   Radiology Studies: Portable Chest 1 View  Result Date: 12/04/2016 CLINICAL DATA:  Dyspnea which shortness-of-breath, lightheadedness and weakness today. EXAM: PORTABLE CHEST 1 VIEW COMPARISON:  None. FINDINGS: Lungs are adequately inflated without focal consolidation or effusion. Cardiomediastinal silhouette is within normal.  There is calcified plaque over the thoracic aorta. There are degenerative changes of the spine and shoulders. IMPRESSION: No acute  cardiopulmonary disease. Electronically Signed   By: Elberta Fortis M.D.   On: 12/04/2016 17:14   Scheduled Meds: . [MAR Hold] allopurinol  100 mg Oral Daily  . [MAR Hold] insulin aspart  0-9 Units Subcutaneous Q4H  . [MAR Hold] levothyroxine  100 mcg Oral QAC breakfast  . [MAR Hold] metoprolol succinate  50 mg Oral Daily  . [MAR Hold] pravastatin  20 mg Oral QHS   Continuous Infusions: . sodium chloride    . pantoprozole (PROTONIX) infusion 8 mg/hr (12/06/16 0025)    LOS: 2 days   Merlene Laughter, DO [facility] Pager [phone]  If 7PM-7AM, please contact night-coverage [email or web address] Password [email or web address] 12/06/2016, 12:59 PM

## 2016-12-07 ENCOUNTER — Inpatient Hospital Stay (HOSPITAL_COMMUNITY): Payer: Medicare Other

## 2016-12-07 ENCOUNTER — Encounter (HOSPITAL_COMMUNITY): Payer: Self-pay | Admitting: Physician Assistant

## 2016-12-07 DIAGNOSIS — D500 Iron deficiency anemia secondary to blood loss (chronic): Principal | ICD-10-CM

## 2016-12-07 DIAGNOSIS — M25551 Pain in right hip: Secondary | ICD-10-CM

## 2016-12-07 DIAGNOSIS — M25561 Pain in right knee: Secondary | ICD-10-CM

## 2016-12-07 HISTORY — DX: Pain in right hip: M25.551

## 2016-12-07 HISTORY — DX: Pain in right knee: M25.561

## 2016-12-07 LAB — COMPREHENSIVE METABOLIC PANEL
ALBUMIN: 2.6 g/dL — AB (ref 3.5–5.0)
ALT: 10 U/L — ABNORMAL LOW (ref 14–54)
AST: 14 U/L — ABNORMAL LOW (ref 15–41)
Alkaline Phosphatase: 72 U/L (ref 38–126)
Anion gap: 8 (ref 5–15)
BILIRUBIN TOTAL: 0.6 mg/dL (ref 0.3–1.2)
BUN: 7 mg/dL (ref 6–20)
CHLORIDE: 106 mmol/L (ref 101–111)
CO2: 25 mmol/L (ref 22–32)
Calcium: 8.7 mg/dL — ABNORMAL LOW (ref 8.9–10.3)
Creatinine, Ser: 0.72 mg/dL (ref 0.44–1.00)
GFR calc Af Amer: >60 mL/min (ref >60)
GLUCOSE: 106 mg/dL — AB (ref 65–99)
POTASSIUM: 3.6 mmol/L (ref 3.5–5.1)
Sodium: 139 mmol/L (ref 135–145)
TOTAL PROTEIN: 6 g/dL — AB (ref 6.5–8.1)

## 2016-12-07 LAB — CBC WITH DIFFERENTIAL/PLATELET
BASOS PCT: 0 %
Basophils Absolute: 0 10*3/uL (ref 0.0–0.1)
EOS ABS: 0.2 10*3/uL (ref 0.0–0.7)
EOS PCT: 2 %
HEMATOCRIT: 24.7 % — AB (ref 36.0–46.0)
Hemoglobin: 7.8 g/dL — ABNORMAL LOW (ref 12.0–15.0)
Lymphocytes Relative: 19 %
Lymphs Abs: 1.6 10*3/uL (ref 0.7–4.0)
MCH: 25.4 pg — ABNORMAL LOW (ref 26.0–34.0)
MCHC: 31.6 g/dL (ref 30.0–36.0)
MCV: 80.5 fL (ref 78.0–100.0)
MONO ABS: 0.7 10*3/uL (ref 0.1–1.0)
MONOS PCT: 9 %
NEUTROS ABS: 6 10*3/uL (ref 1.7–7.7)
Neutrophils Relative %: 70 %
PLATELETS: 413 10*3/uL — AB (ref 150–400)
RBC: 3.07 MIL/uL — ABNORMAL LOW (ref 3.87–5.11)
RDW: 16.5 % — AB (ref 11.5–15.5)
WBC: 8.5 10*3/uL (ref 4.0–10.5)

## 2016-12-07 LAB — GLUCOSE, CAPILLARY
GLUCOSE-CAPILLARY: 120 mg/dL — AB (ref 65–99)
Glucose-Capillary: 101 mg/dL — ABNORMAL HIGH (ref 65–99)
Glucose-Capillary: 108 mg/dL — ABNORMAL HIGH (ref 65–99)
Glucose-Capillary: 113 mg/dL — ABNORMAL HIGH (ref 65–99)
Glucose-Capillary: 130 mg/dL — ABNORMAL HIGH (ref 65–99)
Glucose-Capillary: 98 mg/dL (ref 65–99)

## 2016-12-07 LAB — PHOSPHORUS: Phosphorus: 3.5 mg/dL (ref 2.5–4.6)

## 2016-12-07 LAB — HEMOGLOBIN A1C
HEMOGLOBIN A1C: 5.5 % (ref 4.8–5.6)
MEAN PLASMA GLUCOSE: 111.15 mg/dL

## 2016-12-07 LAB — HEMOGLOBIN AND HEMATOCRIT, BLOOD
HEMATOCRIT: 31.7 % — AB (ref 36.0–46.0)
HEMOGLOBIN: 9.9 g/dL — AB (ref 12.0–15.0)

## 2016-12-07 LAB — PREPARE RBC (CROSSMATCH)

## 2016-12-07 LAB — MAGNESIUM: MAGNESIUM: 1.5 mg/dL — AB (ref 1.7–2.4)

## 2016-12-07 MED ORDER — MAGNESIUM SULFATE 2 GM/50ML IV SOLN
2.0000 g | Freq: Once | INTRAVENOUS | Status: AC
  Administered 2016-12-07: 2 g via INTRAVENOUS
  Filled 2016-12-07: qty 50

## 2016-12-07 MED ORDER — MORPHINE SULFATE (PF) 2 MG/ML IV SOLN
2.0000 mg | INTRAVENOUS | Status: DC | PRN
  Administered 2016-12-07 – 2016-12-08 (×2): 2 mg via INTRAVENOUS
  Filled 2016-12-07 (×2): qty 1

## 2016-12-07 MED ORDER — SODIUM CHLORIDE 0.9 % IV SOLN
Freq: Once | INTRAVENOUS | Status: AC
  Administered 2016-12-07: 16:00:00 via INTRAVENOUS

## 2016-12-07 MED ORDER — TRAMADOL HCL 50 MG PO TABS
50.0000 mg | ORAL_TABLET | Freq: Four times a day (QID) | ORAL | Status: DC | PRN
  Administered 2016-12-07 – 2016-12-10 (×3): 50 mg via ORAL
  Filled 2016-12-07 (×4): qty 1

## 2016-12-07 MED ORDER — FAMOTIDINE 20 MG PO TABS
40.0000 mg | ORAL_TABLET | Freq: Every day | ORAL | Status: DC
  Administered 2016-12-07 – 2016-12-10 (×4): 40 mg via ORAL
  Filled 2016-12-07 (×5): qty 2

## 2016-12-07 NOTE — Anesthesia Postprocedure Evaluation (Signed)
Anesthesia Post Note  Patient: Dana Nelson  Procedure(s) Performed: ENTEROSCOPY (N/A )     Patient location during evaluation: PACU Anesthesia Type: MAC Level of consciousness: awake and alert Pain management: pain level controlled Vital Signs Assessment: post-procedure vital signs reviewed and stable Respiratory status: spontaneous breathing, nonlabored ventilation, respiratory function stable and patient connected to nasal cannula oxygen Cardiovascular status: stable and blood pressure returned to baseline Postop Assessment: no apparent nausea or vomiting Anesthetic complications: no    Last Vitals:  Vitals:   12/06/16 2030 12/07/16 0424  BP: (!) 144/68 (!) 157/82  Pulse: 83 82  Resp: 19 19  Temp: 36.8 C 36.7 C  SpO2: 95% 98%    Last Pain:  Vitals:   12/07/16 0747  TempSrc:   PainSc: 10-Worst pain ever                 Rosio Weiss P Briahna Pescador

## 2016-12-07 NOTE — Progress Notes (Signed)
PROGRESS NOTE    Dana Nelson  ZOX:[id number] DOB: 1945/06/08 DOA: 12/04/2016 PCP: Patient, No Pcp Per  Brief Narrative:  Dana Nelson is a very pleasant 71 y.o. female with medical history significant for DVT recently on Xarelto, Hypertension, Hyperlipidemia, Hypothyroidism, Chronic GI bleed with iron deficiency anemia presents to emergency Department chief complaint melena. Initial evaluation reveals hemoglobin of 4.3. TRH was asked to admit  Patient reported generalized weakness onset yesterday along with dark maroon stool. Associated symptoms include dizziness nausea without vomiting shortness of breath with exertion. She also reports some noncompliance with her home medications except for the Xarelto. Patient denies headache visual disturbances syncope or near-syncope. She denies chest pain palpitation, abdominal pain dysuria hematuria frequency or urgency. She denies any lower extremity edema. She states she just relocated from [place] where she had a capsule study that indicated "lower part of my stomach inflamed". Reports she's had an EGD this year when that was unremarkable as well as a colonoscopy that had to polyps and diverticulosis. Was admitted in July for angioedema and incidental finding was a hemoglobin of 6.3 at which time she underwent transfusion.  Gastroenterology was consulted for further evaluation and plan is for Enteroscopy in AM. She was given IV Iron 10/28. Will be checking Bilateral Lower Extremity Duplex to see if DVT is still there.   Patient underwent enteroscopy 10/29 and was essentially normal. Lower Extremity Duplex on Left side showed no DVT so will not need long term Anticoagulation. Patient complained of Extreme Hip and Knee pain today so X-Rays were obtained. Patient's Anemia is preventing her from getting hip surgery but don't have a source and suspects that she will need a double balloon enteroscopy but will need previous records. GI recommends discharge home  and follow up with Dr. Leone Payor as an outpatient but will recheck CBC in AM and assess patient's pain prior to D/C'ing home. Will also get PT evaluation prior to D/C.    Assessment & Plan:   Principal Problem:   Blood loss anemia Active Problems:   DVT (deep venous thrombosis) (HCC)   HTN (hypertension)   Hyperlipidemia   Hypothyroidism   GERD (gastroesophageal reflux disease)   Hypokalemia   Melena   GI bleed   AVM (arteriovenous malformation) of small bowel, acquired (HCC)   Right knee pain   Right hip pain  Symptomatic Anemia likely related to chronic GI bleed in a patient on Xarelto and Iron Deficiency Anemia  -Hemoglobin 4.3 on admission. MCV 73.7.  -Patient has been compliant with her Xarelto.  -Oxygen saturation level greater than 90% on room air.  -Complete 3 units of packed red blood cells; Will be transfused another unit by Gastroenterology  -Hold Xarelto and Discontinue altogether given no DVT on recent scan -PT/INR was 17.1 and 1.40 -Serial CBC showed Patient's Hb/Hct went from 4.3/14.6 -> 7.7/23.4 -> 8.1/25.5 -> 8.3/26.0 -> 7.8/24.7 -Given IV Ferriheme by Gastroenterology -Restarted Home Ferrous Sulfate 325 mg po Daily -Continue to Monitor and repeat CBC in AM  GI Bleed/Melena.  -No episodes since she's been in the emergency department.  -She completed capsule study in [place]; Sister brought paperwork -FOBT Positive -See Above -S/p Transfusion 3 units and will get another unit.  -Gastroenterology evaluated and patient underwent Enteroscopy this AM which showed normal esophagus, stomach, duodenum, and the examined portion of the jejunum was normal and the reported jejunal lesions were not reached on the exam and no specimens were collected.  -PER GI Report Review had one AVM in  the Jejunum on Capsule study last month -Per Gastroenterology she still will need a Double-Balloon Enteroscopy at an academic institution to localize and treat the small bowel bleeding and  final decision per GI depends on clinical progress and receipt of previous endoscopic records -Resume Regular Diet   Left Leg DVT.  -Diagnosed 4 months ago. Provoked vs unprovoked.  -Was Immobile due to knee and hip pain. Was on Xarelto.  -May benefit from Heme consult and/or evaluation for filter if anti-coag not an option. -Holding Xarelto and don't know if she will still need it or not  -Check Left Lower Extremity Venous Duplex and DVT not present  -Monitor  Hypokalemia.  -K+ was 3.1 on Admission and improved to 3.6 -Continue to Monitor and Replete as Necessary -Repeat CMP in AM  Hyperglycemia.  -Patient reports being "borderline diabetic" -Check HbA1c 5.5.; -Home medications include Metformin -Monitor CBGs; CBG's ranging from 98-130 -Hold Metformin for now -C/w Sensitive Novolog Sliding Scale Insulin q4h for optimal control  Hypertension.  -Pressure on the high side of normal. BP was 163/71 -She reports noncompliance with medications. -Home meds include Lasix, Hydrochlorothiazide, Hydralazine, Toprol. Patient was tachycardic on presentation which is improved. -C/w Metoprolol Succinate 50 mg po Daily  -Resume Hydralazine and continue to Hold Hydrochlorothiazide and Lasix -Continue to Monitor Blood Pressure -Resume other home medications as indicated  Hypothyroidism.  -Home medications include Synthroid.  -Patient reports noncompliance with this medication -Obtained a TSH and was 0.359 -Resume Levothyroxine 112 mcg po Daily  GERD.  -Protonix gtt discontinued by Gastroenterology   Hypomagnesemia. -Patient's Mag Level was 1.5 -Replete with IV Mag Sulfate 2 grams -Continue to Monitor and Replete as Necessary -Repeat Mag Level in AM  R Hip Pain -Obtain Hip X-Rays -Severe narrowing of both hip joints is noted consistent with severe degenerative joint disease. Sclerosis and lucency is seen involving both femoral heads suggesting avascular necrosis. No acute  fracture or dislocation is noted -Pain Control with Tramadol and Morphine -Difficult Situation as need to find source of Bleeding prior to Hip and Knee Replacements as no Orthopedic will operate prior to GI Bleed Issue being resolved  -Obtain PT/OT Evaluation and Treat  R Knee Pain -Obtain Knee X-Rays -Showed Moderate to severe degenerative joint disease. No acute abnormality -Pain Control with Tramadol and Morphine -Obtain PT/OT Evaluation and Treat  DVT prophylaxis: SCDs Code Status: FULL CODE Family Communication: No family present at bedside Disposition Plan: Remain Inpatient for Further workup  Consultants:   Gastroenterology Dr. Drenda FreezeGessner/Dr. Myrtie Neitheranis   Procedures:  Small Bowel Enteroscopy Findings:      The esophagus was normal.      The stomach was normal, including on retroflexion.      The examined duodenum was normal.      There was no evidence of significant pathology in the entire examined       portion of jejunum. Impression:               - Normal esophagus.                           - Normal stomach.                           - Normal examined duodenum.                           - The  examined portion of the jejunum was normal.                            The reported jejunal lesions were not reached on                            this exam.                           - No specimens collected.   Antimicrobials:  Anti-infectives    None     Subjective: Seen and examined at bedside today and was very tearful and crying because of Hip Pain. Frustrated with her situation. No CP or SOB. No Abdominal Pain. Has not had another bowel movement yet.   Objective: Vitals:   12/07/16 1521 12/07/16 1555 12/07/16 1832 12/07/16 2030  BP: 140/67 (!) 142/71 (!) 149/73 (!) 165/80  Pulse: 76 71 75 79  Resp: 14 18 16 18   Temp: 98.4 F (36.9 C) 98.3 F (36.8 C) 98.9 F (37.2 C) 99.4 F (37.4 C)  TempSrc: Oral Oral Oral Oral  SpO2: 99% 100% 98% 94%  Weight:      Height:         Intake/Output Summary (Last 24 hours) at 12/07/16 2113 Last data filed at 12/07/16 1828  Gross per 24 hour  Intake           722.92 ml  Output              850 ml  Net          -127.08 ml   Filed Weights   12/04/16 1946  Weight: 74.4 kg (164 lb 1.6 oz)   Examination: Physical Exam:  Constitutional: WN/WD AAF who is tearful and frustrated with her situation.  Eyes: Sclerae anicteric. Lids normal ENMT: External Ears and nose appear normal. No JVD Neck: Supple with no JVD Respiratory: CTAB; No appreciable wheezing/rales/rhonchi. Patient was not tachypenic or using any accessory muscles to breathe Cardiovascular: RRR; No appreciable m/r/g. No appreciable LE edema Abdomen: Soft, NT, ND. Bowel sounds present GU: Deferred Musculoskeletal: No contractures. No cyanosis Skin: No rashes or Lesions. Has some vitiligo on LE and Upper extremities.  Neurologic: CN 2-12 grossly intact. No appreciable focal deficits Psychiatric: Tearful mood and affect. Intact judgement and insight  Data Reviewed: I have personally reviewed following labs and imaging studies  CBC:  Recent Labs Lab 12/04/16 1251 12/04/16 2210 12/05/16 0431 12/06/16 0908 12/06/16 1629 12/07/16 0510  WBC 12.0* 8.9 8.2 8.0  --  8.5  NEUTROABS 8.6*  --   --  5.5  --  6.0  HGB 4.3* 7.7* 8.1* 8.3* 8.4* 7.8*  HCT 14.6* 23.4* 25.5* 26.0* 26.3* 24.7*  MCV 73.7* 78.0 78.2 79.0  --  80.5  PLT 618* 392 400 437*  --  413*   Basic Metabolic Panel:  Recent Labs Lab 12/04/16 1251 12/05/16 0431 12/06/16 0908 12/07/16 0510  NA 141 139 140 139  K 3.1* 4.0 3.8 3.6  CL 106 107 104 106  CO2 19* 24 27 25   GLUCOSE 137* 98 97 106*  BUN 18 13 7 7   CREATININE 1.10* 0.73 0.73 0.72  CALCIUM 8.8* 8.7* 9.1 8.7*  MG  --   --  1.6* 1.5*  PHOS  --   --  3.3 3.5   GFR: Estimated Creatinine Clearance: 64.1 mL/min (  by C-G formula based on SCr of 0.72 mg/dL). Liver Function Tests:  Recent Labs Lab 12/04/16 1251 12/06/16 0908  12/07/16 0510  AST 20 15 14*  ALT 11* 10* 10*  ALKPHOS 75 72 72  BILITOT 0.5 1.1 0.6  PROT 6.5 6.4* 6.0*  ALBUMIN 2.9* 2.9* 2.6*    Recent Labs Lab 12/04/16 1251  LIPASE 16   No results for input(s): AMMONIA in the last 168 hours. Coagulation Profile:  Recent Labs Lab 12/04/16 1836  INR 1.40   Cardiac Enzymes: No results for input(s): CKTOTAL, CKMB, CKMBINDEX, TROPONINI in the last 168 hours. BNP (last 3 results) No results for input(s): PROBNP in the last 8760 hours. HbA1C:  Recent Labs  12/07/16 0510  HGBA1C 5.5   CBG:  Recent Labs Lab 12/07/16 0419 12/07/16 0740 12/07/16 1132 12/07/16 1631 12/07/16 1944  GLUCAP 101* 98 113* 120* 130*   Lipid Profile: No results for input(s): CHOL, HDL, LDLCALC, TRIG, CHOLHDL, LDLDIRECT in the last 72 hours. Thyroid Function Tests: No results for input(s): TSH, T4TOTAL, FREET4, T3FREE, THYROIDAB in the last 72 hours. Anemia Panel: No results for input(s): VITAMINB12, FOLATE, FERRITIN, TIBC, IRON, RETICCTPCT in the last 72 hours. Sepsis Labs: No results for input(s): PROCALCITON, LATICACIDVEN in the last 168 hours.  No results found for this or any previous visit (from the past 240 hour(s)).   Radiology Studies: Dg Knee Complete 4 Views Right  Result Date: 12/07/2016 CLINICAL DATA:  Chronic right knee pain. EXAM: RIGHT KNEE - COMPLETE 4+ VIEW COMPARISON:  None. FINDINGS: No evidence of fracture, dislocation, or joint effusion. Severe narrowing of patellofemoral space is noted with osteophyte formation. Moderate narrowing of the medial and lateral joint spaces is noted with osteophyte formation. Soft tissues are unremarkable. IMPRESSION: Moderate to severe degenerative joint disease. No acute abnormality seen in the right knee. Electronically Signed   By: Lupita Raider, M.D.   On: 12/07/2016 14:43   Dg Hip Unilat With Pelvis 2-3 Views Right  Result Date: 12/07/2016 CLINICAL DATA:  Chronic right hip pain. EXAM: DG HIP  (WITH OR WITHOUT PELVIS) 2-3V RIGHT COMPARISON:  None. FINDINGS: Severe narrowing of both hip joints is noted consistent with severe degenerative joint disease. Sclerosis and lucency is seen involving both femoral heads suggesting avascular necrosis. 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 No acute abnormality is noted. Electronically Signed   By: Lupita Raider, M.D.   On: 12/07/2016 14:42   Scheduled Meds: . allopurinol  100 mg Oral Daily  . famotidine  40 mg Oral Daily  . ferrous sulfate  325 mg Oral Daily  . hydrALAZINE  25 mg Oral BID  . insulin aspart  0-9 Units Subcutaneous Q4H  . levothyroxine  112 mcg Oral QAC breakfast  . metoprolol succinate  50 mg Oral Daily  . pravastatin  20 mg Oral QHS   Continuous Infusions:   LOS: 3 days   Merlene Laughter, DO [facility] Pager [phone]  If 7PM-7AM, please contact night-coverage [email or web address] Password [email or web address] 12/07/2016, 9:13 PM

## 2016-12-07 NOTE — Progress Notes (Signed)
Daily Rounding Note  12/07/2016, 9:43 AM  LOS: 3 days   SUBJECTIVE:   Chief complaint: Melena  Frustrated by recurrent GI bleeding, anemia that has delayed needed bil hip vs kne replacements for several months.   Denies weakness or pain.  No abd pain or nausea.   Last stool was on Saturday, 3 days ago, it was burgundy/bloody then.  She had severe pain in her right hip last evening requiring opioids. This has exacerbated her frustration about her inability to get hip replacement due to the anemia and then blood clot. She also admits that she has "not been making time for myself, looking after my daughter".  OBJECTIVE:         Vital signs in last 24 hours:    Temp:  [98.1 F (36.7 C)-98.4 F (36.9 C)] 98.1 F (36.7 C) (10/30 0424) Pulse Rate:  [69-83] 82 (10/30 0424) Resp:  [18-19] 19 (10/30 0424) BP: (116-178)/(67-89) 157/82 (10/30 0424) SpO2:  [95 %-100 %] 98 % (10/30 0424) Last BM Date: 12/05/16 Filed Weights   12/04/16 1946  Weight: 74.4 kg (164 lb 1.6 oz)   General: pressured speech,  Seems depressed/frustrated/angry.  Near tears telling her story.   Heart: RRR Chest: clear bil.  No dyspnea Abdomen: NT,  soft, normal bowel sounds Extremities: thin lower legs, lack muscle tone.  Deformity of knees and legs c/w deg disease.   Neuro/Psych:  Anxious, depressed.  No cognitive deficits.    Intake/Output from previous day: 10/29 0701 - 10/30 0700 In: 1832.5 [P.O.:290; I.V.:1542.5] Out: 1577 [Urine:1575; Blood:2]  Intake/Output this shift: No intake/output data recorded.  Lab Results:  Recent Labs  12/05/16 0431 12/06/16 0908 12/06/16 1629 12/07/16 0510  WBC 8.2 8.0  --  8.5  HGB 8.1* 8.3* 8.4* 7.8*  HCT 25.5* 26.0* 26.3* 24.7*  PLT 400 437*  --  413*   BMET  Recent Labs  12/05/16 0431 12/06/16 0908 12/07/16 0510  NA 139 140 139  K 4.0 3.8 3.6  CL 107 104 106  CO2 24 27 25   GLUCOSE 98 97 106*    BUN 13 7 7   CREATININE 0.73 0.73 0.72  CALCIUM 8.7* 9.1 8.7*   LFT  Recent Labs  12/04/16 1251 12/06/16 0908 12/07/16 0510  PROT 6.5 6.4* 6.0*  ALBUMIN 2.9* 2.9* 2.6*  AST 20 15 14*  ALT 11* 10* 10*  ALKPHOS 75 72 72  BILITOT 0.5 1.1 0.6   PT/INR  Recent Labs  12/04/16 1836  LABPROT 17.1*  INR 1.40   Hepatitis Panel No results for input(s): HEPBSAG, HCVAB, HEPAIGM, HEPBIGM in the last 72 hours.  Studies/Results: No results found.   Scheduled Meds: . allopurinol  100 mg Oral Daily  . ferrous sulfate  325 mg Oral Daily  . hydrALAZINE  25 mg Oral BID  . insulin aspart  0-9 Units Subcutaneous Q4H  . levothyroxine  112 mcg Oral QAC breakfast  . metoprolol succinate  50 mg Oral Daily  . pravastatin  20 mg Oral QHS   Continuous Infusions: . magnesium sulfate 1 - 4 g bolus IVPB     PRN Meds:.acetaminophen **OR** acetaminophen, morphine injection, ondansetron **OR** ondansetron (ZOFRAN) IV, traMADol, traZODone  Lower extremity ultrasound yesterday shows no clot in the left leg.  ASSESMENT:   *   Melena, hematochezia.  AVM reported on outside CE last month  EGD ~ 06/2016 Dr Sundra Aland in [place]: Gastritis.   Colon ~ 06/2016:  removed colon polyp (type not defined) GCE 8/23 with severe antral gastritis, duodenitis (along with non-bleeding jejunal AVMs 10/29 Enteroscopy: Normal into jejunum.  Reported bleeding jejunal lesion not encountered/reached. Previous endoscopic workup in summer 2017 and spring/summer 2018 in [place] (colon, EGD, capsule endoscopy).   10/29 Enteroscopy: Normal into jejunum.  Reported bleeding jejunal lesion not encountered/reached. Previous endoscopic workup in summer 2017 and spring/summer 2018 in [place] (colon, EGD, capsule endoscopy).   [facility], ? Timing. ? invs outpt?   *  Chronic OAC, Xarelto, for hx DVT 06/2016.  On hold since admission.  *  Acute on chronic anemia from blood  loss, issue dating to Summer 2017 (pre xarelto).  On po Iron 1 x daily PTA and now.  S/p Feraheme 10/28.   S/p PRBC x 3.  Hgb 4.3 >. 8.4 >> 7.8  *  Deg joint dz of bil  Knees and hips and left rot cuff injury needing replacement.  Limits her ambulation Has no local ortho MD yet.  No ortho will surgerize her until issue of GI bleed put to rest.     *  Depression.  Multifactorial.  Obese dtr in [place] after septic shock, pt's personal health challenges.     PLAN   *  q 12 hour H/H.   Stop IV Protonix drip.  ? Restart a daily PPI or H2 blocker given GCE 8/23 with severe antral gastritis, duodenitis (along with non-bleeding jejunal AVMs.  Will start with H2 blocker.    *  Hospital[facility], considering Heme consult.   Jennye MoccasinSarah Gribbin  12/07/2016, 9:43 AM Pager: [phone]  I have discussed the case with the PA, and that is the plan I formulated. I personally interviewed and examined the patient.  I have made some changes to the original note from this morning to reflect my conversation and exam findings.  This patient is understandably frustrated about this ongoing issue. It is unfortunate that her care has been fragmented over multiple institutions that do not share records. So far, we have not received the actual upper endoscopy and colonoscopy reports done in [place] in May. A request for records only yielded some office notes that made reference to those endoscopic findings.  The patient's overt GI bleeding has stopped off oral anticoagulation. I believe that gives us some time to figure out on next move. There was reportedly at least one AVM seen in the jejunum on capsule study done last month in [place]. Unless the patient had an alternate source of overt GI bleeding at that time, the AVMs would seem to be the source of her recurrent obscure GI bleeding and chronic iron deficiency anemia. She is in a difficult situation because anemia currently precludes her hip  replacement. In addition, she would need to be on short term anticoagulation after a joint replacement, which would present a risk for recurrent overt bleeding. Therefore, I explained to her how I think a hopeful identification and resolution of the bleeding source is necessary before proceeding with orthopedic surgery. When presented to her that way, she understood the complexity of issues and the reason we are proceeding as we are.  While she is not having any more overt bleeding, and I believe her hemoglobin is equilibrating over the last couple of days and I am giving her one more unit of PRBCs. As there is no current DVT, she does not appear to need oral anticoagulation  in the near future.  After being transfused blood, I believe she can be discharged home from a GI perspective. Dr. Leone Payor would like her to have a CBC early next week done at our outpatient lab, then he will make plans for close outpatient follow-up with her. I suspect she will need a double balloon enteroscopy at an academic institution such as [facility] or [facility], but final decision regarding this will depend upon her clinical progress and receipt of previous endoscopic records to make a more confident decision.  Total time 40 minutes face-to-face, over half spent in discussion with this patient and summary of her issues and proposed plan.    Charlie Pitter III Pager [phone]  Mon-Fri 8a-5p [phone] after 5p, weekends, holidays

## 2016-12-08 ENCOUNTER — Other Ambulatory Visit: Payer: Self-pay | Admitting: Physician Assistant

## 2016-12-08 DIAGNOSIS — E038 Other specified hypothyroidism: Secondary | ICD-10-CM

## 2016-12-08 DIAGNOSIS — E039 Hypothyroidism, unspecified: Secondary | ICD-10-CM

## 2016-12-08 DIAGNOSIS — E876 Hypokalemia: Secondary | ICD-10-CM

## 2016-12-08 DIAGNOSIS — D500 Iron deficiency anemia secondary to blood loss (chronic): Secondary | ICD-10-CM

## 2016-12-08 DIAGNOSIS — K219 Gastro-esophageal reflux disease without esophagitis: Secondary | ICD-10-CM

## 2016-12-08 DIAGNOSIS — M170 Bilateral primary osteoarthritis of knee: Secondary | ICD-10-CM

## 2016-12-08 DIAGNOSIS — I82402 Acute embolism and thrombosis of unspecified deep veins of left lower extremity: Secondary | ICD-10-CM

## 2016-12-08 DIAGNOSIS — D62 Acute posthemorrhagic anemia: Secondary | ICD-10-CM

## 2016-12-08 DIAGNOSIS — M25561 Pain in right knee: Secondary | ICD-10-CM

## 2016-12-08 DIAGNOSIS — I10 Essential (primary) hypertension: Secondary | ICD-10-CM

## 2016-12-08 LAB — TYPE AND SCREEN
ABO/RH(D): O POS
ANTIBODY SCREEN: NEGATIVE
UNIT DIVISION: 0
Unit division: 0
Unit division: 0
Unit division: 0
Unit division: 0

## 2016-12-08 LAB — CBC WITH DIFFERENTIAL/PLATELET
BASOS ABS: 0.1 10*3/uL (ref 0.0–0.1)
Basophils Relative: 1 %
Eosinophils Absolute: 0.2 10*3/uL (ref 0.0–0.7)
Eosinophils Relative: 2 %
HEMATOCRIT: 33.5 % — AB (ref 36.0–46.0)
Hemoglobin: 10.9 g/dL — ABNORMAL LOW (ref 12.0–15.0)
LYMPHS ABS: 1.8 10*3/uL (ref 0.7–4.0)
Lymphocytes Relative: 16 %
MCH: 26.3 pg (ref 26.0–34.0)
MCHC: 32.5 g/dL (ref 30.0–36.0)
MCV: 80.7 fL (ref 78.0–100.0)
MONOS PCT: 8 %
Monocytes Absolute: 0.9 10*3/uL (ref 0.1–1.0)
Neutro Abs: 8.2 10*3/uL — ABNORMAL HIGH (ref 1.7–7.7)
Neutrophils Relative %: 73 %
PLATELETS: ADEQUATE 10*3/uL (ref 150–400)
RBC: 4.15 MIL/uL (ref 3.87–5.11)
RDW: 17.5 % — AB (ref 11.5–15.5)
WBC: 11.2 10*3/uL — AB (ref 4.0–10.5)

## 2016-12-08 LAB — GLUCOSE, CAPILLARY
GLUCOSE-CAPILLARY: 136 mg/dL — AB (ref 65–99)
Glucose-Capillary: 101 mg/dL — ABNORMAL HIGH (ref 65–99)
Glucose-Capillary: 104 mg/dL — ABNORMAL HIGH (ref 65–99)
Glucose-Capillary: 105 mg/dL — ABNORMAL HIGH (ref 65–99)
Glucose-Capillary: 111 mg/dL — ABNORMAL HIGH (ref 65–99)
Glucose-Capillary: 114 mg/dL — ABNORMAL HIGH (ref 65–99)
Glucose-Capillary: 90 mg/dL (ref 65–99)

## 2016-12-08 LAB — COMPREHENSIVE METABOLIC PANEL
ALBUMIN: 2.7 g/dL — AB (ref 3.5–5.0)
ALT: 11 U/L — ABNORMAL LOW (ref 14–54)
AST: 15 U/L (ref 15–41)
Alkaline Phosphatase: 70 U/L (ref 38–126)
Anion gap: 9 (ref 5–15)
BUN: 9 mg/dL (ref 6–20)
CHLORIDE: 103 mmol/L (ref 101–111)
CO2: 25 mmol/L (ref 22–32)
Calcium: 8.9 mg/dL (ref 8.9–10.3)
Creatinine, Ser: 0.75 mg/dL (ref 0.44–1.00)
GFR calc Af Amer: >60 mL/min (ref >60)
GFR calc non Af Amer: >60 mL/min (ref >60)
GLUCOSE: 102 mg/dL — AB (ref 65–99)
POTASSIUM: 4.1 mmol/L (ref 3.5–5.1)
SODIUM: 137 mmol/L (ref 135–145)
Total Bilirubin: 0.6 mg/dL (ref 0.3–1.2)
Total Protein: 6 g/dL — ABNORMAL LOW (ref 6.5–8.1)

## 2016-12-08 LAB — BPAM RBC
BLOOD PRODUCT EXPIRATION DATE: 201811212359
BLOOD PRODUCT EXPIRATION DATE: 201811282359
Blood Product Expiration Date: 201811282359
Blood Product Expiration Date: 201811292359
Blood Product Expiration Date: 201811292359
ISSUE DATE / TIME: 201810270410
ISSUE DATE / TIME: 201810271624
ISSUE DATE / TIME: 201810271402
ISSUE DATE / TIME: 201810271508
ISSUE DATE / TIME: 201810301517
UNIT TYPE AND RH: 5100
UNIT TYPE AND RH: 5100
UNIT TYPE AND RH: 5100
Unit Type and Rh: 5100
Unit Type and Rh: 5100

## 2016-12-08 LAB — MAGNESIUM: MAGNESIUM: 1.8 mg/dL (ref 1.7–2.4)

## 2016-12-08 MED ORDER — HYDRALAZINE HCL 20 MG/ML IJ SOLN
15.0000 mg | Freq: Once | INTRAMUSCULAR | Status: DC
  Filled 2016-12-08: qty 1

## 2016-12-08 MED ORDER — HYDRALAZINE HCL 20 MG/ML IJ SOLN: 10.0000 mg | Freq: Four times a day (QID) | INTRAMUSCULAR | Status: DC | PRN

## 2016-12-08 NOTE — Progress Notes (Signed)
OT Cancellation Note  Patient Details Name: Dana EpleyRegina Nelson MRN: [id number] DOB: 01/23/1946   Cancelled Treatment:    Reason Eval/Treat Not Completed: Fatigue/lethargy limiting ability to participate.  Pt fatigued after PT. Will reattempt.  Reynolds AmericanWendi Lilymae Swiech, OTR/L [phone]   Jeani HawkingConarpe, Kaitlynne Wenz M 12/08/2016, 6:03 PM

## 2016-12-08 NOTE — Evaluation (Signed)
Physical Therapy Evaluation Patient Details Name: Dana Nelson MRN: [id number] DOB: Jan 16, 1946 Today's Date: 12/08/2016   History of Present Illness  Pt is a 71 y/o female admitted secondary to blood in stool and weakness. Pt found to have blood loss anemia and is s/p enteroscopy on 10/29. Pt also complained of bilat hip pain and R knee pain. 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 Per pt, needs bilat hip replacement and R knee replacement, however, cannot undergo until source of bleeding found. PMH includes DVT, HTN, DM, GI bleed, gout, OA, and anemia.   Clinical Impression  Pt admitted secondary to problem above with deficits below. PTA, pt reports she used rollator for ambulation, and required assist for ADLs from her niece. Upon eval, pt very limited by bilat hip pain and R knee pain. Also exhibited R and L hip popping which pt reports is baseline. Pain prevented pt from being able to attempt gait at EOB. Required mod A for mobility this session. Recommending SNF at d/c to increase independence and safety with functional mobility. Will continue to follow acutely to maximize functional mobility independence and safety.     Follow Up Recommendations SNF;Supervision/Assistance - 24 hour    Equipment Recommendations  Wheelchair (measurements PT);Wheelchair cushion (measurements PT)    Recommendations for Other Services       Precautions / Restrictions Precautions Precautions: Fall Restrictions Weight Bearing Restrictions: No      Mobility  Bed Mobility Overal bed mobility: Needs Assistance Bed Mobility: Supine to Sit;Sit to Supine     Supine to sit: Mod assist Sit to supine: Mod assist   General bed mobility comments: Mod A for LE assist for sitting and return to supine.   Transfers Overall transfer level: Needs assistance Equipment used: Rolling walker (2 wheeled) Transfers: Sit to/from Stand Sit to Stand: Mod  assist         General transfer comment: Mod A to stand. Unable to stand fully upright, as pt's hips and R knee causing pain. When attempting to stand upright, pt with R and L hip popping which caused increased pain. Pt reports this is baseline.   Ambulation/Gait             General Gait Details: Attempted to perform side steps, however, pts hip popping caused significant pain and pt unable to take steps.   Stairs            Wheelchair Mobility    Modified Rankin (Stroke Patients Only)       Balance Overall balance assessment: Needs assistance Sitting-balance support: No upper extremity supported;Feet supported Sitting balance-Leahy Scale: Fair     Standing balance support: Bilateral upper extremity supported;During functional activity Standing balance-Leahy Scale: Poor Standing balance comment: Reliant on UE support for standing.                              Pertinent Vitals/Pain Pain Assessment: 0-10 Pain Score: 9  Pain Location: bilat hips and R knee  Pain Descriptors / Indicators: Aching;Sore Pain Intervention(s): Limited activity within patient's tolerance;Monitored during session;Repositioned    Home Living Family/patient expects to be discharged to:: Private residence Living Arrangements: Other relatives (grandson) Available Help at Discharge: Family;Available PRN/intermittently Type of Home: Apartment Home Access: Level entry     Home Layout: One level Home Equipment: Grab bars - tub/shower;Bedside commode;Walker - 4 wheels;Walker - 2 wheels Additional Comments: Lives with teenage  grandson. [place]     Prior Function Level of Independence: Needs assistance   Gait / Transfers Assistance Needed: Uses rollator  ADL's / Homemaking Assistance Needed: Niece helps with bathing and dressing.         Hand Dominance   Dominant Hand: Left    Extremity/Trunk Assessment   Upper Extremity Assessment Upper Extremity  Assessment: Defer to OT evaluation    Lower Extremity Assessment Lower Extremity Assessment: RLE deficits/detail;LLE deficits/detail RLE Deficits / Details: R hip pain and R hip popping noted during mobility. Pt reports this is baseline. R knee pain also reported.  LLE Deficits / Details: L hip pain and popping noted with mobility.     Cervical / Trunk Assessment Cervical / Trunk Assessment: Kyphotic  Communication   Communication: No difficulties  Cognition Arousal/Alertness: Awake/alert Behavior During Therapy: WFL for tasks assessed/performed Overall Cognitive Status: Within Functional Limits for tasks assessed                                        General Comments General comments (skin integrity, edema, etc.): Pt reports she does not feel safe to go home at this time. Discussed SNF and pt agreeable.     Exercises     Assessment/Plan    PT Assessment Patient needs continued PT services  PT Problem List Decreased strength;Decreased range of motion;Decreased activity tolerance;Decreased balance;Decreased mobility;Decreased knowledge of use of DME;Pain       PT Treatment Interventions DME instruction;Gait training;Functional mobility training;Therapeutic activities;Therapeutic exercise;Balance training;Neuromuscular re-education;Patient/family education    PT Goals (Current goals can be found in the Care Plan section)  Acute Rehab PT Goals Patient Stated Goal: to go to SNF to learn to walk  PT Goal Formulation: With patient Time For Goal Achievement: 12/22/16 Potential to Achieve Goals: Good    Frequency Min 2X/week   Barriers to discharge        Co-evaluation               AM-PAC PT "6 Clicks" Daily Activity  Outcome Measure Difficulty turning over in bed (including adjusting bedclothes, sheets and blankets)?: A Lot Difficulty moving from lying on back to sitting on the side of the bed? : Unable Difficulty sitting down on and standing up  from a chair with arms (e.g., wheelchair, bedside commode, etc,.)?: Unable Help needed moving to and from a bed to chair (including a wheelchair)?: Total Help needed walking in hospital room?: Total Help needed climbing 3-5 steps with a railing? : Total 6 Click Score: 7    End of Session Equipment Utilized During Treatment: Gait belt Activity Tolerance: Patient limited by pain Patient left: in bed;with call bell/phone within reach;with bed alarm set Nurse Communication: Mobility status PT Visit Diagnosis: Muscle weakness (generalized) (M62.81);Pain Pain - Right/Left: Right (bilat) Pain - part of body: Hip;Knee (both hips; R knee )    Time: [id number] PT Time Calculation (min) (ACUTE ONLY): 27 min   Charges:   PT Evaluation $PT Eval Moderate Complexity: 1 Mod PT Treatments $Therapeutic Activity: 8-22 mins   PT G Codes:        Gladys DammeBrittany Bless Belshe, PT, DPT  Acute Rehabilitation Services  Pager: [phone]   Lehman PromBrittany S Jahiem Franzoni 12/08/2016, 5:32 PM

## 2016-12-08 NOTE — Progress Notes (Signed)
PROGRESS NOTE  Dana Nelson XBL:[id number] DOB: August 14, 1945 DOA: 12/04/2016 PCP: Patient, No Pcp Per  HPI/Recap of past 24 hours: Dana Nelson was seen and examined at her bedside. No acute events overnight reported. She reports tingling in right foot earlier this morning which has now resolved after morphine administration. Denies obvious sign of bleeding, dizziness, chest pain, or palpitations.   Assessment/Plan: Principal Problem:   Blood loss anemia Active Problems:   DVT (deep venous thrombosis) (HCC)   HTN (hypertension)   Hyperlipidemia   Hypothyroidism   GERD (gastroesophageal reflux disease)   Hypokalemia   Melena   GI bleed   AVM (arteriovenous malformation) of small bowel, acquired ([place])   Right knee pain   Right hip pain   Code Status: Full  Family Communication: No family members at bedside.  Disposition Plan: Will stay another midnight to continue current management.   Consultants:  None  Procedures:  None  Antimicrobials:  None  DVT prophylaxis:  xarelto for hx of DVT   Objective: Vitals:   12/08/16 0438 12/08/16 0511 12/08/16 0517 12/08/16 1340  BP: (!) 188/82 (!) 168/80 (!) 156/84 (!) 165/78  Pulse: 80 81  87  Resp: 18   20  Temp: 98.6 F (37 C)   99.5 F (37.5 C)  TempSrc: Oral   Oral  SpO2: 100%   98%  Weight:      Height:        Intake/Output Summary (Last 24 hours) at 12/08/16 1438 Last data filed at 12/08/16 1400  Gross per 24 hour  Intake           405.42 ml  Output              400 ml  Net             5.42 ml   Filed Weights   12/04/16 1946  Weight: 74.4 kg (164 lb 1.6 oz)    Exam:   General:  71 yo AAF well developped well nourished in NAD; A&O x3  Cardiovascular: RRR with no murmurs, gallops, or rubs  Respiratory: CTA with no rhonchi or wheezes  Abdomen: NT, ND with NBS x4 quadrants  Musculoskeletal: Moves all 4 extremities freely.   Skin: Hypopigmented skin in LE bilaterally  Psychiatry: Mood  appropriate for condition and circumstances   Data Reviewed: CBC:  Recent Labs Lab 12/04/16 1251 12/04/16 2210 12/05/16 0431 12/06/16 0908 12/06/16 1629 12/07/16 0510 12/07/16 2053 12/08/16 1007  WBC 12.0* 8.9 8.2 8.0  --  8.5  --  11.2*  NEUTROABS 8.6*  --   --  5.5  --  6.0  --  8.2*  HGB 4.3* 7.7* 8.1* 8.3* 8.4* 7.8* 9.9* 10.9*  HCT 14.6* 23.4* 25.5* 26.0* 26.3* 24.7* 31.7* 33.5*  MCV 73.7* 78.0 78.2 79.0  --  80.5  --  80.7  PLT 618* 392 400 437*  --  413*  --  PLATELET CLUMPS NOTED ON SMEAR, COUNT APPEARS ADEQUATE   Basic Metabolic Panel:  Recent Labs Lab 12/04/16 1251 12/05/16 0431 12/06/16 0908 12/07/16 0510 12/08/16 0541  NA 141 139 140 139 137  K 3.1* 4.0 3.8 3.6 4.1  CL 106 107 104 106 103  CO2 19* 24 27 25 25   GLUCOSE 137* 98 97 106* 102*  BUN 18 13 7 7 9   CREATININE 1.10* 0.73 0.73 0.72 0.75  CALCIUM 8.8* 8.7* 9.1 8.7* 8.9  MG  --   --  1.6* 1.5* 1.8  PHOS  --   --  3.3 3.5  --    GFR: Estimated Creatinine Clearance: 64.1 mL/min (by C-G formula based on SCr of 0.75 mg/dL). Liver Function Tests:  Recent Labs Lab 12/04/16 1251 12/06/16 0908 12/07/16 0510 12/08/16 0541  AST 20 15 14* 15  ALT 11* 10* 10* 11*  ALKPHOS 75 72 72 70  BILITOT 0.5 1.1 0.6 0.6  PROT 6.5 6.4* 6.0* 6.0*  ALBUMIN 2.9* 2.9* 2.6* 2.7*    Recent Labs Lab 12/04/16 1251  LIPASE 16   No results for input(s): AMMONIA in the last 168 hours. Coagulation Profile:  Recent Labs Lab 12/04/16 1836  INR 1.40   Cardiac Enzymes: No results for input(s): CKTOTAL, CKMB, CKMBINDEX, TROPONINI in the last 168 hours. BNP (last 3 results) No results for input(s): PROBNP in the last 8760 hours. HbA1C:  Recent Labs  12/07/16 0510  HGBA1C 5.5   CBG:  Recent Labs Lab 12/07/16 1944 12/08/16 0015 12/08/16 0434 12/08/16 0752 12/08/16 1206  GLUCAP 130* 114* 101* 104* 111*   Lipid Profile: No results for input(s): CHOL, HDL, LDLCALC, TRIG, CHOLHDL, LDLDIRECT in the last  72 hours. Thyroid Function Tests: No results for input(s): TSH, T4TOTAL, FREET4, T3FREE, THYROIDAB in the last 72 hours. Anemia Panel: No results for input(s): VITAMINB12, FOLATE, FERRITIN, TIBC, IRON, RETICCTPCT in the last 72 hours. Urine analysis:    Component Value Date/Time   COLORURINE YELLOW 08/17/2016 [place] 08/17/2016 1727   LABSPEC 1.011 08/17/2016 1727   PHURINE 5.0 08/17/2016 1727   GLUCOSEU NEGATIVE 08/17/2016 1727   HGBUR NEGATIVE 08/17/2016 1727   BILIRUBINUR NEGATIVE 08/17/2016 1727   KETONESUR NEGATIVE 08/17/2016 1727   PROTEINUR NEGATIVE 08/17/2016 1727   NITRITE NEGATIVE 08/17/2016 1727   LEUKOCYTESUR NEGATIVE 08/17/2016 1727   Sepsis Labs: @LABRCNTIP (procalcitonin:4,lacticidven:4)  )No results found for this or any previous visit (from the past 240 hour(s)).    Studies: No results found.  Scheduled Meds: . allopurinol  100 mg Oral Daily  . famotidine  40 mg Oral Daily  . ferrous sulfate  325 mg Oral Daily  . hydrALAZINE  15 mg Intravenous Once  . hydrALAZINE  25 mg Oral BID  . insulin aspart  0-9 Units Subcutaneous Q4H  . levothyroxine  112 mcg Oral QAC breakfast  . metoprolol succinate  50 mg Oral Daily  . pravastatin  20 mg Oral QHS    Continuous Infusions:   LOS: 4 days   Symptomatic Anemia likely related to chronic GI bleed in a patient on Xarelto and Iron Deficiency Anemia  -Hemoglobin 4.3 on admission. MCV 73.7.  -Patient has been compliant with her Xarelto.  -Oxygen saturation level greater than 90% on room air.  -Complete 3 units of packed red blood cells; Will be transfused another unit by Gastroenterology  -Hold Xarelto and Discontinue altogether given no DVT on recent scan -PT/INR was 17.1 and 1.40 -Serial CBC showed Patient's Hb/Hct went from 4.3/14.6 -> 7.7/23.4 -> 8.1/25.5 -> 8.3/26.0 -> 7.8/24.7 -Given IV Ferriheme by Gastroenterology -Restarted Home Ferrous Sulfate 325 mg po Daily -Continue to Monitor and  repeat CBC in AM  GI Bleed/Melena.  - Hg is stable - post 2U prbcs - continue to monitor and repeat CBC in the am  Left Leg DVT.  - 4 months ago - On xarelto  Hypokalemia.  - resolved - repeat BMP in asm  Hyperglycemia in the setting of well controlled type 2 diabetes  -HbA1c 5.5.; -Home medications include Metformin -Hold Metformin for now -C/w Sensitive Novolog Sliding  Scale Insulin q4h for optimal control  Hypertension.  -Pressure on the high side of normal. BP was 163/71 -She reports noncompliance with medications. -Home meds include Lasix, Hydrochlorothiazide, Hydralazine, Toprol. Patient was tachycardic on presentation which is improved. -C/w Metoprolol Succinate 50 mg po Daily  -Resume Hydralazine and continue to Hold Hydrochlorothiazide and Lasix -Continue to Monitor Blood Pressure -Resume other home medications as indicated  Hypothyroidism.  -Home medications include Synthroid.  -Patient reports noncompliance with this medication -Obtained a TSH and was 0.359 -Resume Levothyroxine 112 mcg po Daily  GERD.  -Protonix gtt discontinued by Gastroenterology   Hypomagnesemia. -Patient's Mag Level was 1.5 -Replete with IV Mag Sulfate 2 grams -Continue to Monitor and Replete as Necessary  R Hip Pain -Obtain Hip X-Rays -Severe narrowing of both hip joints is noted consistent with severe degenerative joint disease. Sclerosis and lucency is seen involving both femoral heads suggesting avascular necrosis. No acute fracture or dislocation is noted -Pain Control with Tramadol and Morphine -Difficult Situation as need to find source of Bleeding prior to Hip and Knee Replacements as no Orthopedic will operate prior to GI Bleed Issue being resolved  -Obtain PT/OT Evaluation and Treat  R Knee Pain -Obtain Knee X-Rays -Showed Moderate to severe degenerative joint disease. No acute abnormality -Pain Control with Tramadol and Morphine -Obtain PT/OT Evaluation and  Treat   Kayleen Memos, MD [facility] Pager [phone]  If 7PM-7AM, please contact night-coverage [email or web address] Password [email or web address] 12/08/2016, 2:38 PM

## 2016-12-08 NOTE — Progress Notes (Signed)
Daily Rounding Note  12/08/2016, 9:31 AM  LOS: 4 days   SUBJECTIVE:   Chief complaint: has not had BM since arrival and concerned that she could still be bleeding.    Wants to meet with Social Worker before she leaves hospital.    OBJECTIVE:         Vital signs in last 24 hours:    Temp:  [98.3 F (36.8 C)-99.4 F (37.4 C)] 98.6 F (37 C) (10/31 0438) Pulse Rate:  [71-81] 81 (10/31 0511) Resp:  [14-20] 18 (10/31 0438) BP: (135-188)/(67-96) 156/84 (10/31 0517) SpO2:  [94 %-100 %] 100 % (10/31 0438) Last BM Date: 12/05/16 Filed Weights   12/04/16 1946  Weight: 74.4 kg (164 lb 1.6 oz)   General: alert, NAD though    Heart: RRR Chest: clear bil.  No labored breathing.  Abdomen: soft, NT.  ND active BS  Extremities: no CCE Neuro/Psych:  Oriented x 3.  Pressured speach  Intake/Output from previous day: 10/30 0701 - 10/31 0700 In: 405.4 [I.V.:90.4; Blood:315] Out: 500 [Urine:500]  Intake/Output this shift: No intake/output data recorded.  Lab Results:  Recent Labs  12/06/16 0908 12/06/16 1629 12/07/16 0510 12/07/16 2053  WBC 8.0  --  8.5  --   HGB 8.3* 8.4* 7.8* 9.9*  HCT 26.0* 26.3* 24.7* 31.7*  PLT 437*  --  413*  --    BMET  Recent Labs  12/06/16 0908 12/07/16 0510 12/08/16 0541  NA 140 139 137  K 3.8 3.6 4.1  CL 104 106 103  CO2 27 25 25   GLUCOSE 97 106* 102*  BUN 7 7 9   CREATININE 0.73 0.72 0.75  CALCIUM 9.1 8.7* 8.9   LFT  Recent Labs  12/06/16 0908 12/07/16 0510 12/08/16 0541  PROT 6.4* 6.0* 6.0*  ALBUMIN 2.9* 2.6* 2.7*  AST 15 14* 15  ALT 10* 10* 11*  ALKPHOS 72 72 70  BILITOT 1.1 0.6 0.6   PT/INR No results for input(s): LABPROT, INR in the last 72 hours. Hepatitis Panel No results for input(s): HEPBSAG, HCVAB, HEPAIGM, HEPBIGM in the last 72 hours.  Studies/Results: Dg Knee Complete 4 Views Right  Result Date: 12/07/2016 CLINICAL DATA:  Chronic right knee pain.  EXAM: RIGHT KNEE - COMPLETE 4+ VIEW COMPARISON:  None. FINDINGS: No evidence of fracture, dislocation, or joint effusion. Severe narrowing of patellofemoral space is noted with osteophyte formation. Moderate narrowing of the medial and lateral joint spaces is noted with osteophyte formation. Soft tissues are unremarkable. IMPRESSION: Moderate to severe degenerative joint disease. No acute abnormality seen in the right knee. Electronically Signed   By: Lupita Raider, M.D.   On: 12/07/2016 14:43   Dg Hip Unilat With Pelvis 2-3 Views Right  Result Date: 12/07/2016 CLINICAL DATA:  Chronic right hip pain. EXAM: DG HIP (WITH OR WITHOUT PELVIS) 2-3V RIGHT COMPARISON:  None. FINDINGS: Severe narrowing of both hip joints is noted consistent with severe degenerative joint disease. Sclerosis and lucency is seen involving both femoral heads suggesting avascular necrosis. 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 No acute abnormality is noted. Electronically Signed   By: Lupita Raider, M.D.   On: 12/07/2016 14:42   Scheduled Meds: . allopurinol  100 mg Oral Daily  . famotidine  40 mg Oral Daily  . ferrous sulfate  325 mg Oral Daily  .  hydrALAZINE  15 mg Intravenous Once  . hydrALAZINE  25 mg Oral BID  . insulin aspart  0-9 Units Subcutaneous Q4H  . levothyroxine  112 mcg Oral QAC breakfast  . metoprolol succinate  50 mg Oral Daily  . pravastatin  20 mg Oral QHS   Continuous Infusions: PRN Meds:.acetaminophen **OR** acetaminophen, morphine injection, ondansetron **OR** ondansetron (ZOFRAN) IV, traMADol, traZODone  ASSESMENT:     Melena, hematochezia.  AVM reported on outside CE 09/2016.  EGD ~ 06/2016 Dr Sundra [place]inala in [place]: Gastritis.   Colon ~ 06/2016: removed colon polyp (type not defined) GCE 8/23 with severe antral gastritis, duodenitis (along with non-bleeding  jejunal AVMs 10/29 Enteroscopy: Normal into jejunum.  Reported bleeding jejunal lesion not encountered/reached. Previous endoscopic workup in summer 2017 and spring/summer 2018 in [place] (colon, EGD, capsule endoscopy).   Bleeding has ceased with discontinuation of Xarelto.   Will [facility] spirus enter[facility], ? Timing. ? invs outpt?   *  Chronic OAC, Xarelto, for hx DVT 06/2016.  On hold since admission.  Decision as to whether this can be discontinued versus will need to be continued has yet to be made.  No evidence of lower extremity DVT on 10/29 Doppler studies.  *  Acute on chronic anemia from blood loss, issue dating to Summer 2017 (pre xarelto).  On po Iron 1 x daily PTA and now.  S/p Feraheme 10/28.   S/p PRBC x 3.  Hgb 4.3 >. 8.4 >> 7.8 >> 9.9.  S/p PRBC x 4, 1 of these yesterday.    *  Deg joint dz of bil  Knees and hips and left rot cuff injury needing replacement.  Limits her ambulation Has no local ortho MD yet.  No ortho will surgerize her until issue of GI bleed put to rest.    *  Depression.  Multifactorial.  Obese dtr in NH after septic shock, pt's personal health challenges.     PLAN   *  Discharge home.    Dr. Leone PayorGessner will be following the patient up as an outpatient, patient will be contacted with the appointment details.  Have faxed request for actual EGD and colonoscopy reports from Dr.Ayinala's office in [place].    *  [facility] on [address].    *  Famotidine 40 mg daily,  Iron Sulfate 325 mg daily.    *  Has upcoming appt in early November to establish care with a PMD at [facility].      Jennye MoccasinSarah Gribbin  12/08/2016, 9:31 AM Pager: [phone]  I have discussed the case with the PA, and that is the plan I formulated.  I have discussed this with Dr. Leone PayorGessner to coordinate plan. We will try to get the actual EGD/colonoscopy reports from [place] in order to make a more confident decision regarding double balloon  enteroscopy.   Charlie PitterHenry L Danis III Pager [phone]  Mon-Fri 8a-5p [phone] after 5p, weekends, holidays

## 2016-12-09 DIAGNOSIS — D649 Anemia, unspecified: Secondary | ICD-10-CM

## 2016-12-09 DIAGNOSIS — R0602 Shortness of breath: Secondary | ICD-10-CM

## 2016-12-09 DIAGNOSIS — R52 Pain, unspecified: Secondary | ICD-10-CM

## 2016-12-09 LAB — CBC
HCT: 30.7 % — ABNORMAL LOW (ref 36.0–46.0)
HEMOGLOBIN: 9.7 g/dL — AB (ref 12.0–15.0)
MCH: 26.1 pg (ref 26.0–34.0)
MCHC: 31.6 g/dL (ref 30.0–36.0)
MCV: 82.7 fL (ref 78.0–100.0)
Platelets: 415 10*3/uL — ABNORMAL HIGH (ref 150–400)
RBC: 3.71 MIL/uL — AB (ref 3.87–5.11)
RDW: 18.4 % — ABNORMAL HIGH (ref 11.5–15.5)
WBC: 10.1 10*3/uL (ref 4.0–10.5)

## 2016-12-09 LAB — COMPREHENSIVE METABOLIC PANEL
ALBUMIN: 2.7 g/dL — AB (ref 3.5–5.0)
ALK PHOS: 73 U/L (ref 38–126)
ALT: 11 U/L — AB (ref 14–54)
AST: 16 U/L (ref 15–41)
Anion gap: 10 (ref 5–15)
BUN: 7 mg/dL (ref 6–20)
CALCIUM: 9.3 mg/dL (ref 8.9–10.3)
CO2: 26 mmol/L (ref 22–32)
CREATININE: 0.64 mg/dL (ref 0.44–1.00)
Chloride: 102 mmol/L (ref 101–111)
GFR calc Af Amer: >60 mL/min (ref >60)
GFR calc non Af Amer: >60 mL/min (ref >60)
GLUCOSE: 100 mg/dL — AB (ref 65–99)
Potassium: 3.7 mmol/L (ref 3.5–5.1)
SODIUM: 138 mmol/L (ref 135–145)
Total Bilirubin: 1.2 mg/dL (ref 0.3–1.2)
Total Protein: 6.5 g/dL (ref 6.5–8.1)

## 2016-12-09 LAB — GLUCOSE, CAPILLARY
GLUCOSE-CAPILLARY: 124 mg/dL — AB (ref 65–99)
GLUCOSE-CAPILLARY: 143 mg/dL — AB (ref 65–99)
Glucose-Capillary: 120 mg/dL — ABNORMAL HIGH (ref 65–99)
Glucose-Capillary: 106 mg/dL — ABNORMAL HIGH (ref 65–99)
Glucose-Capillary: 128 mg/dL — ABNORMAL HIGH (ref 65–99)

## 2016-12-09 MED ORDER — RIVAROXABAN 20 MG PO TABS
20.0000 mg | ORAL_TABLET | Freq: Every day | ORAL | Status: DC
  Filled 2016-12-09: qty 1

## 2016-12-09 MED ORDER — RIVAROXABAN 20 MG PO TABS
20.0000 mg | ORAL_TABLET | Freq: Every day | ORAL | Status: DC
  Administered 2016-12-09: 20 mg via ORAL
  Filled 2016-12-09: qty 1

## 2016-12-09 MED ORDER — SENNOSIDES-DOCUSATE SODIUM 8.6-50 MG PO TABS
1.0000 | ORAL_TABLET | Freq: Two times a day (BID) | ORAL | Status: DC
  Administered 2016-12-09 – 2016-12-10 (×3): 1 via ORAL
  Filled 2016-12-09 (×3): qty 1

## 2016-12-09 NOTE — Evaluation (Signed)
Occupational Therapy Evaluation Patient Details Name: Dana Nelson MRN: [id number] DOB: 1945-06-22 Today's Date: 12/09/2016    History of Present Illness Pt is a 71 y/o female admitted secondary to blood in stool and weakness. Pt found to have blood loss anemia and is s/p enteroscopy on 10/29. Pt also complained of bilat hip pain and R knee pain. 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 Per pt, needs bilat hip replacement and R knee replacement, however, cannot undergo until source of bleeding found. PMH includes DVT, HTN, DM, GI bleed, gout, OA, and anemia.    Clinical Impression   Pt admitted with the above diagnosis and has the deficits listed below. Pt would benefit from cont OT to increase independence with basic transfers to and from [facility] and shower bench and independence with adls so she can return to her apartment primarily from a w/c level until she is ready for joint replacements. Pt is accustomed to being very independent and is extremely motivated but has not had an easy time with things the past few years with her family.  Pt can walk but feel for adls it is not a functional walk at this time due to pain, popping etc in her joints.  Feel SNF rehab may be necessary to get her to the level of independence she needs until she can get surgery.      Follow Up Recommendations  SNF;Supervision/Assistance - 24 hour    Equipment Recommendations  Tub/shower bench    Recommendations for Other Services       Precautions / Restrictions Precautions Precautions: Fall Precaution Comments: joint pain in L shoulder, B knees and hips. Restrictions Weight Bearing Restrictions: No Other Position/Activity Restrictions: Pt has difficulty standing up straight due to hip pain.      Mobility Bed Mobility Overal bed mobility: Needs Assistance Bed Mobility: Supine to Sit;Sit to Supine     Supine to sit: Mod assist Sit to supine: Mod  assist   General bed mobility comments: Mod A for LE assist for sitting and return to supine.   Transfers Overall transfer level: Needs assistance Equipment used: Rolling walker (2 wheeled) Transfers: Sit to/from [employer] Sit to Stand: Min assist Stand pivot transfers: Mod assist       General transfer comment: Pt unable to stand fully upright but was very motivated to get to the chair without too much physical assist.    Balance Overall balance assessment: Needs assistance Sitting-balance support: No upper extremity supported;Feet supported Sitting balance-Leahy Scale: Good     Standing balance support: Bilateral upper extremity supported;During functional activity Standing balance-Leahy Scale: Poor Standing balance comment: Reliant on UE support for standing.                            ADL either performed or assessed with clinical judgement   ADL Overall ADL's : Needs assistance/impaired Eating/Feeding: Set up;Sitting   Grooming: Minimal assistance;Sitting Grooming Details (indicate cue type and reason): limited due to painful L shoulder. Upper Body Bathing: Minimal assistance;Sitting Upper Body Bathing Details (indicate cue type and reason): limited due to pain in L shoulder Lower Body Bathing: Moderate assistance;Sit to/from stand;Cueing for compensatory techniques Lower Body Bathing Details (indicate cue type and reason): Pt limited by pain. Pt transferred sit to stand with min assist but would benefit from adaptive equipment to assist with pain relief when dressing. Upper Body Dressing : Moderate assistance;Sitting  Lower Body Dressing: Maximal assistance;Sit to/from stand Lower Body Dressing Details (indicate cue type and reason): Pt would benefit from LE adaptive equipment to make her more independent dressing. Toilet Transfer: Minimal assistance;BSC;Stand-pivot StatisticianToilet Transfer Details (indicate cue type and reason): very little  physical assist needed but extensive popping in all joints. Toileting- Clothing Manipulation and Hygiene: Moderate assistance;Sit to/from stand Toileting - Clothing Manipulation Details (indicate cue type and reason): pt able to clean self but everything is very painful.     Functional mobility during ADLs: Moderate assistance;Rolling walker General ADL Comments: Pts mobilty during adls is really not functional. Pt walked appx 20 feet in room for adls but it is painful and not productive.  Feel another option for mobilty needs to be explored until her joint pain can be addressed.      Vision Baseline Vision/History: No visual deficits Patient Visual Report: No change from baseline Vision Assessment?: No apparent visual deficits     Perception Perception Perception Tested?: No   Praxis Praxis Praxis tested?: Within functional limits    Pertinent Vitals/Pain Pain Assessment: 0-10 Pain Score: 8  Pain Location: bilat hips and R knee  Pain Descriptors / Indicators: Aching;Sore Pain Intervention(s): Limited activity within patient's tolerance;Monitored during session;Repositioned;Relaxation     Hand Dominance Left   Extremity/Trunk Assessment Upper Extremity Assessment Upper Extremity Assessment: LUE deficits/detail LUE Deficits / Details: Shoulder ROM 20 degrees flexion, 20 degrees abduction. Possible rotator cuff injury from caring for daughter.   LUE: Unable to fully assess due to pain LUE Coordination: decreased gross motor   Lower Extremity Assessment Lower Extremity Assessment: Defer to PT evaluation   Cervical / Trunk Assessment Cervical / Trunk Assessment: Kyphotic   Communication Communication Communication: No difficulties   Cognition Arousal/Alertness: Awake/alert Behavior During Therapy: WFL for tasks assessed/performed Overall Cognitive Status: Within Functional Limits for tasks assessed                                     General Comments  Pt  most limited by pain in joints, kyphosis.    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  Her daughter [place] in [place]os[place] ([place] term[place] so grandson lives with her.  There is family in [place] helping to care for grandson who just started school [place] in [place].        Prior Functioning/Environment Level of Independence: Needs assistance  Gait / Transfers Assistance Needed: Uses rollator ADL's / Homemaking Assistance Needed: Niece has helped with bathing and dressing just last 2 weeks. Pt fairly independent before that with adls. Pt uses rollator but may need to find better mode of mobility at this point with her extensive arthritis. Very motivated to be as independent as possible.   Comments: Pt became very emotional speaking of how she is not able to caregive for her daughter and grandson how she would like because her body  is "falling apart."  This pt is accustomed to being the caregiver and being independent and struggles with the role of being the patient.  Very motivated.        OT Problem List: Decreased strength;Decreased range of motion;Decreased activity tolerance;Impaired balance (sitting and/or standing);Decreased coordination;Decreased knowledge of use of DME or AE;Impaired UE functional use;Pain      OT Treatment/Interventions: Self-care/ADL training;DME and/or AE instruction;Therapeutic activities    OT Goals(Current goals can be found  in the care plan section) Acute Rehab OT Goals Patient Stated Goal: to go to SNF to learn to walk  OT Goal Formulation: With patient Time For Goal Achievement: 12/23/16 Potential to Achieve Goals: Good ADL Goals Pt Will Perform Lower Body Bathing: with adaptive equipment;with supervision;sit to/from stand Pt Will Perform Upper Body Dressing: sitting;with supervision (with loose pull over clothing) Pt Will Perform Lower Body Dressing: with supervision;sit to/from stand;with adaptive equipment Pt Will Perform Tub/Shower Transfer: Tub transfer;with min assist;Squat pivot transfer;rolling walker;tub bench;grab bars Additional ADL Goal #1: Pt will pivot to BSC from w/c and toilet with supervision including clothing management.  OT Frequency: Min 2X/week   Barriers to D/C: Decreased caregiver support  Pt lives with grandson who is in high school       Co-evaluation              AM-PAC PT "6 Clicks" Daily Activity     Outcome Measure Help from another person eating meals?: A Little Help from another person taking care of personal grooming?: A Little Help from another person toileting, which includes using toliet, bedpan, or urinal?: A Little Help from another person bathing (including washing, rinsing, drying)?: A Lot Help from another person to put on and taking off regular upper body clothing?: A Lot Help from another person to put on and taking off regular lower body clothing?: A Lot 6 Click Score: 15   End of Session Equipment Utilized During Treatment: Rolling walker Nurse Communication: Mobility status  Activity Tolerance: Patient limited by pain Patient left: in chair;with call bell/phone within reach  OT Visit Diagnosis: Unsteadiness on feet (R26.81);Muscle weakness (generalized) (M62.81);Pain Pain - Right/Left: Left Pain - part of body: Hip;Knee;Shoulder                Time: 4098-1191 OT Time Calculation (min): 32 min Charges:  OT General Charges $OT Visit: 1  Visit OT Evaluation $OT Eval Moderate Complexity: 1 Mod OT Treatments $Self Care/Home Management : 8-22 mins G-Codes:     Tory Emerald, OTR/l  Hope Budds 12/09/2016, 10:34 AM

## 2016-12-09 NOTE — NC FL2 (Signed)
[place] MEDICAID FL2 LEVEL OF CARE SCREENING TOOL     IDENTIFICATION  Patient Name: Dana Nelson Birthdate: 12/06/1945 Sex: female Admission Date (Current Location): 12/04/2016  [facility] [place]   Facility and Address:  [facility], [address], [place], [place] [address]      Provider Number: [id number]  Attending Physician Name and Address:  Darlin DropHall, Carole N, DO  Relative Name and Phone Number:  Selena BattenKim, sister, [phone]    Current Level of Care: Hospital Recommended Level of Care: Skilled Nursing Facility Prior Approval Number:    Date Approved/Denied:   PASRR Number: [id number]  Discharge Plan: SNF    Current Diagnoses: Patient Active Problem List   Diagnosis Date Noted  . Right knee pain 12/07/2016  . Right hip pain 12/07/2016  . AVM (arteriovenous malformation) of small bowel, acquired (HCC)   . Hypokalemia 12/04/2016  . Melena 12/04/2016  . GI bleed 12/04/2016  . Hematochezia   . Acute blood loss anemia   . Chronic iron deficiency anemia   . Angioedema 08/17/2016  . Blood loss anemia 08/17/2016  . DVT (deep venous thrombosis) (HCC) 08/17/2016  . HTN (hypertension) 08/17/2016  . Hyperlipidemia 08/17/2016  . Microcytic anemia 08/17/2016  . Hypothyroidism 08/17/2016  . GERD (gastroesophageal reflux disease) 08/17/2016    Orientation RESPIRATION BLADDER Height & Weight     Self, Time, Place, Situation  Normal Continent Weight: 74.4 kg (164 lb 1.6 oz) Height:  5\' 3"  (160 cm)  BEHAVIORAL SYMPTOMS/MOOD NEUROLOGICAL BOWEL NUTRITION STATUS      Continent Diet (Please see DC Summary)  AMBULATORY STATUS COMMUNICATION OF NEEDS Skin   Limited Assist Verbally Normal                       Personal Care Assistance Level of Assistance  Bathing, Feeding, Dressing Bathing Assistance: Limited assistance Feeding assistance: Independent Dressing Assistance: Limited assistance     Functional Limitations  Info             SPECIAL CARE FACTORS FREQUENCY  PT (By licensed PT), OT (By licensed OT)     PT Frequency: 5x/week OT Frequency: 3x/week            Contractures      Additional Factors Info  Code Status, Allergies, Insulin Sliding Scale Code Status Info: Full Allergies Info: Eggs Or Egg-derived Products, Losartan, Other, Peanut-containing Drug Products, Penicillins   Insulin Sliding Scale Info: Every four hours       Current Medications (12/09/2016):  This is the current hospital active medication list Current Facility-Administered Medications  Medication Dose Route Frequency Provider Last Rate Last Dose  . acetaminophen (TYLENOL) tablet 650 mg  650 mg Oral Q6H PRN Gwenyth BenderBlack, Karen M, NP       Or  . acetaminophen (TYLENOL) suppository 650 mg  650 mg Rectal Q6H PRN Gwenyth BenderBlack, Karen M, NP      . allopurinol (ZYLOPRIM) tablet 100 mg  100 mg Oral Daily Gwenyth BenderBlack, Karen M, NP   100 mg at 12/09/16 1108  . famotidine (PEPCID) tablet 40 mg  40 mg Oral Daily Dianah FieldGribbin, Sarah J, PA-C   40 mg at 12/09/16 1109  . ferrous sulfate tablet 325 mg  325 mg Oral Daily Marguerita MerlesSheikh, Omair [place], [place]   325 mg at 12/09/16 1109  . hydrALAZINE (APRESOLINE) injection 10 mg  10 mg Intravenous Q6H PRN Dow AdolphHall, Carole N, DO      . hydrALAZINE (APRESOLINE) injection 15  mg  15 mg Intravenous Once Bodenheimer, Charles A, NP      . hydrALAZINE (APRESOLINE) tablet 25 mg  25 mg Oral BID Marguerita Merles [place], DO   25 mg at 12/09/16 1109  . insulin aspart (novoLOG) injection 0-9 Units  0-9 Units Subcutaneous Q4H Gwenyth Bender, NP   1 Units at 12/09/16 1327  . levothyroxine (SYNTHROID, LEVOTHROID) tablet 112 mcg  112 mcg Oral QAC breakfast Marguerita Merles [place], [place]   112 mcg at 12/09/16 1110  . metoprolol succinate (TOPROL-XL) 24 hr tablet 50 mg  50 mg Oral Daily Gwenyth Bender, NP   50 mg at 12/09/16 1110  . morphine 2 MG/ML injection 2 mg  2 mg Intravenous Q4H PRN Marguerita Merles [place], DO   2 mg at 12/08/16 4098  . ondansetron  (ZOFRAN) tablet 4 mg  4 mg Oral Q6H PRN Gwenyth Bender, NP       Or  . ondansetron [facility]) injection 4 mg  4 mg Intravenous Q6H PRN Gwenyth Bender, NP      . pravastatin (PRAVACHOL) tablet 20 mg  20 mg Oral QHS Gwenyth Bender, NP   20 mg at 12/08/16 2353  . rivaroxaban (XARELTO) tablet 20 mg  20 mg Oral Q supper Hammons, Kimberly B, RPH   20 mg at 12/09/16 1622  . senna-docusate (Senokot-S) tablet 1 tablet  1 tablet Oral BID Dow Adolph N, DO   1 tablet at 12/09/16 1327  . traMADol (ULTRAM) tablet 50 mg  50 mg Oral Q6H PRN Sheikh, Omair Latif, DO   50 mg at 12/07/16 1027  . traZODone (DESYREL) tablet 25 mg  25 mg Oral QHS PRN Black, Lesle Chris, NP         Discharge Medications: Please see discharge summary for a list of discharge medications.  Relevant Imaging Results:  Relevant Lab Results:   Additional Information SSN: [id number] [place], [place]

## 2016-12-09 NOTE — Progress Notes (Signed)
PROGRESS NOTE  Dana Nelson KHV:[id number] DOB: 1945/10/25 DOA: 12/04/2016 PCP: Patient, No Pcp Per  HPI/Recap of past 24 hours: No acute events overnight.  Patient admits to generalized weakness. No chest pain, dyspnea or palpitations.  Assessment/Plan: Principal Problem:   Blood loss anemia Active Problems:   DVT (deep venous thrombosis) (HCC)   HTN (hypertension)   Hyperlipidemia   Hypothyroidism   GERD (gastroesophageal reflux disease)   Hypokalemia   Melena   GI bleed   AVM (arteriovenous malformation) of small bowel, acquired ([place])   Right knee pain   Right hip pain   Code Status: Full  Family Communication: No family members at bedside  Disposition Plan: Will stay another midnight to continue current management   Consultants:  GI  Procedures:  None  Antimicrobials:  None  DVT prophylaxis:  Xarelto for hx of left lower extremity DVT   Objective: Vitals:   12/08/16 2124 12/09/16 0606 12/09/16 1108 12/09/16 1439  BP: (!) 149/68 (!) 158/75 (!) 143/71 119/63  Pulse: 85 84 87 84  Resp: 18 18  16   Temp: 99.1 F (37.3 C) 99.1 F (37.3 C)  97.9 F (36.6 C)  TempSrc: Oral Oral  Oral  SpO2: 98% 97%  99%  Weight:      Height:        Intake/Output Summary (Last 24 hours) at 12/09/16 1907 Last data filed at 12/09/16 1800  Gross per 24 hour  Intake                0 ml  Output             1300 ml  Net            -1300 ml   Filed Weights   12/04/16 1946  Weight: 74.4 kg (164 lb 1.6 oz)    Exam:   General:  71 yo AAF well developped well nourished in NAD; A&O x3  Cardiovascular: RRR with no murmurs, gallops, or rubs  Respiratory: CTA with no rhonchi or wheezes  Abdomen: NT, ND with NBS x4 quadrants  Musculoskeletal: Moves all 4 extremities with equally reduced strength.   Skin: Hypopigmented skin bilaterally  Psychiatry: Mood appropriate for condition and circumstances  Data Reviewed: CBC:  Recent Labs Lab 12/04/16 1251   12/05/16 0431 12/06/16 0908 12/06/16 1629 12/07/16 0510 12/07/16 2053 12/08/16 1007 12/09/16 0654  WBC 12.0*  < > 8.2 8.0  --  8.5  --  11.2* 10.1  NEUTROABS 8.6*  --   --  5.5  --  6.0  --  8.2*  --   HGB 4.3*  < > 8.1* 8.3* 8.4* 7.8* 9.9* 10.9* 9.7*  HCT 14.6*  < > 25.5* 26.0* 26.3* 24.7* 31.7* 33.5* 30.7*  MCV 73.7*  < > 78.2 79.0  --  80.5  --  80.7 82.7  PLT 618*  < > 400 437*  --  413*  --  PLATELET CLUMPS NOTED ON SMEAR, COUNT APPEARS ADEQUATE 415*  < > = values in this interval not displayed. Basic Metabolic Panel:  Recent Labs Lab 12/05/16 0431 12/06/16 0908 12/07/16 0510 12/08/16 0541 12/09/16 0654  NA 139 140 139 137 138  K 4.0 3.8 3.6 4.1 3.7  CL 107 104 106 103 102  CO2 24 27 25 25 26   GLUCOSE 98 97 106* 102* 100*  BUN 13 7 7 9 7   CREATININE 0.73 0.73 0.72 0.75 0.64  CALCIUM 8.7* 9.1 8.7* 8.9 9.3  MG  --  1.6* 1.5* 1.8  --   PHOS  --  3.3 3.5  --   --    GFR: Estimated Creatinine Clearance: 64.1 mL/min (by C-G formula based on SCr of 0.64 mg/dL). Liver Function Tests:  Recent Labs Lab 12/04/16 1251 12/06/16 0908 12/07/16 0510 12/08/16 0541 12/09/16 0654  AST 20 15 14* 15 16  ALT 11* 10* 10* 11* 11*  ALKPHOS 75 72 72 70 73  BILITOT 0.5 1.1 0.6 0.6 1.2  PROT 6.5 6.4* 6.0* 6.0* 6.5  ALBUMIN 2.9* 2.9* 2.6* 2.7* 2.7*    Recent Labs Lab 12/04/16 1251  LIPASE 16   No results for input(s): AMMONIA in the last 168 hours. Coagulation Profile:  Recent Labs Lab 12/04/16 1836  INR 1.40   Cardiac Enzymes: No results for input(s): CKTOTAL, CKMB, CKMBINDEX, TROPONINI in the last 168 hours. BNP (last 3 results) No results for input(s): PROBNP in the last 8760 hours. HbA1C:  Recent Labs  12/07/16 0510  HGBA1C 5.5   CBG:  Recent Labs Lab 12/08/16 2356 12/09/16 0425 12/09/16 0809 12/09/16 1214 12/09/16 1603  GLUCAP 105* 120* 106* 124* 143*   Lipid Profile: No results for input(s): CHOL, HDL, LDLCALC, TRIG, CHOLHDL, LDLDIRECT in the  last 72 hours. Thyroid Function Tests: No results for input(s): TSH, T4TOTAL, FREET4, T3FREE, THYROIDAB in the last 72 hours. Anemia Panel: No results for input(s): VITAMINB12, FOLATE, FERRITIN, TIBC, IRON, RETICCTPCT in the last 72 hours. Urine analysis:    Component Value Date/Time   COLORURINE YELLOW 08/17/2016 [place] 08/17/2016 1727   LABSPEC 1.011 08/17/2016 1727   PHURINE 5.0 08/17/2016 1727   GLUCOSEU NEGATIVE 08/17/2016 1727   HGBUR NEGATIVE 08/17/2016 1727   BILIRUBINUR NEGATIVE 08/17/2016 1727   KETONESUR NEGATIVE 08/17/2016 1727   PROTEINUR NEGATIVE 08/17/2016 1727   NITRITE NEGATIVE 08/17/2016 1727   LEUKOCYTESUR NEGATIVE 08/17/2016 1727   Sepsis Labs: @LABRCNTIP (procalcitonin:4,lacticidven:4)  )No results found for this or any previous visit (from the past 240 hour(s)).    Studies: No results found.  Scheduled Meds: . allopurinol  100 mg Oral Daily  . famotidine  40 mg Oral Daily  . ferrous sulfate  325 mg Oral Daily  . hydrALAZINE  15 mg Intravenous Once  . hydrALAZINE  25 mg Oral BID  . insulin aspart  0-9 Units Subcutaneous Q4H  . levothyroxine  112 mcg Oral QAC breakfast  . metoprolol succinate  50 mg Oral Daily  . pravastatin  20 mg Oral QHS  . rivaroxaban  20 mg Oral Q supper  . senna-docusate  1 tablet Oral BID    Continuous Infusions:   LOS: 5 days   Symptomatic Anemia likely 2/2 to chronic GI bleed in the setting of  Anticoagulation and Iron Deficiency Anemia  -Hemoglobin 4.3 on admission. MCV 73.7.  -Patient has been compliant with her Xarelto.  -Oxygen saturation level greater than 90% on room air.  -Complete 3 units of packed red blood cells; Will be transfused another unit by Gastroenterology  -Hold Xarelto and Discontinue altogether given no DVT on recent scan -PT/INR was 17.1 and 1.40 -Serial CBC showed Patient's Hb/Hct went from 4.3/14.6 ->7.7/23.4 ->8.1/25.5 ->8.3/26.0 -> 7.8/24.7 -Given IV Ferriheme by  Gastroenterology -Restarted Home Ferrous Sulfate 325 mg po Daily -Continue to Monitor and repeat CBC in AM  GI Bleed/Melena. - Hg is stable - post 2U prbcs - continue to monitor and repeat CBC in the am  Left Leg DVT.  - 4 months ago - On xarelto  Hypokalemia.  - resolved - repeat BMP in asm  Hyperglycemia in the setting of well controlled type 2 diabetes  -HbA1c 5.5.; -Home medications include Metformin -Hold Metformin for now -C/w Sensitive Novolog Sliding Scale Insulin q4h for optimal control  Hypertension.  -Pressure on the high side of normal. BP was 163/71 -She reports noncompliance with medications. -Home meds include Lasix, Hydrochlorothiazide, Hydralazine, Toprol. Patient was tachycardic on presentation which is improved. -C/w Metoprolol Succinate 50 mg po Daily  -Resume Hydralazine and continue to Hold Hydrochlorothiazide and Lasix -Continue to Monitor Blood Pressure -Resume other home medications as indicated  Hypothyroidism.  -Home medications include Synthroid.  -Patient reports noncompliance with this medication -Obtained a TSH and was 0.359 -Resume Levothyroxine 112 mcg po Daily  GERD.  -Protonix gtt discontinued by Gastroenterology   Hypomagnesemia. -Patient's Mag Level was 1.5 -Replete with IV Mag Sulfate 2 grams -Continue to Monitor and Replete as Necessary  R Hip Pain -Obtain Hip X-Rays -Severe narrowing of both hip joints is noted consistent with severe degenerative joint disease. Sclerosis and lucency is seen involving both femoral heads suggesting avascular necrosis. No acute fracture or dislocation is noted -Pain Control with Tramadol and Morphine -Difficult Situation as need to find source of Bleeding prior to Hip and Knee Replacements as no Orthopedic will operate prior to GI Bleed Issue being resolved  -Obtain PT/OT Evaluation and Treat  R Knee Pain -Obtain Knee X-Rays -Showed Moderate to severe degenerative joint disease.  No acute abnormality -Pain Control with Tramadol and Morphine -Obtain PT/OT Evaluation and Treat  Kayleen Memos, MD [facility] Pager [phone]  If 7PM-7AM, please contact night-coverage [email or web address] Password [email or web address] 12/09/2016, 7:07 PM

## 2016-12-09 NOTE — Progress Notes (Signed)
Physical Therapy Treatment Patient Details Name: Dana Nelson MRN: [id number] DOB: 1945-09-15 Today's Date: 12/09/2016    History of Present Illness Pt is a 71 y/o female admitted secondary to blood in stool and weakness. Pt found to have blood loss anemia and is s/p enteroscopy on 10/29. Pt also complained of bilat hip pain and R knee pain. 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 Per pt, needs bilat hip replacement and R knee replacement, however, cannot undergo until source of bleeding found. PMH includes DVT, HTN, DM, GI bleed, gout, OA, and anemia.     PT Comments    Pt with limited progress towards her goals in today's session. Pt's functional mobility is severely limited by decreased ROM in bilateral hips and bilateral knees (R worse than L). Pt currently requires modAx2 for stand step transfer to and from [facility]. Pt is very motivated to work to improve mobility, however has difficulty following steps for safety with transfers and ambulation. Pt requires skilled PT to progress mobility and improve strength and endurance to safely navigate their discharge environment.      Follow Up Recommendations  SNF;Supervision/Assistance - 24 hour     Equipment Recommendations  Wheelchair (measurements PT);Wheelchair cushion (measurements PT)    Recommendations for Other Services       Precautions / Restrictions Precautions Precautions: Fall Precaution Comments: joint pain in L shoulder, B knees and hips. Restrictions Weight Bearing Restrictions: No Other Position/Activity Restrictions: Pt has difficulty standing up straight due to hip pain.    Mobility  Bed Mobility                  Transfers Overall transfer level: Needs assistance Equipment used: 2 person hand held assist Transfers: Stand Pivot Transfers Sit to Stand: Mod assist;+2 physical assistance Stand pivot transfers: Mod assist;+2 physical assistance        General transfer comment: Pt unable to stand upright and maintains nearly 90 degree flexion of hips with standing, pt also unable to fully extend R knee, proper technique for stand pivot transfer from recliner to [facility], keeping posterior towards BSC, instead on coming to upright pt turned to face [facility] and had to turn all the way around before sitting down despite maximal verbal cuing for correct technique  Ambulation/Gait             General Gait Details: pt only able to take 4 short side steps to center in front of recliner to sit before pain in knees too great      Balance Overall balance assessment: Needs assistance Sitting-balance support: No upper extremity supported;Feet supported Sitting balance-Leahy Scale: Good     Standing balance support: Bilateral upper extremity supported;During functional activity Standing balance-Leahy Scale: Poor Standing balance comment: Reliant on UE support for standing.                             Cognition Arousal/Alertness: Awake/alert Behavior During Therapy: WFL for tasks assessed/performed Overall Cognitive Status: Impaired/Different from baseline Area of Impairment: Safety/judgement;Following commands                       Following Commands: Follows multi-step commands inconsistently Safety/Judgement: Decreased awareness of safety     General Comments: pt with decreased awarness of safety with movement and difficulty following commands for performing safe mobility         General Comments General comments (  skin integrity, edema, etc.): VSS thoughout session      Pertinent Vitals/Pain Pain Assessment: 0-10 Pain Score: 7  Pain Location: bilat hips and R knee  Pain Descriptors / Indicators: Aching;Sore Pain Intervention(s): Limited activity within patient's tolerance;Monitored during session           PT Goals (current goals can now be found in the care plan section) Acute Rehab PT Goals Patient Stated  Goal: to go to SNF to learn to walk  PT Goal Formulation: With patient Time For Goal Achievement: 12/22/16 Potential to Achieve Goals: Good Progress towards PT goals: Progressing toward goals    Frequency    Min 2X/week      PT Plan Current plan remains appropriate       AM-PAC PT "6 Clicks" Daily Activity  Outcome Measure  Difficulty turning over in bed (including adjusting bedclothes, sheets and blankets)?: A Lot Difficulty moving from lying on back to sitting on the side of the bed? : Unable Difficulty sitting down on and standing up from a chair with arms (e.g., wheelchair, bedside commode, etc,.)?: Unable Help needed moving to and from a bed to chair (including a wheelchair)?: Total Help needed walking in hospital room?: Total Help needed climbing 3-5 steps with a railing? : Total 6 Click Score: 7    End of Session Equipment Utilized During Treatment: Gait belt Activity Tolerance: Patient limited by pain Patient left: with call bell/phone within reach;in chair Nurse Communication: Mobility status PT Visit Diagnosis: Muscle weakness (generalized) (M62.81);Pain Pain - Right/Left: Right (bilat) Pain - part of body: Hip;Knee (both hips; R knee )     Time: [id number] PT Time Calculation (min) (ACUTE ONLY): 22 min  Charges:  $Therapeutic Activity: 8-22 mins                    G Codes:       Stephaine Breshears B. Beverely RisenVan Fleet PT, DPT Acute Rehabilitation  [phone] Pager [phone]  Elon Alaslizabeth B Van Fleet 12/09/2016, 5:37 PM

## 2016-12-10 DIAGNOSIS — E785 Hyperlipidemia, unspecified: Secondary | ICD-10-CM

## 2016-12-10 DIAGNOSIS — M25551 Pain in right hip: Secondary | ICD-10-CM

## 2016-12-10 LAB — BASIC METABOLIC PANEL
ANION GAP: 8 (ref 5–15)
BUN: 14 mg/dL (ref 6–20)
CHLORIDE: 105 mmol/L (ref 101–111)
CO2: 26 mmol/L (ref 22–32)
Calcium: 9.1 mg/dL (ref 8.9–10.3)
Creatinine, Ser: 0.7 mg/dL (ref 0.44–1.00)
GFR calc Af Amer: >60 mL/min (ref >60)
GLUCOSE: 104 mg/dL — AB (ref 65–99)
POTASSIUM: 3.7 mmol/L (ref 3.5–5.1)
Sodium: 139 mmol/L (ref 135–145)

## 2016-12-10 LAB — GLUCOSE, CAPILLARY
GLUCOSE-CAPILLARY: 100 mg/dL — AB (ref 65–99)
GLUCOSE-CAPILLARY: 101 mg/dL — AB (ref 65–99)
GLUCOSE-CAPILLARY: 99 mg/dL (ref 65–99)
Glucose-Capillary: 103 mg/dL — ABNORMAL HIGH (ref 65–99)
Glucose-Capillary: 94 mg/dL (ref 65–99)

## 2016-12-10 LAB — CBC WITH DIFFERENTIAL/PLATELET
BASOS ABS: 0 10*3/uL (ref 0.0–0.1)
Basophils Relative: 0 %
Eosinophils Absolute: 0.2 10*3/uL (ref 0.0–0.7)
Eosinophils Relative: 2 %
HEMATOCRIT: 28.8 % — AB (ref 36.0–46.0)
HEMOGLOBIN: 9 g/dL — AB (ref 12.0–15.0)
LYMPHS ABS: 2 10*3/uL (ref 0.7–4.0)
LYMPHS PCT: 19 %
MCH: 26 pg (ref 26.0–34.0)
MCHC: 31.3 g/dL (ref 30.0–36.0)
MCV: 83.2 fL (ref 78.0–100.0)
Monocytes Absolute: 0.9 10*3/uL (ref 0.1–1.0)
Monocytes Relative: 9 %
NEUTROS ABS: 7.3 10*3/uL (ref 1.7–7.7)
NEUTROS PCT: 70 %
PLATELETS: 400 10*3/uL (ref 150–400)
RBC: 3.46 MIL/uL — AB (ref 3.87–5.11)
RDW: 18.9 % — ABNORMAL HIGH (ref 11.5–15.5)
WBC: 10.4 10*3/uL (ref 4.0–10.5)

## 2016-12-10 MED ORDER — FERROUS SULFATE 325 (65 FE) MG PO TABS: 325.0000 mg | ORAL_TABLET | Freq: Every day | ORAL | 0 refills | Status: AC

## 2016-12-10 MED ORDER — DICLOFENAC SODIUM 1 % TD GEL: 2.0000 g | Freq: Four times a day (QID) | TRANSDERMAL | 0 refills | Status: DC

## 2016-12-10 MED ORDER — FAMOTIDINE 40 MG PO TABS: 40.0000 mg | ORAL_TABLET | Freq: Every day | ORAL | 0 refills | Status: AC

## 2016-12-10 MED ORDER — DICLOFENAC SODIUM 1 % TD GEL
2.0000 g | Freq: Four times a day (QID) | TRANSDERMAL | Status: DC
  Administered 2016-12-10: 2 g via TOPICAL
  Filled 2016-12-10: qty 100

## 2016-12-10 NOTE — Progress Notes (Signed)
Dana Nelson to be D/C'd to [facility] per MD order. Report called to Lauren at [facility]. Allergies as of 12/10/2016      Reactions   Eggs Or Egg-derived Products Other (See Comments)   [facility] - unknown reaction   Losartan Swelling   Swelling of lips and face   Other Other (See Comments)   Hives and facial swelling from all nuts (tree nuts and peanuts)   Peanut-containing Drug Products Hives, Swelling   Facial swelling   Penicillins Other (See Comments)   Childhood allergy- convulstions Has patient had a PCN reaction causing immediate rash, facial/tongue/throat swelling, SOB or lightheadedness with hypotension: Yes Has patient had a PCN reaction causing severe rash involving mucus membranes or skin necrosis: No Has patient had a PCN reaction that required hospitalization: Yes Has patient had a PCN reaction occurring within the last 10 years: No If all of the above answers are "NO", then may proceed with Cephalosporin use.      Medication List    STOP taking these medications   losartan-hydrochlorothiazide 100-25 MG tablet Commonly known as:  HYZAAR     TAKE these medications   allopurinol 100 MG tablet Commonly known as:  ZYLOPRIM Take 100 mg by mouth daily as needed (gout).   aspirin EC 81 MG tablet Take 81 mg by mouth daily.   diclofenac sodium 1 % Gel Commonly known as:  VOLTAREN Apply 2 g topically 4 (four) times daily.   famotidine 40 MG tablet Commonly known as:  PEPCID Take 1 tablet (40 mg total) by mouth daily.   ferrous sulfate 325 (65 FE) MG tablet Take 1 tablet (325 mg total) by mouth daily.   hydrALAZINE 25 MG tablet Commonly known as:  APRESOLINE Take 25 mg by mouth 2 (two) times daily.   levothyroxine 112 MCG tablet Commonly known as:  SYNTHROID, LEVOTHROID Take 112 mcg by mouth daily before breakfast.   metFORMIN 750 MG 24 hr tablet Commonly known as:  GLUCOPHAGE-XR Take 750 mg by mouth 2 (two) times daily.    metoprolol succinate 50 MG 24 hr tablet Commonly known as:  TOPROL-XL Take 50 mg by mouth every evening. Take with or immediately following a meal.   pantoprazole 40 MG tablet Commonly known as:  PROTONIX Take 40 mg by mouth daily.   polyethylene glycol packet Commonly known as:  MIRALAX / GLYCOLAX Take 17 g by mouth daily as needed for mild constipation.   pravastatin 20 MG tablet Commonly known as:  PRAVACHOL Take 20 mg by mouth at bedtime.   rivaroxaban 20 MG Tabs tablet Commonly known as:  XARELTO Take 20 mg by mouth daily.   sucralfate 1 g tablet Commonly known as:  CARAFATE Take 1 g by mouth 2 (two) times daily.       VVS, Skin clean, dry and intact without evidence of skin break down, no evidence of skin tears noted.  IV catheter discontinued intact. Site without signs and symptoms of complications. Dressing and pressure applied.  An After Visit Summary was printed and given to transport.  Patient escorted via stretcher , and D/C to [facility].  Dana Nelson  12/10/2016 4:48 PM

## 2016-12-10 NOTE — Clinical Social Work Note (Signed)
Clinical Social Work Assessment  Patient Details  Name: Dana EpleyRegina Nelson MRN: [id number] Date of Birth: 11/13/1945  Date of referral:  12/09/16               Reason for consult:  Facility Placement                Permission sought to share information with:  Facility Medical sales representativeContact Representative, Family Supports Permission granted to share information::  Yes, Verbal Permission Granted  Name::     Dana and development officerKim  Agency::  SNFs  Relationship::  Sister  Contact Information:  [phone]  Housing/Transportation Living arrangements for the past 2 months:  Apartment Source of Information:  Patient Patient Interpreter Needed:  None Criminal Activity/Legal Involvement Pertinent to Current Situation/Hospitalization:  No - Comment as needed Significant Relationships:  Siblings, Other Family Members (69 year old grandson) Lives with:  Other (Comment) (Grandson) Do you feel safe going back to the place where you live?  No Need for family participation in patient care:  No (Coment)  Care giving concerns:  CSW received consult for possible SNF placement at time of discharge. CSW spoke with patient regarding PT recommendation of SNF placement at time of discharge. Patient reported that there is no one to help her with her current physical needs and fall risk. Patient expressed understanding of PT recommendation and is agreeable to SNF placement at time of discharge. CSW to continue to follow and assist with discharge planning needs.   Social Worker assessment / plan:  CSW spoke with patient concerning [facility] before [facility].  Employment status:  Retired Health and safety inspectornsurance information:  Medicare PT Recommendations:  Skilled Nursing Facility Information / Referral to community resources:  Skilled Nursing Facility  Patient/Family's Response to care:  Patient recognizes need for rehab before returning home and is agreeable [place]. Patient reported preference for [facility].    Patient/Family's Understanding of and Emotional Response to Diagnosis, Current Treatment, and Prognosis:  Patient/family is realistic regarding therapy needs and expressed being hopeful for SNF placement. Patient expressed understanding of CSW role and discharge process as well as medical condition. No questions/concerns about plan or treatment.    Emotional Assessment Appearance:  Appears stated age Attitude/Demeanor/Rapport:  Other (Appropriate) Affect (typically observed):  Accepting, Appropriate Orientation:  Oriented to Self, Oriented to Situation, Oriented to Place, Oriented to  Time Alcohol / Substance use:  Not Applicable Psych involvement (Current and /or in the community):  No (Comment)  Discharge Needs  Concerns to be addressed:  Care Coordination Readmission within the last 30 days:  No Current discharge risk:  None Barriers to Discharge:  Continued Medical Work up   [employer] S Remie Mathison, LCSWA 12/10/2016, 1:06 PM

## 2016-12-10 NOTE — Care Management Note (Signed)
Case Management Note  Patient Details  Name: Dana EpleyRegina Nelson MRN: [id number] Date of Birth: April 18, 1945  Subjective/Objective:        Admitted with blood loss anemia, hx of DVT (4 months ago)/Xarelto, hypertension, hyperlipidemia, hypothyroidism, chronic GI bleed with iron deficiency anemia.   Pedro EarlsKim Modica (Sister)     [phone]      PCP:  Action/Plan: CM received consult: Pt request home health at d/c.Plan is to d/c to SNF. CSW managing disposition to facility.  Expected Discharge Date:  12/10/16               Expected Discharge Plan:  Skilled Nursing Facility  In-House Referral:  Clinical Social Work  Discharge planning Services  CM Consult   Status of Service:  Completed, signed off  If discussed at [employer] Length of Stay Meetings, dates discussed:    Additional Comments:  Epifanio LeschesCole, Krystelle Prashad Hudson, RN 12/10/2016, 2:09 PM

## 2016-12-10 NOTE — Clinical Social Work Placement (Signed)
   CLINICAL SOCIAL WORK PLACEMENT  NOTE  Date:  12/10/2016  Patient Details  Name: Dana Nelson MRN: [id number] Date of Birth: 10-28-1945  Clinical Social Work is seeking post-discharge placement for this patient at the Skilled  Nursing Facility level of care (*CSW will initial, date and re-position this form in  chart as items are completed):  Yes   Patient/family provided with [facility] Clinical Social Work Department's list of facilities offering this level of care within the geographic area requested by the patient (or if unable, by the patient's family).  Yes   Patient/family informed of their freedom to choose among providers that offer the needed level of care, that participate in Medicare, Medicaid or managed care program needed by the patient, have an available bed and are willing to accept the patient.  Yes   Patient/family informed of [facility]'s ownership interest in [facility] and [facility], as well as of the fact that they are under no obligation to receive care at these facilities.  PASRR submitted to EDS on 12/09/16     PASRR number received on 12/09/16     Existing PASRR number confirmed on       FL2 transmitted to all facilities in geographic area requested by pt/family on 12/09/16     FL2 transmitted to all facilities within larger geographic area on       Patient informed that his/her managed care company has contracts with or will negotiate with certain facilities, including the following:        Yes   Patient/family informed of bed offers received.  Patient chooses bed at [facility]     Physician recommends and patient chooses bed at      Patient to be transferred to [facility] on 12/10/16.  Patient to be transferred to facility by PTAR     Patient family notified on 12/10/16 of transfer.  Name of family member notified:  Sister-in-law     PHYSICIAN Please sign FL2     Additional Comment:     _______________________________________________ Mearl LatinNadia S Doral Digangi, LCSWA 12/10/2016, 1:05 PM

## 2016-12-10 NOTE — Discharge Summary (Signed)
Discharge Summary  Dana Nelson WJX:[id number] DOB: [birth year]  PCP: Patient, No Pcp Per  Admit date: 12/04/2016 Discharge date: 12/10/2016  Time spent: 25 minutes  Recommendations for Outpatient Follow-up:  1. Repeat CBC within 48 hours 2. Follow up with GI within a week 3. Follow up with PCP within a week  Discharge Diagnoses:  Active Hospital Problems   Diagnosis Date Noted  . Blood loss anemia 08/17/2016  . Right knee pain 12/07/2016  . Right hip pain 12/07/2016  . AVM (arteriovenous malformation) of small bowel, acquired (HCC)   . Hypokalemia 12/04/2016  . Melena 12/04/2016  . GI bleed 12/04/2016  . DVT (deep venous thrombosis) (HCC) 08/17/2016  . GERD (gastroesophageal reflux disease) 08/17/2016  . HTN (hypertension) 08/17/2016  . Hyperlipidemia 08/17/2016  . Hypothyroidism 08/17/2016    Resolved Hospital Problems   Diagnosis Date Noted Date Resolved  No resolved problems to display.    Discharge Condition: Stable  Diet recommendation: Resume previous diet  Vitals:   12/10/16 1027 12/10/16 1139  BP: (!) 144/72 132/60  Pulse:  89  Resp:  16  Temp:  98.7 F (37.1 C)  SpO2: 99% 98%    History of present illness:  Dana Nelson is a very pleasant 71 y.o. female with medical history significant for DVT (4 months ago) recently on Xarelto, hypertension, hyperlipidemia, hypothyroidism, chronic GI bleed with iron deficiency anemia presents to [facility] ED with chief complaint of melena. Initial evaluation reveals hemoglobin of 4.3. GI consulted small bowel endoscopy 12/06/16 did not show active bleeding. Patient was transfused units of PRBCs. Hemoglobin has been stable at around 9. Patient restarted on xarelto 12/09/16 for hx of DVT. Will defer to PCP to discontinue anticoagulation if appropriate.  Per GI, Dr Leone Payor will be following the patient and have faxed request for EGD and colonoscopy reports from GI physician in [place]. Has upcoming appointment in  early November to establish care with PMD at [facility]. Might need a double balloon enteroscopy, defer to GI.   Hospital Course:  Principal Problem:   Blood loss anemia Active Problems:   DVT (deep venous thrombosis) (HCC)   HTN (hypertension)   Hyperlipidemia   Hypothyroidism   GERD (gastroesophageal reflux disease)   Hypokalemia   Melena   GI bleed   AVM (arteriovenous malformation) of small bowel, acquired (HCC)   Right knee pain   Right hip pain  Symptomatic Anemia likely 2/2 to chronic GI bleed in the setting of anticoagulation and Iron Deficiency Anemia  -Hemoglobin 4.3 on admission. MCV 73.7.  -Patient has been compliant with her Xarelto.  -Oxygen saturation level greater than 90% on room air.  -Completed 4 units of packed red blood cells;  -No DVT on recent scan Left duplex ultrasound; defer to PCP to discontinue xarelto -Restarted Home Ferrous Sulfate 325 mg po Daily -Continue to Monitor and repeat CBC in AM  GI Bleed/Melena. - Hg is stable; no recurrent melena - post 4U prbcs - Repeat CBC in 48 hours  Left Leg DVT.  - Reported by patient to be diagnosed 4 months ago - On xarelto  Hypokalemia.  - resolved  Hyperglycemia in the setting of well controlled type 2 diabetes -HbA1c 5.5.; -Home medications include Metformin -Resume Metformin   Hypertension.  -Resume home meds  Hypothyroidism.  -Obtained a TSH and was 0.359 -Resume Levothyroxine 112 mcg po Daily  GERD.  -Famotidine per Gastroenterology   Hypomagnesemia. -Repleted  R Hip Pain -Obtain Hip X-Rays -Severe narrowing of both hip  joints is noted consistent with severe degenerative joint disease. Sclerosis and lucency is seen involving both femoral heads suggesting avascular necrosis. No acute fracture or dislocation is noted -Continue Physical therapy -Tylenol for mild to moderate pain -Voltaren gel for severe joint pain  R Knee Pain -Knee X-Rays showed Moderate to severe  degenerative joint disease.  -Pain control as described above -PT as tolerated  Procedures:  Small bowel endoscopy  Consultations:  GI  Discharge Exam: BP 132/60 (BP Location: Left Arm)   Pulse 89   Temp 98.7 F (37.1 C) (Oral)   Resp 16   Ht 5\' 3"  (1.6 m)   Wt 74.4 kg (164 lb 1.6 oz)   SpO2 98%   BMI 29.07 kg/m   General: 71 yo AAF well developed well nourished, sitting up in bed in no acute distress. Breathing comfortably on room air. Cardiovascular: RRR with no gallops or rubs. Respiratory: CTA with no wheezes or rhonchi.  Discharge Instructions You were cared for by a hospitalist during your hospital stay. If you have any questions about your discharge medications or the care you received while you were in the hospital after you are discharged, you can call the unit and asked to speak with the hospitalist on call if the hospitalist that took care of you is not available. Once you are discharged, your primary care physician will handle any further medical issues. Please note that NO REFILLS for any discharge medications will be authorized once you are discharged, as it is imperative that you return to your primary care physician (or establish a relationship with a primary care physician if you do not have one) for your aftercare needs so that they can reassess your need for medications and monitor your lab values.   Allergies as of 12/10/2016      Reactions   Eggs Or Egg-derived Products Other (See Comments)   Reported by [facility] 11/24/11 - unknown reaction   Losartan Swelling   Swelling of lips and face   Other Other (See Comments)   Hives and facial swelling from all nuts (tree nuts and peanuts)   Peanut-containing Drug Products Hives, Swelling   Facial swelling   Penicillins Other (See Comments)   Childhood allergy- convulstions Has patient had a PCN reaction causing immediate rash, facial/tongue/throat swelling, SOB or lightheadedness with hypotension: Yes Has  patient had a PCN reaction causing severe rash involving mucus membranes or skin necrosis: No Has patient had a PCN reaction that required hospitalization: Yes Has patient had a PCN reaction occurring within the last 10 years: No If all of the above answers are "NO", then may proceed with Cephalosporin use.      Medication List    STOP taking these medications   losartan-hydrochlorothiazide 100-25 MG tablet Commonly known as:  HYZAAR     TAKE these medications   allopurinol 100 MG tablet Commonly known as:  ZYLOPRIM Take 100 mg by mouth daily as needed (gout).   aspirin EC 81 MG tablet Take 81 mg by mouth daily.   diclofenac sodium 1 % Gel Commonly known as:  VOLTAREN Apply 2 g topically 4 (four) times daily.   famotidine 40 MG tablet Commonly known as:  PEPCID Take 1 tablet (40 mg total) by mouth daily.   ferrous sulfate 325 (65 FE) MG tablet Take 1 tablet (325 mg total) by mouth daily.   hydrALAZINE 25 MG tablet Commonly known as:  APRESOLINE Take 25 mg by mouth 2 (two) times daily.  levothyroxine 112 MCG tablet Commonly known as:  SYNTHROID, LEVOTHROID Take 112 mcg by mouth daily before breakfast.   metFORMIN 750 MG 24 hr tablet Commonly known as:  GLUCOPHAGE-XR Take 750 mg by mouth 2 (two) times daily.   metoprolol succinate 50 MG 24 hr tablet Commonly known as:  TOPROL-XL Take 50 mg by mouth every evening. Take with or immediately following a meal.   pantoprazole 40 MG tablet Commonly known as:  PROTONIX Take 40 mg by mouth daily.   polyethylene glycol packet Commonly known as:  MIRALAX / GLYCOLAX Take 17 g by mouth daily as needed for mild constipation.   pravastatin 20 MG tablet Commonly known as:  PRAVACHOL Take 20 mg by mouth at bedtime.   rivaroxaban 20 MG Tabs tablet Commonly known as:  XARELTO Take 20 mg by mouth daily.   sucralfate 1 g tablet Commonly known as:  CARAFATE Take 1 g by mouth 2 (two) times daily.      Allergies    Allergen Reactions  . Eggs Or Egg-Derived Products Other (See Comments)    Reported by [facility] 11/24/11 - unknown reaction  . Losartan Swelling    Swelling of lips and face  . Other Other (See Comments)    Hives and facial swelling from all nuts (tree nuts and peanuts)  . Peanut-Containing Drug Products Hives and Swelling    Facial swelling  . Penicillins Other (See Comments)    Childhood allergy- convulstions Has patient had a PCN reaction causing immediate rash, facial/tongue/throat swelling, SOB or lightheadedness with hypotension: Yes Has patient had a PCN reaction causing severe rash involving mucus membranes or skin necrosis: No Has patient had a PCN reaction that required hospitalization: Yes Has patient had a PCN reaction occurring within the last 10 years: No If all of the above answers are "NO", then may proceed with Cephalosporin use.      The results of significant diagnostics from this hospitalization (including imaging, microbiology, ancillary and laboratory) are listed below for reference.    Significant Diagnostic Studies: Portable Chest 1 View  Result Date: 12/04/2016 CLINICAL DATA:  Dyspnea which shortness-of-breath, lightheadedness and weakness today. EXAM: PORTABLE CHEST 1 VIEW COMPARISON:  None. FINDINGS: Lungs are adequately inflated without focal consolidation or effusion. Cardiomediastinal silhouette is within normal. There is calcified plaque over the thoracic aorta. There are degenerative changes of the spine and shoulders. IMPRESSION: No acute cardiopulmonary disease. Electronically Signed   By: Elberta Fortis M.D.   On: 12/04/2016 17:14   Dg Knee Complete 4 Views Right  Result Date: 12/07/2016 CLINICAL DATA:  Chronic right knee pain. EXAM: RIGHT KNEE - COMPLETE 4+ VIEW COMPARISON:  None. FINDINGS: No evidence of fracture, dislocation, or joint effusion. Severe narrowing of patellofemoral space is noted with osteophyte formation. Moderate narrowing of  the medial and lateral joint spaces is noted with osteophyte formation. Soft tissues are unremarkable. IMPRESSION: Moderate to severe degenerative joint disease. No acute abnormality seen in the right knee. Electronically Signed   By: Lupita Raider, M.D.   On: 12/07/2016 14:43   Dg Hip Unilat With Pelvis 2-3 Views Right  Result Date: 12/07/2016 CLINICAL DATA:  Chronic right hip pain. EXAM: DG HIP (WITH OR WITHOUT PELVIS) 2-3V RIGHT COMPARISON:  None. FINDINGS: Severe narrowing of both hip joints is noted consistent with severe degenerative joint disease. Sclerosis and lucency is seen involving both femoral heads suggesting avascular necrosis. 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 No acute abnormality is noted. Electronically Signed   By: Lupita Raider, M.D.   On: 12/07/2016 14:42    Microbiology: No results found for this or any previous visit (from the past 240 hour(s)).   Labs: Basic Metabolic Panel:  Recent Labs Lab 12/06/16 0908 12/07/16 0510 12/08/16 0541 12/09/16 0654 12/10/16 0400  NA 140 139 137 138 139  K 3.8 3.6 4.1 3.7 3.7  CL 104 106 103 102 105  CO2 27 25 25 26 26   GLUCOSE 97 106* 102* 100* 104*  BUN 7 7 9 7 14   CREATININE 0.73 0.72 0.75 0.64 0.70  CALCIUM 9.1 8.7* 8.9 9.3 9.1  MG 1.6* 1.5* 1.8  --   --   PHOS 3.3 3.5  --   --   --    Liver Function Tests:  Recent Labs Lab 12/04/16 1251 12/06/16 0908 12/07/16 0510 12/08/16 0541 12/09/16 0654  AST 20 15 14* 15 16  ALT 11* 10* 10* 11* 11*  ALKPHOS 75 72 72 70 73  BILITOT 0.5 1.1 0.6 0.6 1.2  PROT 6.5 6.4* 6.0* 6.0* 6.5  ALBUMIN 2.9* 2.9* 2.6* 2.7* 2.7*    Recent Labs Lab 12/04/16 1251  LIPASE 16   No results for input(s): AMMONIA in the last 168 hours. CBC:  Recent Labs Lab 12/04/16 1251  12/06/16 0908  12/07/16 0510 12/07/16 2053 12/08/16 1007 12/09/16 0654  12/10/16 0400  WBC 12.0*  < > 8.0  --  8.5  --  11.2* 10.1 10.4  NEUTROABS 8.6*  --  5.5  --  6.0  --  8.2*  --  7.3  HGB 4.3*  < > 8.3*  < > 7.8* 9.9* 10.9* 9.7* 9.0*  HCT 14.6*  < > 26.0*  < > 24.7* 31.7* 33.5* 30.7* 28.8*  MCV 73.7*  < > 79.0  --  80.5  --  80.7 82.7 83.2  PLT 618*  < > 437*  --  413*  --  PLATELET CLUMPS NOTED ON SMEAR, COUNT APPEARS ADEQUATE 415* 400  < > = values in this interval not displayed. Cardiac Enzymes: No results for input(s): CKTOTAL, CKMB, CKMBINDEX, TROPONINI in the last 168 hours. BNP: BNP (last 3 results) No results for input(s): BNP in the last 8760 hours.  ProBNP (last 3 results) No results for input(s): PROBNP in the last 8760 hours.  CBG:  Recent Labs Lab 12/09/16 2056 12/10/16 0024 12/10/16 0512 12/10/16 0746 12/10/16 1214  GLUCAP 128* 100* 103* 94 99       Signed:  Darlin Drop, MD [facility] 12/10/2016, 12:49 PM

## 2016-12-10 NOTE — Progress Notes (Signed)
Patient will DC to: [facility] Anticipated DC date: 12/10/16 Family notified: Patient notifying family Transport by: Sharin MonsPTAR   Per MD patient ready for [facility]. RN, patient, patient's family, and facility notified of DC. Discharge Summary sent to facility. RN given number for report [phone]). DC packet on chart. Ambulance transport requested for patient.   CSW signing off.  Cristobal GoldmannNadia Silena Wyss, [place] Clinical Social Worker [phone]

## 2016-12-13 ENCOUNTER — Non-Acute Institutional Stay (SKILLED_NURSING_FACILITY): Payer: Medicare Other | Admitting: Internal Medicine

## 2016-12-13 DIAGNOSIS — E119 Type 2 diabetes mellitus without complications: Secondary | ICD-10-CM

## 2016-12-13 DIAGNOSIS — D62 Acute posthemorrhagic anemia: Secondary | ICD-10-CM | POA: Diagnosis not present

## 2016-12-13 DIAGNOSIS — E034 Atrophy of thyroid (acquired): Secondary | ICD-10-CM | POA: Diagnosis not present

## 2016-12-13 DIAGNOSIS — M150 Primary generalized (osteo)arthritis: Secondary | ICD-10-CM | POA: Diagnosis not present

## 2016-12-13 DIAGNOSIS — K921 Melena: Secondary | ICD-10-CM | POA: Diagnosis not present

## 2016-12-13 DIAGNOSIS — K922 Gastrointestinal hemorrhage, unspecified: Secondary | ICD-10-CM | POA: Diagnosis not present

## 2016-12-13 DIAGNOSIS — K558 Other vascular disorders of intestine: Secondary | ICD-10-CM | POA: Diagnosis not present

## 2016-12-13 DIAGNOSIS — G8929 Other chronic pain: Secondary | ICD-10-CM

## 2016-12-13 DIAGNOSIS — I82402 Acute embolism and thrombosis of unspecified deep veins of left lower extremity: Secondary | ICD-10-CM | POA: Diagnosis not present

## 2016-12-13 DIAGNOSIS — I10 Essential (primary) hypertension: Secondary | ICD-10-CM | POA: Diagnosis not present

## 2016-12-13 DIAGNOSIS — D509 Iron deficiency anemia, unspecified: Secondary | ICD-10-CM | POA: Diagnosis not present

## 2016-12-13 DIAGNOSIS — M25551 Pain in right hip: Secondary | ICD-10-CM | POA: Diagnosis not present

## 2016-12-13 DIAGNOSIS — K5520 Angiodysplasia of colon without hemorrhage: Secondary | ICD-10-CM

## 2016-12-13 DIAGNOSIS — K219 Gastro-esophageal reflux disease without esophagitis: Secondary | ICD-10-CM

## 2016-12-13 DIAGNOSIS — M25561 Pain in right knee: Secondary | ICD-10-CM

## 2016-12-13 DIAGNOSIS — M159 Polyosteoarthritis, unspecified: Secondary | ICD-10-CM

## 2016-12-13 LAB — CBC AND DIFFERENTIAL
HEMATOCRIT: 29 — AB (ref 36–46)
Hemoglobin: 9 — AB (ref 12.0–16.0)
PLATELETS: 428 — AB (ref 150–399)
WBC: 9.1

## 2016-12-13 LAB — BASIC METABOLIC PANEL
BUN: 13 (ref 4–21)
CREATININE: 0.6 (ref 0.5–1.1)
GLUCOSE: 93
Potassium: 4.2 (ref 3.4–5.3)
Sodium: 139 (ref 137–147)

## 2016-12-14 ENCOUNTER — Encounter: Payer: Self-pay | Admitting: Internal Medicine

## 2016-12-14 NOTE — Progress Notes (Signed)
: Provider:  Randon Goldsmith. Lyn Hollingshead, MD Location:   [facility] Room Number: [phone] Place of Service:  SNF (31)  PCP: Patient, No Pcp Per Patient Care Team: Patient, No Pcp Per as PCP - General (General Practice)  Extended Emergency Contact Information Primary Emergency Contact: Lorenza, Shakir [place] Home Phone: [phone] Relation: Sister     Allergies: Eggs or egg-derived products; Losartan; Other; Peanut-containing drug products; and Penicillins  Chief Complaint  Patient presents with  . New Admit To SNF    following hospitalization 12/04/16 to 12/10/16 melena, blood loss anemia.    HPI: Patient is 71 y.o. female ith recent DVT on some relative, hypertension, hyperlipidemia, hypothyroidism, and chronic GI bleed with iron deficiency anemia presented to [facility] ED with complaint of melena.Initial evaluation revealed a hemoglobin of 4.3Patient reported general She also reported some noncompliance with her home medications except for her Zarrella toe. She denied headache visual disturbances syncope or near syncope, chest pain palpitations abdominal pains dysuria hematuria frequency or urgency. She also denied any lower extremity edema. She was initially just relocated from [place] where she had a capsule study that indicated a lower part of her stomach was inflamed. She reported she had an EGD this year that was unremarkable as well as colonoscopy that was remarkable for polyps and diverticulosis. She was admitted in7/2018 for angioedema with cidental finding at that time her hemoglobin of 6.3.patient was admitted to [facility] from 10/27-11/2 with lower GI bleed with severe anemia. Patient was transfused with 3 units of packed RBCs. Hemoglobin has remained stable around 9 since that time. GI performed a small bowel endoscopy on 12/06/16 which did not show an active bleeding. There was much conversation around whether patient is a relative  should be stopped or not. Patient obtained her DVT from immobility from osteoarthritis and patient continues to be not mobile;therefore the decision was made to continue xarelto which was restarted on 12/09/16. Patient is admitted to skills nursing facility with generalized weaknessfor OT/PT.While at skilled nursing facility patient will be followed for diabetes treated with metformin, hypertension treated with hydralazine, metoprolol and hypothyroidism treated with Synthroid.  Past Medical History:  Diagnosis Date  . Acute blood loss anemia   . Angioedema 2018  . Asthma   . AVM (arteriovenous malformation) of small bowel, acquired (HCC)    Jejunum - capsule endo 09/2016  . Diabetes mellitus without complication (HCC)   . DVT (deep venous thrombosis) (HCC) 06/2016  . Eczema   . GERD (gastroesophageal reflux disease) 08/17/2016  . GI bleed 12/04/2016  . Gout   . Hyperlipemia   . Hypertension   . Hypothyroidism   . Iron deficiency anemia   . Lip swelling 08/2016  . Melena 12/04/2016  . Open-angle glaucoma   . Osteoarthritis   . Right hip pain 12/07/2016  . Right knee pain 12/07/2016  . Vitamin D deficiency     Past Surgical History:  Procedure Laterality Date  . ABDOMINAL HYSTERECTOMY    . COLONOSCOPY  2018   GA - one polyp  . ESOPHAGOGASTRODUODENOSCOPY  2018   GA - gastritis  . GIVENS CAPSULE STUDY  2018   GA - jejunal AVM's, gastritis 2h 6 min small bowel passage, AVM first at 1 hr post duodenum  . THYROIDECTOMY      Allergies as of 12/13/2016      Reactions   Eggs Or Egg-derived Products Other (See Comments)   Reported by [facility] 11/24/11 - unknown reaction   Losartan Swelling   Swelling of lips and face   Other Other (See Comments)   Hives and facial swelling from all nuts (tree nuts and peanuts)   Peanut-containing Drug Products Hives, Swelling   Facial swelling   Penicillins Other (See Comments)   Childhood allergy- convulstions Has patient had a PCN reaction  causing immediate rash, facial/tongue/throat swelling, SOB or lightheadedness with hypotension: Yes Has patient had a PCN reaction causing severe rash involving mucus membranes or skin necrosis: No Has patient had a PCN reaction that required hospitalization: Yes Has patient had a PCN reaction occurring within the last 10 years: No If all of the above answers are "NO", then may proceed with Cephalosporin use.      Medication List        Accurate as of 12/13/16 11:59 PM. Always use your most recent med list.          allopurinol 100 MG tablet Commonly known as:  ZYLOPRIM Take 100 mg by mouth daily as needed (gout).   aspirin EC 81 MG tablet Take 81 mg by mouth daily.   diclofenac sodium 1 % Gel Commonly known as:  VOLTAREN Apply 2 g topically 4 (four) times daily.   famotidine 40 MG tablet Commonly known as:  PEPCID Take 1 tablet (40 mg total) by mouth daily.   ferrous sulfate 325 (65 FE) MG tablet Take 1 tablet (325 mg total) by mouth daily.   hydrALAZINE 25 MG tablet Commonly known as:  APRESOLINE Take 25 mg by mouth 2 (two) times daily.   levothyroxine 112 MCG tablet Commonly known as:  SYNTHROID, LEVOTHROID Take 112 mcg by mouth daily before breakfast.   metFORMIN 750 MG 24 hr tablet Commonly known as:  GLUCOPHAGE-XR Take 750 mg by mouth 2 (two) times daily.   metoprolol succinate 50 MG 24 hr tablet Commonly known as:  TOPROL-XL Take 50 mg by mouth every evening. Take with or immediately following a meal.   pantoprazole 40 MG tablet Commonly known as:  PROTONIX Take 40 mg by mouth daily.   polyethylene glycol packet Commonly known as:  MIRALAX / GLYCOLAX Take 17 g by mouth daily as needed for mild constipation.   pravastatin 20 MG tablet Commonly known as:  PRAVACHOL Take 20 mg by mouth at bedtime.   rivaroxaban 20 MG Tabs tablet Commonly known as:  XARELTO Take 20 mg by mouth daily.   sucralfate 1 g tablet Commonly known as:  CARAFATE Take 1 g by  mouth 2 (two) times daily.       No orders of the defined types were placed in this encounter.   Immunization History  Administered Date(s) Administered  . Pneumococcal Polysaccharide-23 04/05/2012  . Tdap 04/23/2011    Social History   Tobacco Use  . Smoking status: Never Smoker  . Smokeless tobacco: Never Used  Substance Use Topics  . Alcohol use: No    Family history is   Family History  Problem Relation Age of Onset  . Dementia Mother        died at 49  . Hypertension Mother   . Heart attack Father        died 28  . Hypertension Father   . Ovarian cancer Sister   . Heart attack Brother   . Asthma Son   . Asthma Sister       Review of Systems  DATA OBTAINED: from patient, nurse GENERAL:  no fevers, fatigue, appetite  changes SKIN: No itching, or rash EYES: No eye pain, redness, discharge EARS: No earache, tinnitus, change in hearing NOSE: No congestion, drainage or bleeding  MOUTH/THROAT: No mouth or tooth pain, No sore throat RESPIRATORY: No cough, wheezing, SOB CARDIAC: No chest pain, palpitations, lower extremity edema  GI: No abdominal pain, No N/V/D or constipation, No heartburn or reflux  GU: No dysuria, frequency or urgency, or incontinence  MUSCULOSKELETAL: No unrelieved bone/joint pain NEUROLOGIC: No headache, dizziness or focal weakness PSYCHIATRIC: No c/o anxiety or sadness   Vitals:   12/13/16 1422  BP: 140/85  Pulse: 90  Resp: 18  Temp: (!) 97.1 F (36.2 C)    SpO2 Readings from Last 1 Encounters:  12/10/16 97%   Body mass index is 29.05 kg/m.     Physical Exam  GENERAL APPEARANCE: Alert,   No acute distress.  SKIN: No diaphoresis rash HEAD: Normocephalic, atraumatic  EYES: Conjunctiva/lids clear. Pupils round, reactive. EOMs intact.  EARS: External exam WNL, canals clear. Hearing grossly normal.  NOSE: No deformity or discharge.  MOUTH/THROAT: Lips w/o lesions  RESPIRATORY: Breathing is even, unlabored. Lung sounds are  clear   CARDIOVASCULAR: Heart RRR with gallop no murmurs, rubs or gallops. No peripheral edema.   GASTROINTESTINAL: Abdomen is soft, non-tender, not distended w/ normal bowel sounds. GENITOURINARY: Bladder non tender, not distended  MUSCULOSKELETAL: No abnormal joints or musculature NEUROLOGIC:  Cranial nerves 2-12 grossly intact. Moves all extremities  PSYCHIATRIC: Mood and affect appropriate to situation, no behavioral issues  Patient Active Problem List   Diagnosis Date Noted  . Right knee pain 12/07/2016  . Right hip pain 12/07/2016  . AVM (arteriovenous malformation) of small bowel, acquired (HCC)   . Hypokalemia 12/04/2016  . Melena 12/04/2016  . GI bleed 12/04/2016  . Hematochezia   . Acute blood loss anemia   . Chronic iron deficiency anemia   . Angioedema 08/17/2016  . Blood loss anemia 08/17/2016  . DVT (deep venous thrombosis) (HCC) 08/17/2016  . HTN (hypertension) 08/17/2016  . Hyperlipidemia 08/17/2016  . Microcytic anemia 08/17/2016  . Hypothyroidism 08/17/2016  . GERD (gastroesophageal reflux disease) 08/17/2016      Labs reviewed: Basic Metabolic Panel:    Component Value Date/Time   NA 139 12/10/2016 0400   K 3.7 12/10/2016 0400   CL 105 12/10/2016 0400   CO2 26 12/10/2016 0400   GLUCOSE 104 (H) 12/10/2016 0400   BUN 14 12/10/2016 0400   CREATININE 0.70 12/10/2016 0400   CALCIUM 9.1 12/10/2016 0400   PROT 6.5 12/09/2016 0654   ALBUMIN 2.7 (L) 12/09/2016 0654   AST 16 12/09/2016 0654   ALT 11 (L) 12/09/2016 0654   ALKPHOS 73 12/09/2016 0654   BILITOT 1.2 12/09/2016 0654   GFRNONAA >60 12/10/2016 0400   GFRAA >60 12/10/2016 0400    Recent Labs    12/06/16 0908 12/07/16 0510 12/08/16 0541 12/09/16 0654 12/10/16 0400  NA 140 139 137 138 139  K 3.8 3.6 4.1 3.7 3.7  CL 104 106 103 102 105  CO2 27 25 25 26 26   GLUCOSE 97 106* 102* 100* 104*  BUN 7 7 9 7 14   CREATININE 0.73 0.72 0.75 0.64 0.70  CALCIUM 9.1 8.7* 8.9 9.3 9.1  MG 1.6* 1.5* 1.8   --   --   PHOS 3.3 3.5  --   --   --    Liver Function Tests: Recent Labs    12/07/16 0510 12/08/16 0541 12/09/16 0654  AST 14* 15 16  ALT 10* 11* 11*  ALKPHOS 72 70 73  BILITOT 0.6 0.6 1.2  PROT 6.0* 6.0* 6.5  ALBUMIN 2.6* 2.7* 2.7*   Recent Labs    12/04/16 1251  LIPASE 16   No results for input(s): AMMONIA in the last 8760 hours. CBC: Recent Labs    12/07/16 0510  12/08/16 1007 12/09/16 0654 12/10/16 0400  WBC 8.5  --  11.2* 10.1 10.4  NEUTROABS 6.0  --  8.2*  --  7.3  HGB 7.8*   < > 10.9* 9.7* 9.0*  HCT 24.7*   < > 33.5* 30.7* 28.8*  MCV 80.5  --  80.7 82.7 83.2  PLT 413*  --  PLATELET CLUMPS NOTED ON SMEAR, COUNT APPEARS ADEQUATE 415* 400   < > = values in this interval not displayed.   Lipid No results for input(s): CHOL, HDL, LDLCALC, TRIG in the last 8760 hours.  Cardiac Enzymes: No results for input(s): CKTOTAL, CKMB, CKMBINDEX, TROPONINI in the last 8760 hours. BNP: No results for input(s): BNP in the last 8760 hours. No results found for: [facility] Lab Results  Component Value Date   HGBA1C 5.5 12/07/2016   Lab Results  Component Value Date   TSH 0.359 12/04/2016   No results found for: VITAMINB12 No results found for: FOLATE Lab Results  Component Value Date   IRON 22 (L) 08/17/2016   TIBC 406 08/17/2016   FERRITIN 5 (L) 08/17/2016    Imaging and Procedures obtained prior to SNF admission: Portable Chest 1 View  Result Date: 12/04/2016 CLINICAL DATA:  Dyspnea which shortness-of-breath, lightheadedness and weakness today. EXAM: PORTABLE CHEST 1 VIEW COMPARISON:  None. FINDINGS: Lungs are adequately inflated without focal consolidation or effusion. Cardiomediastinal silhouette is within normal. There is calcified plaque over the thoracic aorta. There are degenerative changes of the spine and shoulders. IMPRESSION: No acute cardiopulmonary disease. Electronically Signed   By: Elberta Fortisaniel  Boyle M.D.   On: 12/04/2016 17:14     Not all labs,  radiology exams or other studies done during hospitalization come through on my EPIC note; however they are reviewed by me.    Assessment and Plan  MELENA/ ACUTE BLOOD LOSS ANEMIA/chronic anticoagulation/IRON DEFICIENCY ANEMIA-acute on chro, hemoglobin 4.3 on admission, with an MCV of 73.7; patient's status for transfusion 4 units packed RBCswith a DC hemoglobin of 9.0; O2 saturation on room air greater than 90%; no DVT on recent scan left lower extremity; patient restarted on home iron; patient  restarted on Xarelto 11/1 after conversation with family about risk-benefit of recurrent DVT with immobility. SNF - admitted for generalized weakness for OT/PT; plan to follow CBC; plan to continue iron 325 mg by mouth daily  LEFT LEG DVT-recent ultrasound showed no DVT;reportedto have been diagnosed 4 months ago; patient was started on xarelto 11/1 after iscussion with family and patient of risk versus benefit of recurrent DVT with mmobility SNF - ontinues a relative 20 mg by mouth daily; will follow CBC  DIABETES MELLITUS2-ome hyperglycemia in the hospital but hemoglobin A1c was 5.5 SNF - atient restarted on home medication ofmetformin X are 750 mg by mouth twice a day; patient is on statin  HYPERTENSION SNF - ontrolled; plan to continue hydralazine 25 mg twice a day and Toprol-XL 50 mg 1 by mouth daily at bedtime  hYPOTHYOIDISM-TSH was 0.359 SNF - stable; plan to continue Synthroid 112 g by mouth daily  GERD SNF - continue Pepcid 40 mg by mouth daily per GI and Carafate 1 g by mouth  twice a day  HYPOKALEMIA/HYPOMAGNESEMIA"repleted SNF - will follow-up BMP plus mag  RIGHT HIP PAIN/RIGHT KNEE PAIN-severe DJD; SNF - pain control with PT; and with Tylenol and Voltaren gel    Time spent  Greater than 45 minutes;> 50% of time with patient was spent reviewing records, labs, tests and studies, counseling and developing plan of care  Thurston Hole D. Lyn Hollingshead, MD

## 2016-12-16 ENCOUNTER — Telehealth: Payer: Self-pay

## 2016-12-16 ENCOUNTER — Other Ambulatory Visit: Payer: Self-pay

## 2016-12-16 NOTE — Telephone Encounter (Signed)
Faxed order over to SNF, [facility], [phone], need to draw CBC w/ diff. Send results to Dr. Leone PayorGessner.

## 2016-12-17 ENCOUNTER — Other Ambulatory Visit: Payer: Self-pay

## 2016-12-18 ENCOUNTER — Encounter: Payer: Self-pay | Admitting: Internal Medicine

## 2016-12-18 DIAGNOSIS — E119 Type 2 diabetes mellitus without complications: Secondary | ICD-10-CM | POA: Insufficient documentation

## 2016-12-18 DIAGNOSIS — M1990 Unspecified osteoarthritis, unspecified site: Secondary | ICD-10-CM | POA: Insufficient documentation

## 2016-12-18 LAB — CBC AND DIFFERENTIAL
HEMATOCRIT: 25 — AB (ref 36–46)
HEMOGLOBIN: 8.3 — AB (ref 12.0–16.0)
PLATELETS: 386 (ref 150–399)
WBC: 7.2

## 2016-12-20 ENCOUNTER — Encounter: Payer: Self-pay | Admitting: Internal Medicine

## 2016-12-20 ENCOUNTER — Non-Acute Institutional Stay (SKILLED_NURSING_FACILITY): Payer: Medicare Other | Admitting: Internal Medicine

## 2016-12-20 DIAGNOSIS — M159 Polyosteoarthritis, unspecified: Secondary | ICD-10-CM

## 2016-12-20 DIAGNOSIS — M19212 Secondary osteoarthritis, left shoulder: Secondary | ICD-10-CM

## 2016-12-20 DIAGNOSIS — M75102 Unspecified rotator cuff tear or rupture of left shoulder, not specified as traumatic: Secondary | ICD-10-CM

## 2016-12-20 NOTE — Progress Notes (Signed)
[facility] Room Number: [phone] Place of Service:   SNF [phone](31) Provider: Randon GoldsmithAnne D. Lyn HollingsheadAlexander, MD  Patient, No Pcp Per  Patient Care Team: Patient, No Pcp Per as PCP - General (General Practice)  Extended Emergency Contact Information Primary Emergency Contact: Milta DeitersHarvey,Kim  [place] Home Phone: [phone] Relation: Sister    Allergies: Eggs or egg-derived products; Losartan; Other; Peanut-containing drug products; and Penicillins  Chief Complaint  Patient presents with  . Acute Visit    for joint pain    HPI: Patient is 71 y.o. female who   Past Medical History:  Diagnosis Date  . Acute blood loss anemia   . Angioedema 2018  . Asthma   . AVM (arteriovenous malformation) of small bowel, acquired (HCC)    Jejunum - capsule endo 09/2016  . Diabetes mellitus without complication (HCC)   . DVT (deep venous thrombosis) (HCC) 06/2016  . Eczema   . GERD (gastroesophageal reflux disease) 08/17/2016  . GI bleed 12/04/2016  . Gout   . Hyperlipemia   . Hypertension   . Hypothyroidism   . Iron deficiency anemia   . Lip swelling 08/2016  . Melena 12/04/2016  . Open-angle glaucoma   . Osteoarthritis   . Right hip pain 12/07/2016  . Right knee pain 12/07/2016  . Vitamin D deficiency     Past Surgical History:  Procedure Laterality Date  . ABDOMINAL HYSTERECTOMY    . COLONOSCOPY  2018   GA - one polyp  . ESOPHAGOGASTRODUODENOSCOPY  2018   GA - gastritis  . GIVENS CAPSULE STUDY  2018   GA - jejunal AVM's, gastritis 2h 6 min small bowel passage, AVM first at 1 hr post duodenum  . THYROIDECTOMY      Allergies as of 12/20/2016      Reactions   Eggs Or Egg-derived Products Other (See Comments)   [facility] - unknown reaction   Losartan Swelling   Swelling of lips and face   Other Other (See Comments)   Hives and facial swelling from all nuts (tree nuts and peanuts)   Peanut-containing Drug  Products Hives, Swelling   Facial swelling   Penicillins Other (See Comments)   Childhood allergy- convulstions Has patient had a PCN reaction causing immediate rash, facial/tongue/throat swelling, SOB or lightheadedness with hypotension: Yes Has patient had a PCN reaction causing severe rash involving mucus membranes or skin necrosis: No Has patient had a PCN reaction that required hospitalization: Yes Has patient had a PCN reaction occurring within the last 10 years: No If all of the above answers are "NO", then may proceed with Cephalosporin use.      Medication List        Accurate as of 12/20/16  2:02 PM. Always use your most recent med list.          allopurinol 100 MG tablet Commonly known as:  ZYLOPRIM Take 100 mg by mouth daily as needed (gout).   aspirin EC 81 MG tablet Take 81 mg by mouth daily.   diclofenac sodium 1 % Gel Commonly known as:  VOLTAREN Apply 2 g topically 4 (four) times daily.   famotidine 40 MG tablet Commonly known as:  PEPCID Take 1 tablet (40 mg total) by mouth daily.   ferrous sulfate 325 (65 FE) MG tablet Take 1 tablet (325 mg total) by mouth daily.   hydrALAZINE 25 MG tablet Commonly known as:  APRESOLINE Take 25 mg  by mouth 2 (two) times daily.   levothyroxine 112 MCG tablet Commonly known as:  SYNTHROID, LEVOTHROID Take 112 mcg by mouth daily before breakfast.   metFORMIN 750 MG 24 hr tablet Commonly known as:  GLUCOPHAGE-XR Take 750 mg by mouth 2 (two) times daily.   metoprolol succinate 50 MG 24 hr tablet Commonly known as:  TOPROL-XL Take 50 mg by mouth every evening. Take with or immediately following a meal.   pantoprazole 40 MG tablet Commonly known as:  PROTONIX Take 40 mg by mouth daily.   polyethylene glycol packet Commonly known as:  MIRALAX / GLYCOLAX Take 17 g by mouth daily as needed for mild constipation.   pravastatin 20 MG tablet Commonly known as:  PRAVACHOL Take 20 mg by mouth at bedtime.     rivaroxaban 20 MG Tabs tablet Commonly known as:  XARELTO Take 20 mg by mouth daily.   sucralfate 1 g tablet Commonly known as:  CARAFATE Take 1 g by mouth 2 (two) times daily.       No orders of the defined types were placed in this encounter.   Immunization History  Administered Date(s) Administered  . Pneumococcal Polysaccharide-23 04/05/2012  . Tdap 04/23/2011    Social History   Tobacco Use  . Smoking status: Never Smoker  . Smokeless tobacco: Never Used  Substance Use Topics  . Alcohol use: No    Review of Systems  DATA OBTAINED: from patient, nurse, medical record, family member GENERAL:  no fevers, fatigue, appetite changes SKIN: No itching, rash HEENT: No complaint RESPIRATORY: No cough, wheezing, SOB CARDIAC: No chest pain, palpitations, lower extremity edema  GI: No abdominal pain, No N/V/D or constipation, No heartburn or reflux  GU: No dysuria, frequency or urgency, or incontinence  MUSCULOSKELETAL: No unrelieved bone/joint pain NEUROLOGIC: No headache, dizziness  PSYCHIATRIC: No overt anxiety or sadness  Vitals:   12/20/16 1352  BP: (!) 144/74  Pulse: 86  Resp: 19  Temp: 98.2 F (36.8 C)   Body mass index is 29.41 kg/m. Physical Exam  GENERAL APPEARANCE: Alert, conversant, No acute distress  SKIN: No diaphoresis rash HEENT: Unremarkable RESPIRATORY: Breathing is even, unlabored. Lung sounds are clear   CARDIOVASCULAR: Heart RRR no murmurs, rubs or gallops. No peripheral edema  GASTROINTESTINAL: Abdomen is soft, non-tender, not distended w/ normal bowel sounds.  GENITOURINARY: Bladder non tender, not distended  MUSCULOSKELETAL: No abnormal joints or musculature NEUROLOGIC: Cranial nerves 2-12 grossly intact. Moves all extremities PSYCHIATRIC: Mood and affect appropriate to situation, no behavioral issues  Patient Active Problem List   Diagnosis Date Noted  . Diabetes mellitus without complication (HCC) 12/18/2016  . Osteoarthritis  12/18/2016  . Right knee pain 12/07/2016  . Right hip pain 12/07/2016  . AVM (arteriovenous malformation) of small bowel, acquired (HCC)   . Hypokalemia 12/04/2016  . Melena 12/04/2016  . GI bleed 12/04/2016  . Hematochezia   . Acute blood loss anemia   . Chronic iron deficiency anemia   . Angioedema 08/17/2016  . Blood loss anemia 08/17/2016  . DVT (deep venous thrombosis) (HCC) 08/17/2016  . HTN (hypertension) 08/17/2016  . Hyperlipidemia 08/17/2016  . Microcytic anemia 08/17/2016  . Hypothyroidism 08/17/2016  . GERD (gastroesophageal reflux disease) 08/17/2016    CMP     Component Value Date/Time   NA 139 12/13/2016   K 4.2 12/13/2016   CL 105 12/10/2016 0400   CO2 26 12/10/2016 0400   GLUCOSE 104 (H) 12/10/2016 0400   BUN 13 12/13/2016  CREATININE 0.6 12/13/2016   CREATININE 0.70 12/10/2016 0400   CALCIUM 9.1 12/10/2016 0400   PROT 6.5 12/09/2016 0654   ALBUMIN 2.7 (L) 12/09/2016 0654   AST 16 12/09/2016 0654   ALT 11 (L) 12/09/2016 0654   ALKPHOS 73 12/09/2016 0654   BILITOT 1.2 12/09/2016 0654   GFRNONAA >60 12/10/2016 0400   GFRAA >60 12/10/2016 0400   Recent Labs    12/06/16 0908 12/07/16 0510 12/08/16 0541 12/09/16 0654 12/10/16 0400 12/13/16  NA 140 139 137 138 139 139  K 3.8 3.6 4.1 3.7 3.7 4.2  CL 104 106 103 102 105  --   CO2 27 25 25 26 26   --   GLUCOSE 97 106* 102* 100* 104*  --   BUN 7 7 9 7 14 13   CREATININE 0.73 0.72 0.75 0.64 0.70 0.6  CALCIUM 9.1 8.7* 8.9 9.3 9.1  --   MG 1.6* 1.5* 1.8  --   --   --   PHOS 3.3 3.5  --   --   --   --    Recent Labs    12/07/16 0510 12/08/16 0541 12/09/16 0654  AST 14* 15 16  ALT 10* 11* 11*  ALKPHOS 72 70 73  BILITOT 0.6 0.6 1.2  PROT 6.0* 6.0* 6.5  ALBUMIN 2.6* 2.7* 2.7*   Recent Labs    12/07/16 0510  12/08/16 1007 12/09/16 0654 12/10/16 0400 12/13/16  WBC 8.5  --  11.2* 10.1 10.4 9.1  NEUTROABS 6.0  --  8.2*  --  7.3  --   HGB 7.8*   < > 10.9* 9.7* 9.0* 9.0*  HCT 24.7*   < > 33.5*  30.7* 28.8* 29*  MCV 80.5  --  80.7 82.7 83.2  --   PLT 413*  --  PLATELET CLUMPS NOTED ON SMEAR, COUNT APPEARS ADEQUATE 415* 400 428*   < > = values in this interval not displayed.   No results for input(s): CHOL, LDLCALC, TRIG in the last 8760 hours.  Invalid input(s): HCL No results found for: [facility] Lab Results  Component Value Date   TSH 0.359 12/04/2016   Lab Results  Component Value Date   HGBA1C 5.5 12/07/2016   No results found for: CHOL, HDL, LDLCALC, LDLDIRECT, TRIG, CHOLHDL  Significant Diagnostic Results in last 30 days:  Portable Chest 1 View  Result Date: 12/04/2016 CLINICAL DATA:  Dyspnea which shortness-of-breath, lightheadedness and weakness today. EXAM: PORTABLE CHEST 1 VIEW COMPARISON:  None. FINDINGS: Lungs are adequately inflated without focal consolidation or effusion. Cardiomediastinal silhouette is within normal. There is calcified plaque over the thoracic aorta. There are degenerative changes of the spine and shoulders. IMPRESSION: No acute cardiopulmonary disease. Electronically Signed   By: Elberta Fortis M.D.   On: 12/04/2016 17:14   Dg Knee Complete 4 Views Right  Result Date: 12/07/2016 CLINICAL DATA:  Chronic right knee pain. EXAM: RIGHT KNEE - COMPLETE 4+ VIEW COMPARISON:  None. FINDINGS: No evidence of fracture, dislocation, or joint effusion. Severe narrowing of patellofemoral space is noted with osteophyte formation. Moderate narrowing of the medial and lateral joint spaces is noted with osteophyte formation. Soft tissues are unremarkable. IMPRESSION: Moderate to severe degenerative joint disease. No acute abnormality seen in the right knee. Electronically Signed   By: Lupita Raider, M.D.   On: 12/07/2016 14:43   Dg Hip Unilat With Pelvis 2-3 Views Right  Result Date: 12/07/2016 CLINICAL DATA:  Chronic right hip pain. EXAM: DG HIP (WITH OR WITHOUT  PELVIS) 2-3V RIGHT COMPARISON:  None. FINDINGS: Severe narrowing of both hip joints is noted  consistent with severe degenerative joint disease. Sclerosis and lucency is seen involving both femoral heads suggesting avascular necrosis. 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 No acute abnormality is noted. Electronically Signed   By: Lupita RaiderJames  Green Jr, M.D.   On: 12/07/2016 14:42    Assessment and Plan  No problem-specific Assessment & Plan notes found for this encounter.   Labs/tests ordered:    Randon GoldsmithAnne D. Lyn HollingsheadAlexander, MD

## 2016-12-20 NOTE — Progress Notes (Signed)
Location:  [facility]  rovider : Margit Hanks M.D. Place of Service:   killed nursing facility  Patient, No Pcp Per  Patient Care Team: Patient, No Pcp Per as PCP - General (General Practice)  Extended Emergency Contact Information Primary Emergency Contact: Malena, [place] Home Phone: [phone] Relation: Sister    Allergies: Eggs or egg-derived products; Losartan; Other; Peanut-containing drug products; and Penicillins  Chief Complaint  Patient presents with  . Acute Visit    for joint pain    HPI: Patient is 71 y.o. female who who I am seeing today for joint pain. Physical therapy has asked me if we can get her referred to orthopedic surgery for joint injections as the pain in her right knee right hip and left shoulder is interfering with her rehabilitation. Patient was hospital for the same, not deemed to be a surgical candidate at this time Per patient she has had injections prior the last ones being in June 2018 in [place]. Since then patient has moved from [place] to [place] and now to [place], and essentially she got to [place] she was admitted to the hospital and has not had time to set up a primary care physician.   Past Medical History:  Diagnosis Date  . Acute blood loss anemia   . Angioedema 2018  . Asthma   . AVM (arteriovenous malformation) of small bowel, acquired (HCC)    Jejunum - capsule endo 09/2016  . Diabetes mellitus without complication (HCC)   . DVT (deep venous thrombosis) (HCC) 06/2016  . Eczema   . GERD (gastroesophageal reflux disease) 08/17/2016  . GI bleed 12/04/2016  . Gout   . Hyperlipemia   . Hypertension   . Hypothyroidism   . Iron deficiency anemia   . Lip swelling 08/2016  . Melena 12/04/2016  . Open-angle glaucoma   . Osteoarthritis   . Right hip pain 12/07/2016  . Right knee pain 12/07/2016  . Vitamin D deficiency     Past Surgical History:  Procedure Laterality  Date  . ABDOMINAL HYSTERECTOMY    . COLONOSCOPY  2018   [place] - one polyp  . ESOPHAGOGASTRODUODENOSCOPY  2018   [place] - gastritis  . GIVENS CAPSULE STUDY  2018   [place] - jejunal AVM's, gastritis 2h 6 min small bowel passage, AVM first at 1 hr post duodenum  . THYROIDECTOMY      Allergies as of 12/20/2016      Reactions   Eggs Or Egg-derived Products Other (See Comments)   Reported by [facility] 11/24[facility]11 - unknown reaction   Losartan Swelling   Swelling of lips and face   Other Other (See Comments)   Hives and facial swelling from all nuts (tree nuts and peanuts)   Peanut-containing Drug Products Hives, Swelling   Facial swelling   Penicillins Other (See Comments)   Childhood allergy- convulstions Has patient had a PCN reaction causing immediate rash, facial/tongue/throat swelling, SOB or lightheadedness with hypotension: Yes Has patient had a PCN reaction causing severe rash involving mucus membranes or skin necrosis: No Has patient had a PCN reaction that required hospitalization: Yes Has patient had a PCN reaction occurring within the last 10 years: No If all of the above answers are "NO", then may proceed with Cephalosporin use.      Medication List        Accurate as of 12/20/16  6:47 PM. Always use your most recent med list.  allopurinol 100 MG tablet Commonly known as:  ZYLOPRIM Take 100 mg by mouth daily as needed (gout).   aspirin EC 81 MG tablet Take 81 mg by mouth daily.   diclofenac sodium 1 % Gel Commonly known as:  VOLTAREN Apply 2 g topically 4 (four) times daily.   famotidine 40 MG tablet Commonly known as:  PEPCID Take 1 tablet (40 mg total) by mouth daily.   ferrous sulfate 325 (65 FE) MG tablet Take 1 tablet (325 mg total) by mouth daily.   hydrALAZINE 25 MG tablet Commonly known as:  APRESOLINE Take 25 mg by mouth 2 (two) times daily.   levothyroxine 112 MCG tablet Commonly known as:  SYNTHROID, LEVOTHROID Take 112 mcg by mouth daily  before breakfast.   metFORMIN 750 MG 24 hr tablet Commonly known as:  GLUCOPHAGE-XR Take 750 mg by mouth 2 (two) times daily.   metoprolol succinate 50 MG 24 hr tablet Commonly known as:  TOPROL-XL Take 50 mg by mouth every evening. Take with or immediately following a meal.   pantoprazole 40 MG tablet Commonly known as:  PROTONIX Take 40 mg by mouth daily.   polyethylene glycol packet Commonly known as:  MIRALAX / GLYCOLAX Take 17 g by mouth daily as needed for mild constipation.   pravastatin 20 MG tablet Commonly known as:  PRAVACHOL Take 20 mg by mouth at bedtime.   rivaroxaban 20 MG Tabs tablet Commonly known as:  XARELTO Take 20 mg by mouth daily.   sucralfate 1 g tablet Commonly known as:  CARAFATE Take 1 g by mouth 2 (two) times daily.       No orders of the defined types were placed in this encounter.   Immunization History  Administered Date(s) Administered  . Pneumococcal Polysaccharide-23 04/05/2012  . Tdap 04/23/2011    Social History   Tobacco Use  . Smoking status: Never Smoker  . Smokeless tobacco: Never Used  Substance Use Topics  . Alcohol use: No    Review of Systems  DATA OBTAINED: from patient, PT GENERAL:  no fevers, fatigue, appetite changes SKIN: No itching, rash HEENT: No complaint RESPIRATORY: No cough, wheezing, SOB CARDIAC: No chest pain, palpitations, lower extremity edema  GI: No abdominal pain, No N/V/D or constipation, No heartburn or reflux  GU: No dysuria, frequency or urgency, or incontinence  MUSCULOSKELETAL: + unrelieved bone/joint pain knees, left shoulder, hips NEUROLOGIC: No headache, dizziness  PSYCHIATRIC: No overt anxiety or sadness  Vitals:   12/20/16 1352  BP: (!) 144/74  Pulse: 86  Resp: 19  Temp: 98.2 F (36.8 C)   Body mass index is 29.41 kg/m. Physical Exam  GENERAL APPEARANCE: Alert, conversant, No acute distress  SKIN: No diaphoresis rash HEENT: Unremarkable RESPIRATORY: Breathing is  even, unlabored. Lung sounds are clear   CARDIOVASCULAR: Heart RRR no murmurs, rubs or gallops. No peripheral edema  GASTROINTESTINAL: Abdomen is soft, non-tender, not distended w/ normal bowel sounds.  GENITOURINARY: Bladder non tender, not distended  MUSCULOSKELETAL: diffuse arthritis NEUROLOGIC: Cranial nerves 2-12 grossly intact. Moves all extremities PSYCHIATRIC: Mood and affect appropriate to situation, no behavioral issues  Patient Active Problem List   Diagnosis Date Noted  . Diabetes mellitus without complication (HCC) 12/18/2016  . Osteoarthritis 12/18/2016  . Right knee pain 12/07/2016  . Right hip pain 12/07/2016  . AVM (arteriovenous malformation) of small bowel, acquired (HCC)   . Hypokalemia 12/04/2016  . Melena 12/04/2016  . GI bleed 12/04/2016  . Hematochezia   . Acute blood  loss anemia   . Chronic iron deficiency anemia   . Angioedema 08/17/2016  . Blood loss anemia 08/17/2016  . DVT (deep venous thrombosis) (HCC) 08/17/2016  . HTN (hypertension) 08/17/2016  . Hyperlipidemia 08/17/2016  . Microcytic anemia 08/17/2016  . Hypothyroidism 08/17/2016  . GERD (gastroesophageal reflux disease) 08/17/2016    CMP     Component Value Date/Time   NA 139 12/13/2016   K 4.2 12/13/2016   CL 105 12/10/2016 0400   CO2 26 12/10/2016 0400   GLUCOSE 104 (H) 12/10/2016 0400   BUN 13 12/13/2016   CREATININE 0.6 12/13/2016   CREATININE 0.70 12/10/2016 0400   CALCIUM 9.1 12/10/2016 0400   PROT 6.5 12/09/2016 0654   ALBUMIN 2.7 (L) 12/09/2016 0654   AST 16 12/09/2016 0654   ALT 11 (L) 12/09/2016 0654   ALKPHOS 73 12/09/2016 0654   BILITOT 1.2 12/09/2016 0654   GFRNONAA >60 12/10/2016 0400   GFRAA >60 12/10/2016 0400   Recent Labs    12/06/16 0908 12/07/16 0510 12/08/16 0541 12/09/16 0654 12/10/16 0400 12/13/16  NA 140 139 137 138 139 139  K 3.8 3.6 4.1 3.7 3.7 4.2  CL 104 106 103 102 105  --   CO2 27 25 25 26 26   --   GLUCOSE 97 106* 102* 100* 104*  --   BUN  7 7 9 7 14 13   CREATININE 0.73 0.72 0.75 0.64 0.70 0.6  CALCIUM 9.1 8.7* 8.9 9.3 9.1  --   MG 1.6* 1.5* 1.8  --   --   --   PHOS 3.3 3.5  --   --   --   --    Recent Labs    12/07/16 0510 12/08/16 0541 12/09/16 0654  AST 14* 15 16  ALT 10* 11* 11*  ALKPHOS 72 70 73  BILITOT 0.6 0.6 1.2  PROT 6.0* 6.0* 6.5  ALBUMIN 2.6* 2.7* 2.7*   Recent Labs    12/07/16 0510  12/08/16 1007 12/09/16 0654 12/10/16 0400 12/13/16  WBC 8.5  --  11.2* 10.1 10.4 9.1  NEUTROABS 6.0  --  8.2*  --  7.3  --   HGB 7.8*   < > 10.9* 9.7* 9.0* 9.0*  HCT 24.7*   < > 33.5* 30.7* 28.8* 29*  MCV 80.5  --  80.7 82.7 83.2  --   PLT 413*  --  PLATELET CLUMPS NOTED ON SMEAR, COUNT APPEARS ADEQUATE 415* 400 428*   < > = values in this interval not displayed.   No results for input(s): CHOL, LDLCALC, TRIG in the last 8760 hours.  Invalid input(s): HCL No results found for: [facility] Lab Results  Component Value Date   TSH 0.359 12/04/2016   Lab Results  Component Value Date   HGBA1C 5.5 12/07/2016   No results found for: CHOL, HDL, LDLCALC, LDLDIRECT, TRIG, CHOLHDL  Significant Diagnostic Results in last 30 days:  Portable Chest 1 View  Result Date: 12/04/2016 CLINICAL DATA:  Dyspnea which shortness-of-breath, lightheadedness and weakness today. EXAM: PORTABLE CHEST 1 VIEW COMPARISON:  None. FINDINGS: Lungs are adequately inflated without focal consolidation or effusion. Cardiomediastinal silhouette is within normal. There is calcified plaque over the thoracic aorta. There are degenerative changes of the spine and shoulders. IMPRESSION: No acute cardiopulmonary disease. Electronically Signed   By: Elberta Fortis M.D.   On: 12/04/2016 17:14   Dg Knee Complete 4 Views Right  Result Date: 12/07/2016 CLINICAL DATA:  Chronic right knee pain. EXAM: RIGHT KNEE -  COMPLETE 4+ VIEW COMPARISON:  None. FINDINGS: No evidence of fracture, dislocation, or joint effusion. Severe narrowing of patellofemoral space is  noted with osteophyte formation. Moderate narrowing of the medial and lateral joint spaces is noted with osteophyte formation. Soft tissues are unremarkable. IMPRESSION: Moderate to severe degenerative joint disease. No acute abnormality seen in the right knee. Electronically Signed   By: Lupita RaiderJames  Green Jr, M.D.   On: 12/07/2016 14:43   Dg Hip Unilat With Pelvis 2-3 Views Right  Result Date: 12/07/2016 CLINICAL DATA:  Chronic right hip pain. EXAM: DG HIP (WITH OR WITHOUT PELVIS) 2-3V RIGHT COMPARISON:  None. FINDINGS: Severe narrowing of both hip joints is noted consistent with severe degenerative joint disease. Sclerosis and lucency is seen involving both femoral heads suggesting avascular necrosis. 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 No acute abnormality is noted. Electronically Signed   By: Lupita RaiderJames  Green Jr, M.D.   On: 12/07/2016 14:42    Assessment and Plan  END-stage OSTEOARTHRITIS/ROTATOR CUFF TEAR LEFT SHOULDR- multiple joints involved especially right knee and right hip; patient is not at this time a candidate for joint replacement which is recommended;have written foal to orthopedic surg for hopefully joint injections which patient has said has helped tremendously in the past; also patient needs referral for a PCP-have written [facility]    Merrilee SeashoreAnne Myrla Malanowski, MD

## 2016-12-21 ENCOUNTER — Other Ambulatory Visit: Payer: Self-pay

## 2016-12-23 ENCOUNTER — Other Ambulatory Visit: Payer: Self-pay

## 2016-12-23 LAB — CBC AND DIFFERENTIAL
HEMATOCRIT: 25 — AB (ref 36–46)
Hemoglobin: 7.9 — AB (ref 12.0–16.0)
PLATELETS: 494 — AB (ref 150–399)
WBC: 7.3

## 2016-12-24 ENCOUNTER — Telehealth: Payer: Self-pay

## 2016-12-24 NOTE — Telephone Encounter (Signed)
-----   Message from Iva Booparl E Gessner, MD sent at 12/24/2016  8:00 AM EST ----- Regarding: needs f/u appt Needs appt w/ us - APP ok  Hospital follow-up GI bleed  December ok

## 2016-12-24 NOTE — Telephone Encounter (Signed)
Left message on machine to call back  

## 2016-12-27 ENCOUNTER — Non-Acute Institutional Stay (SKILLED_NURSING_FACILITY): Payer: Medicare Other | Admitting: Internal Medicine

## 2016-12-27 ENCOUNTER — Encounter: Payer: Self-pay | Admitting: Internal Medicine

## 2016-12-27 DIAGNOSIS — K922 Gastrointestinal hemorrhage, unspecified: Secondary | ICD-10-CM | POA: Diagnosis not present

## 2016-12-27 DIAGNOSIS — E119 Type 2 diabetes mellitus without complications: Secondary | ICD-10-CM

## 2016-12-27 DIAGNOSIS — M25561 Pain in right knee: Secondary | ICD-10-CM

## 2016-12-27 DIAGNOSIS — E876 Hypokalemia: Secondary | ICD-10-CM | POA: Diagnosis not present

## 2016-12-27 DIAGNOSIS — E034 Atrophy of thyroid (acquired): Secondary | ICD-10-CM | POA: Diagnosis not present

## 2016-12-27 DIAGNOSIS — I10 Essential (primary) hypertension: Secondary | ICD-10-CM

## 2016-12-27 DIAGNOSIS — D500 Iron deficiency anemia secondary to blood loss (chronic): Secondary | ICD-10-CM

## 2016-12-27 DIAGNOSIS — I82402 Acute embolism and thrombosis of unspecified deep veins of left lower extremity: Secondary | ICD-10-CM

## 2016-12-27 DIAGNOSIS — K219 Gastro-esophageal reflux disease without esophagitis: Secondary | ICD-10-CM | POA: Diagnosis not present

## 2016-12-27 DIAGNOSIS — Z7901 Long term (current) use of anticoagulants: Secondary | ICD-10-CM | POA: Insufficient documentation

## 2016-12-27 DIAGNOSIS — K921 Melena: Secondary | ICD-10-CM

## 2016-12-27 DIAGNOSIS — D509 Iron deficiency anemia, unspecified: Secondary | ICD-10-CM | POA: Diagnosis not present

## 2016-12-27 DIAGNOSIS — M25551 Pain in right hip: Secondary | ICD-10-CM

## 2016-12-27 DIAGNOSIS — G8929 Other chronic pain: Secondary | ICD-10-CM

## 2016-12-27 NOTE — Progress Notes (Signed)
Location:  [facility] Room Number: 110 Place of Service:  SNF [phone])  Provider: Randon Goldsmith. Lyn Hollingshead, MD  PCP: Patient, No Pcp Per Patient Care Team: Patient, No Pcp Per as PCP - General (General Practice)  Extended Emergency Contact Information Primary Emergency Contact: [place], [place] Home Phone: [phone] Relation: Sister  Allergies  Allergen Reactions  . Eggs Or Egg-Derived Products Other (See Comments)    Reported by [facility] 11/24/11 - unknown reaction  . Losartan Swelling    Swelling of lips and face  . Other Other (See Comments)    Hives and facial swelling from all nuts (tree nuts and peanuts)  . Peanut-Containing Drug Products Hives and Swelling    Facial swelling  . Penicillins Other (See Comments)    Childhood allergy- convulstions Has patient had a PCN reaction causing immediate rash, facial/tongue/throat swelling, SOB or lightheadedness with hypotension: Yes Has patient had a PCN reaction causing severe rash involving mucus membranes or skin necrosis: No Has patient had a PCN reaction that required hospitalization: Yes Has patient had a PCN reaction occurring within the last 10 years: No If all of the above answers are "NO", then may proceed with Cephalosporin use.    Chief Complaint  Patient presents with  . Discharge Note    discharage from SNF to home    HPI:  71 y.o. female  with recent DVT on xarelto, hypertension, hyperlipidemia, hypothyroidism and chronic GI bleeding with iron deficiency anemia who presented to [facility] ED with complaints of melena. Initial evaluation revealed a hemoglobin of 4.3. Patient was admitted to [facility] from 10/27-11/2 with lower GI bleed with severe anemia. Patient was transfused with 3 units of packed RBCs and hemoglobin remained stable around 9 after that. GI performed a small bowel endoscopy on 12/06/16 which not show any active bleeding. There was some  conversation around whether patient's xarelto should be stopped or not. Patient obtained her DVT from immobility from osteoarthritis and patient continues to be not mobile; therefore the decision was made to consent continue xarelto which was restarted on 12/09/16. Patient was admitted to skilled nursing facility with generalized weakness for OT/PT and is now ready to be discharged to home.    Past Medical History:  Diagnosis Date  . Acute blood loss anemia   . Angioedema 2018  . Asthma   . AVM (arteriovenous malformation) of small bowel, acquired (HCC)    Jejunum - capsule endo 09/2016  . Diabetes mellitus without complication (HCC)   . DVT (deep venous thrombosis) (HCC) 06/2016  . Eczema   . GERD (gastroesophageal reflux disease) 08/17/2016  . GI bleed 12/04/2016  . Gout   . Hyperlipemia   . Hypertension   . Hypothyroidism   . Iron deficiency anemia   . Lip swelling 08/2016  . Melena 12/04/2016  . Open-angle glaucoma   . Osteoarthritis   . Right hip pain 12/07/2016  . Right knee pain 12/07/2016  . Vitamin D deficiency     Past Surgical History:  Procedure Laterality Date  . ABDOMINAL HYSTERECTOMY    . COLONOSCOPY  2018   [place] - one polyp  . ENTEROSCOPY N/A 12/06/2016   Performed by Sherrilyn Rist, MD at [facility] ENDOSCOPY  . ESOPHAGOGASTRODUODENOSCOPY  2018   GA - gastritis  . GIVENS CAPSULE STUDY  2018   GA - jejunal AVM's, gastritis 2h 6 min small bowel passage, AVM first at 1 hr post  duodenum  . THYROIDECTOMY       reports that  has never smoked. she has never used smokeless tobacco. She reports that she does not drink alcohol or use drugs. Social History   Socioeconomic History  . Marital status: Single    Spouse name: Not on file  . Number of children: 2  . Years of education: 3912  . Highest education level: Not on file  Social Needs  . Financial resource strain: Not on file  . Food insecurity - worry: Not on file  . Food insecurity - inability: Not on file  .  Transportation needs - medical: Not on file  . Transportation needs - non-medical: Not on file  Occupational History  . 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 Smoking status: Never Smoker  . Smokeless tobacco: Never Used  Substance and Sexual Activity  . Alcohol use: No  . Drug use: No  . Sexual activity: Not on file  Other Topics Concern  . Not on file  Social History Narrative   HS graduate   Retired [employer]   [id number]   Son lives [place]   Daughter in [place] - 4--# septic shock - deceased? - was in [place] caring for her   Ad[facility] 12/10/16   Never married   Never smoked   Alcohol none   Full Code    Pertinent  Health Maintenance Due  Topic Date Due  . FOOT EXAM  10/31/1957  . OPHTHALMOLOGY EXAM  10/31/1957  . URINE MICROALBUMIN  10/31/1957  . MAMMOGRAM  10/31/1997  . COLONOSCOPY  10/31/1997  . DEXA SCAN  10/31/2012  . PNA vac Low Risk Adult (1 of 2 - PCV13) 04/05/2013  . INFLUENZA VACCINE  09/08/2016  . HEMOGLOBIN A1C  06/07/2017    Medications: Allergies as of 12/27/2016      Reactions   Eggs Or Egg-derived Products Other (See Comments)   [facility] 11/24/11 - unknown reaction   Losartan Swelling   Swelling of lips and face   Other Other (See Comments)   Hives and facial swelling from all nuts (tree nuts and peanuts)   Peanut-containing Drug Products Hives, Swelling   Facial swelling   Penicillins Other (See Comments)   Childhood allergy- convulstions Has patient had a PCN reaction causing immediate rash, facial/tongue/throat swelling, SOB or lightheadedness with hypotension: Yes Has patient had a PCN reaction causing severe rash involving mucus membranes or skin necrosis: No Has patient had a PCN reaction that required hospitalization: Yes Has patient had a PCN reaction occurring within the last 10 years: No If all of the above answers are "NO", then may proceed with Cephalosporin use.        Medication List        Accurate as of 12/27/16  4:32 PM. Always use your most recent med list.          allopurinol 100 MG tablet Commonly known as:  ZYLOPRIM Take 100 mg daily by mouth.   aspirin EC 81 MG tablet Take 81 mg by mouth daily.   diclofenac sodium 1 % Gel Commonly known as:  VOLTAREN Apply 2 g topically 4 (four) times daily.   famotidine 40 MG tablet Commonly known as:  PEPCID Take 1 tablet (40 mg total) by mouth daily.   ferrous sulfate 325 (65 FE) MG tablet Take 1 tablet (325 mg total) by mouth daily.   hydrALAZINE 25 MG tablet Commonly  known as:  APRESOLINE Take 25 mg by mouth 2 (two) times daily.   levothyroxine 112 MCG tablet Commonly known as:  SYNTHROID, LEVOTHROID Take 112 mcg by mouth daily before breakfast.   metFORMIN 750 MG 24 hr tablet Commonly known as:  GLUCOPHAGE-XR Take 750 mg by mouth 2 (two) times daily.   metoprolol succinate 50 MG 24 hr tablet Commonly known as:  TOPROL-XL Take 50 mg by mouth every evening. Take with or immediately following a meal.   pantoprazole 40 MG tablet Commonly known as:  PROTONIX Take 40 mg by mouth daily.   polyethylene glycol packet Commonly known as:  MIRALAX / GLYCOLAX Take 17 g by mouth daily as needed for mild constipation.   pravastatin 20 MG tablet Commonly known as:  PRAVACHOL Take 20 mg by mouth at bedtime.   rivaroxaban 20 MG Tabs tablet Commonly known as:  XARELTO Take 20 mg by mouth daily.   sucralfate 1 g tablet Commonly known as:  CARAFATE Take 1 g by mouth 2 (two) times daily.        Vitals:   12/27/16 1054  BP: 132/74  Pulse: 87  Resp: 20  Temp: (!) 97.3 F (36.3 C)  SpO2: 95%  Weight: 177 lb (80.3 kg)  Height: 5\' 3"  (1.6 m)   Body mass index is 31.35 kg/m.  Physical Exam  GENERAL APPEARANCE: Alert, conversant. No acute distress.  HEENT: Unremarkable. RESPIRATORY: Breathing is even, unlabored. Lung sounds are clear   CARDIOVASCULAR: Heart RRR no murmurs,  rubs or gallops. No peripheral edema.  GASTROINTESTINAL: Abdomen is soft, non-tender, not distended w/ normal bowel sounds.  NEUROLOGIC: Cranial nerves 2-12 grossly intact. Moves all extremities   Labs reviewed: Basic Metabolic Panel: Recent Labs    12/06/16 0908 12/07/16 0510 12/08/16 0541 12/09/16 0654 12/10/16 0400 12/13/16  NA 140 139 137 138 139 139  K 3.8 3.6 4.1 3.7 3.7 4.2  CL 104 106 103 102 105  --   CO2 27 25 25 26 26   --   GLUCOSE 97 106* 102* 100* 104*  --   BUN 7 7 9 7 14 13   CREATININE 0.73 0.72 0.75 0.64 0.70 0.6  CALCIUM 9.1 8.7* 8.9 9.3 9.1  --   MG 1.6* 1.5* 1.8  --   --   --   PHOS 3.3 3.5  --   --   --   --    No results found for: [facility] Liver Function Tests: Recent Labs    12/07/16 0510 12/08/16 0541 12/09/16 0654  AST 14* 15 16  ALT 10* 11* 11*  ALKPHOS 72 70 73  BILITOT 0.6 0.6 1.2  PROT 6.0* 6.0* 6.5  ALBUMIN 2.6* 2.7* 2.7*   Recent Labs    12/04/16 1251  LIPASE 16   No results for input(s): AMMONIA in the last 8760 hours. CBC: Recent Labs    12/07/16 0510  12/08/16 1007 12/09/16 0654 12/10/16 0400 12/13/16 12/18/16 12/23/16  WBC 8.5  --  11.2* 10.1 10.4 9.1 7.2 7.3  NEUTROABS 6.0  --  8.2*  --  7.3  --   --   --   HGB 7.8*   < > 10.9* 9.7* 9.0* 9.0* 8.3* 7.9*  HCT 24.7*   < > 33.5* 30.7* 28.8* 29* 25* 25*  MCV 80.5  --  80.7 82.7 83.2  --   --   --   PLT 413*  --  PLATELET CLUMPS NOTED ON SMEAR, COUNT APPEARS ADEQUATE 415* 400 428*  386 494*   < > = values in this interval not displayed.   Lipid No results for input(s): CHOL, HDL, LDLCALC, TRIG in the last 8760 hours. Cardiac Enzymes: No results for input(s): CKTOTAL, CKMB, CKMBINDEX, TROPONINI in the last 8760 hours. BNP: No results for input(s): BNP in the last 8760 hours. CBG: Recent Labs    12/10/16 0746 12/10/16 1214 12/10/16 1605  GLUCAP 94 99 101*    Procedures and Imaging Studies During Stay: Portable Chest 1 View  Result Date: 12/04/2016 CLINICAL  DATA:  Dyspnea which shortness-of-breath, lightheadedness and weakness today. EXAM: PORTABLE CHEST 1 VIEW COMPARISON:  None. FINDINGS: Lungs are adequately inflated without focal consolidation or effusion. Cardiomediastinal silhouette is within normal. There is calcified plaque over the thoracic aorta. There are degenerative changes of the spine and shoulders. IMPRESSION: No acute cardiopulmonary disease. Electronically Signed   By: Elberta Fortisaniel  Boyle M.D.   On: 12/04/2016 17:14   Dg Knee Complete 4 Views Right  Result Date: 12/07/2016 CLINICAL DATA:  Chronic right knee pain. EXAM: RIGHT KNEE - COMPLETE 4+ VIEW COMPARISON:  None. FINDINGS: No evidence of fracture, dislocation, or joint effusion. Severe narrowing of patellofemoral space is noted with osteophyte formation. Moderate narrowing of the medial and lateral joint spaces is noted with osteophyte formation. Soft tissues are unremarkable. IMPRESSION: Moderate to severe degenerative joint disease. No acute abnormality seen in the right knee. Electronically Signed   By: Lupita RaiderJames  Green Jr, M.D.   On: 12/07/2016 14:43   Dg Hip Unilat With Pelvis 2-3 Views Right  Result Date: 12/07/2016 CLINICAL DATA:  Chronic right hip pain. EXAM: DG HIP (WITH OR WITHOUT PELVIS) 2-3V RIGHT COMPARISON:  None. FINDINGS: Severe narrowing of both hip joints is noted consistent with severe degenerative joint disease. Sclerosis and lucency is seen involving both femoral heads suggesting avascular necrosis. 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 No acute abnormality is noted. Electronically Signed   By: Lupita RaiderJames  Green Jr, M.D.   On: 12/07/2016 14:42    Assessment/Plan:   Melena  Gastrointestinal hemorrhage, unspecified gastrointestinal hemorrhage type  Blood loss anemia  Chronic iron deficiency anemia  Chronic anticoagulation  Deep vein  thrombosis (DVT) of left lower extremity, unspecified chronicity, unspecified vein (HCC)  Essential hypertension  Diabetes mellitus without complication (HCC)  Hypothyroidism due to acquired atrophy of thyroid  Gastroesophageal reflux disease without esophagitis  Hypokalemia  Chronic pain of right knee  Right hip pain  Hypomagnesemia   Patient is being discharged with the following home health services:  OT/PT/nursing/home health aide  Patient is being discharged with the following durable medical equipment: Standard wheelchair   Patient has been advised to f/u with their PCP in 1-2 weeks to bring them up to date on their rehab stay.  Social services at facility was responsible for arranging this appointment.  Pt was provided with a 30 day supply of prescriptions for medications and refills must be obtained from their PCP.  For controlled substances, a more limited supply may be provided adequate until PCP appointment only.  Patient's medications have been reconciled.  Time spent greater than 30 minutes;> 50% of time with patient was spent reviewing records, labs, tests and studies, counseling and developing plan of care  Randon Goldsmithnne D. Lyn HollingsheadAlexander, MD

## 2016-12-29 NOTE — Telephone Encounter (Signed)
Patient scheduled for 01/17/17 10:15

## 2017-01-03 ENCOUNTER — Ambulatory Visit (INDEPENDENT_AMBULATORY_CARE_PROVIDER_SITE_OTHER): Payer: Medicare Other | Admitting: Orthopaedic Surgery

## 2017-01-03 ENCOUNTER — Encounter (INDEPENDENT_AMBULATORY_CARE_PROVIDER_SITE_OTHER): Payer: Self-pay | Admitting: Orthopaedic Surgery

## 2017-01-03 ENCOUNTER — Ambulatory Visit (INDEPENDENT_AMBULATORY_CARE_PROVIDER_SITE_OTHER): Payer: Medicare Other

## 2017-01-03 VITALS — BP 162/86 | HR 14 | Resp 14 | Ht 63.0 in | Wt 177.0 lb

## 2017-01-03 DIAGNOSIS — G8929 Other chronic pain: Secondary | ICD-10-CM

## 2017-01-03 DIAGNOSIS — M25512 Pain in left shoulder: Secondary | ICD-10-CM

## 2017-01-03 MED ORDER — METHYLPREDNISOLONE ACETATE 40 MG/ML IJ SUSP
80.0000 mg | INTRAMUSCULAR | Status: AC | PRN
  Administered 2017-01-03: 80 mg

## 2017-01-03 MED ORDER — BUPIVACAINE HCL 0.5 % IJ SOLN
2.0000 mL | INTRAMUSCULAR | Status: AC | PRN
  Administered 2017-01-03: 2 mL via INTRA_ARTICULAR

## 2017-01-03 MED ORDER — LIDOCAINE HCL 2 % IJ SOLN
2.0000 mL | INTRAMUSCULAR | Status: AC | PRN
  Administered 2017-01-03: 2 mL

## 2017-01-03 NOTE — Progress Notes (Signed)
Office Visit Note   Patient: Dana Nelson           Date of Birth: 23-Dec-1945           MRN: [id number] Visit Date: 01/03/2017              Requested by: No referring provider defined for this encounter. PCP: Macy Mis, MD   Assessment & Plan: Visit Diagnoses:  1. Chronic left shoulder pain     Plan: End-stage osteoarthritis left shoulder. 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 We'll plan on injecting left shoulder with cortisone today and see back in 2 weeks to evaluate  Injecting right knee. Long discussion over 45 minutes with Dana Nelson and family guarding her multiple problems and end-stage joint issues. She certainly would benefit from a right total knee replacement initially. But has end-stage changes on both hips and knees which will limit her rehabilitation  Follow-Up Instructions: Return in about 2 weeks (around 01/17/2017).   Orders:  Orders Placed This Encounter  Procedures  . XR Shoulder Left   No orders of the defined types were placed in this encounter.     Procedures: Large Joint Inj: L subacromial bursa on 01/03/2017 11:29 AM Indications: pain and diagnostic evaluation Details: 25 G 1.5 in needle, anterolateral approach  Arthrogram: No  Medications: 2 mL lidocaine 2 %; 2 mL bupivacaine 0.5 %; 80 mg methylPREDNISolone acetate 40 MG/ML Consent was given by the patient. Immediately prior to procedure a time out was called to verify the correct patient, procedure, equipment, support staff and site/side marked as required. Patient was prepped and draped in the usual sterile fashion.       Clinical Data: No additional findings.   Subjective: Chief Complaint  Patient presents with  . Right Hip - Pain    Dana Nelson is a 71 y o the is here for Right shoulder pain,Left knee pain. She went [facility] 10/2o 7 and admitted  For Blood Loss anemia. Then went to [facility] and got out 12/28/16.  Marland Kitchen Left Shoulder - Pain    Pt is  also having problems with her R knee. She has fluis pooled at her ankles  Dana Nelson has a long and complicated history of multiple joint arthralgias. 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 At one time she was scheduled for a right hip replacement but developed a DVT. She has been on xarelto since June with occult bleeding requiring transfusions and a least 2 occasions. She has an appointment to be seen at [facility]. She has been to the emergency room on numerous occasions as she does not have a primary care physician. She does have an appointment to see the primary care physician some time in December. She has a number of comorbidities including "pre-diabetes. She recently spent some time in a local rehabilitation facility and is now home by herself with physical therapy. She is complaining of persistent pain of both of her hips and knees which limits her ability to ambulate. Also having considerable pain and limitation of motion of her left shoulder. I reviewed films of her hips and knees on the PACS system. There are end-stage degenerative changes in both hips and, particularly, right knee  HPI  Review of Systems  Constitutional: Positive for fatigue. Negative for chills and fever.  Eyes: Negative for itching.  Respiratory: Negative for chest  tightness and shortness of breath.   Cardiovascular: Negative for chest pain, palpitations and leg swelling.  Gastrointestinal: Negative for blood in stool, constipation and diarrhea.  Endocrine: Negative for polyuria.  Genitourinary: Negative for dysuria.  Musculoskeletal: Positive for joint swelling. Negative for back pain, neck pain and neck stiffness.  Allergic/Immunologic: Negative for immunocompromised state.  Neurological: Negative for dizziness and numbness.  Hematological: Does not bruise/bleed easily.  Psychiatric/Behavioral: Positive for sleep  disturbance. The patient is not nervous/anxious.      Objective: Vital Signs: BP (!) 162/86   Pulse (!) 14   Resp 14   Ht 5\' 3"  (1.6 m)   Wt 177 lb (80.3 kg)   BMI 31.35 kg/m   Physical Exam  Ortho Exam evaluated in a wheelchair. Awake alert and oriented 3. Tearful at times regarding all of her pain. Cervical pain with range of motion of both hips. Very minimal motion from neutral position. Also was diffuse pain in both of her knees particularly the right. Full extension and probably about 95 of flexion. No instability. Positive patellar crepitation and pain along both joint lines bilaterally. Mild ankle swelling. Diffuse areas of skin deep pigmentation. Left shoulder with about 65 of abduction and about 75 of flexion with crepitation and considerable pain. Minimal external rotation.   Imaging: Xr Shoulder Left  Result Date: 01/03/2017 Films of the left shoulder were obtained in several projections. There is end-stage osteoarthritis with little if any joint space between the humeral head and the glenoid. Large inferior humeral head osteophyte associated with some flattening of the humeral head. Sclerosis identified on both sides of the joint. Also degenerative changes at the acromioclavicular joint. Normal space between the humeral head and the acromium. No ectopic calcification    PMFS History: Patient Active Problem List   Diagnosis Date Noted  . Chronic anticoagulation 12/27/2016  . Hypomagnesemia 12/27/2016  . Diabetes mellitus without complication (HCC) 12/18/2016  . Osteoarthritis 12/18/2016  . Right knee pain 12/07/2016  . Right hip pain 12/07/2016  . AVM (arteriovenous malformation) of small bowel, acquired (HCC)   . Hypokalemia 12/04/2016  . Melena 12/04/2016  . GI bleed 12/04/2016  . Hematochezia   . Acute blood loss anemia   . Chronic iron deficiency anemia   . Angioedema 08/17/2016  . Blood loss anemia 08/17/2016  . DVT (deep venous thrombosis) (HCC)  08/17/2016  . HTN (hypertension) 08/17/2016  . Hyperlipidemia 08/17/2016  . Microcytic anemia 08/17/2016  . Hypothyroidism 08/17/2016  . GERD (gastroesophageal reflux disease) 08/17/2016   Past Medical History:  Diagnosis Date  . Acute blood loss anemia   . Angioedema 2018  . Asthma   . AVM (arteriovenous malformation) of small bowel, acquired (HCC)    Jejunum - capsule endo 09/2016  . Diabetes mellitus without complication (HCC)   . DVT (deep venous thrombosis) (HCC) 06/2016  . Eczema   . GERD (gastroesophageal reflux disease) 08/17/2016  . GI bleed 12/04/2016  . Gout   . Hyperlipemia   . Hypertension   . Hypothyroidism   . Iron deficiency anemia   . Lip swelling 08/2016  . Melena 12/04/2016  . Open-angle glaucoma   . Osteoarthritis   . Right hip pain 12/07/2016  . Right knee pain 12/07/2016  . Vitamin D deficiency     Family History  Problem Relation Age of Onset  . Dementia Mother        died at 6886  . Hypertension Mother   . Heart attack Father  died 1593  . Hypertension Father   . Ovarian cancer Sister   . Heart attack Brother   . Asthma Son   . Asthma Sister     Past Surgical History:  Procedure Laterality Date  . ABDOMINAL HYSTERECTOMY    . COLONOSCOPY  2018   GA - one polyp  . ENTEROSCOPY N/A 12/06/2016   Procedure: ENTEROSCOPY;  Surgeon: Sherrilyn Ristanis, Henry L III, MD;  [facility];  Service: Gastroenterology;  Laterality: N/A;  . ESOPHAGOGASTRODUODENOSCOPY  2018   GA - gastritis  . GIVENS CAPSULE STUDY  2018   GA - jejunal AVM's, gastritis 2h 6 min small bowel passage, AVM first at 1 hr post duodenum  . THYROIDECTOMY     Social History   Occupational History  . 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 Smoking status: Never Smoker  . Smokeless tobacco: Never Used  Substance and Sexual Activity  . Alcohol use: No  . Drug use: No  . Sexual activity: Not on file

## 2017-01-10 ENCOUNTER — Encounter (INDEPENDENT_AMBULATORY_CARE_PROVIDER_SITE_OTHER): Payer: Self-pay | Admitting: Orthopaedic Surgery

## 2017-01-10 ENCOUNTER — Ambulatory Visit (INDEPENDENT_AMBULATORY_CARE_PROVIDER_SITE_OTHER): Payer: Medicare Other | Admitting: Orthopaedic Surgery

## 2017-01-10 VITALS — BP 177/89 | HR 83 | Resp 14 | Ht 63.0 in | Wt 185.0 lb

## 2017-01-10 DIAGNOSIS — M25561 Pain in right knee: Secondary | ICD-10-CM | POA: Diagnosis not present

## 2017-01-10 DIAGNOSIS — G8929 Other chronic pain: Secondary | ICD-10-CM

## 2017-01-10 MED ORDER — LIDOCAINE HCL 1 % IJ SOLN
2.0000 mL | INTRAMUSCULAR | Status: AC | PRN
  Administered 2017-01-10: 2 mL

## 2017-01-10 MED ORDER — METHYLPREDNISOLONE ACETATE 40 MG/ML IJ SUSP
80.0000 mg | INTRAMUSCULAR | Status: AC | PRN
  Administered 2017-01-10: 80 mg

## 2017-01-10 MED ORDER — BUPIVACAINE HCL 0.5 % IJ SOLN
2.0000 mL | INTRAMUSCULAR | Status: AC | PRN
  Administered 2017-01-10: 2 mL via INTRA_ARTICULAR

## 2017-01-10 NOTE — Progress Notes (Signed)
Office Visit Note   Patient: Dana EpleyRegina Nelson           Date of Birth: 1945/07/30           MRN: [id number] Visit Date: 01/10/2017              Requested by: Macy MisBriscoe, Kim K, MD [address] [place], [place] [address] PCP: Macy MisBriscoe, Kim K, MD   Assessment & Plan: Visit Diagnoses:  1. Chronic pain of right knee     Plan: Osteoarthritis right knee. Will inject with cortisone and reevaluate in 2 weeks. Consider injecting left knee  Follow-Up Instructions: Return if symptoms worsen or fail to improve.   Orders:  Orders Placed This Encounter  Procedures  . Large Joint Inj: R knee   No orders of the defined types were placed in this encounter.     Procedures: Large Joint Inj: R knee on 01/10/2017 3:10 PM Indications: pain and diagnostic evaluation Details: 25 G 1.5 in needle, anteromedial approach  Arthrogram: No  Medications: 2 mL lidocaine 1 %; 2 mL bupivacaine 0.5 %; 80 mg methylPREDNISolone acetate 40 MG/ML Procedure, treatment alternatives, risks and benefits explained, specific risks discussed. Consent was given by the patient. Immediately prior to procedure a time out was called to verify the correct patient, procedure, equipment, support staff and site/side marked as required. Patient was prepped and draped in the usual sterile fashion.       Clinical Data: No additional findings.   Subjective: Chief Complaint  Patient presents with  . Right Knee - Weakness    [name] is a 89 y o here for chronic Right knee pain and wants an injection. Ambulates with a walker.  Seen several weeks ago for evaluation of shoulder and bilateral knee pain. Shoulder injection was helpful. Now wishes to have a cortisone injection in her right knee. Films demonstrating tricompartmental degenerative changes.  HPI  Review of Systems  Constitutional: Negative for chills, fatigue and fever.  Eyes: Negative for itching.  Respiratory: Negative for chest tightness and  shortness of breath.   Cardiovascular: Negative for chest pain, palpitations and leg swelling.  Gastrointestinal: Negative for blood in stool, constipation and diarrhea.  Endocrine: Negative for polyuria.  Genitourinary: Negative for dysuria.  Musculoskeletal: Positive for arthralgias. Negative for back pain, joint swelling, neck pain and neck stiffness.  Allergic/Immunologic: Negative for immunocompromised state.  Neurological: Negative for dizziness and numbness.  Hematological: Does not bruise/bleed easily.  Psychiatric/Behavioral: Positive for sleep disturbance. The patient is not nervous/anxious.      Objective: Vital Signs: BP (!) 177/89   Pulse 83   Resp 14   Ht 5\' 3"  (1.6 m)   Wt 185 lb (83.9 kg)   BMI 32.77 kg/m   Physical Exam  Ortho Exam awake alert or oriented 3. Sitting in wheelchair. Right knee with increased varus and predominantly medial joint pain. No effusion. Lacks a few degrees to full extension and flexes over 100 without instability. No localized areas of tenderness except along the anterior medial joint line area and skin intact  Specialty Comments:  No specialty comments available.  Imaging: No results found.   PMFS History: Patient Active Problem List   Diagnosis Date Noted  . Chronic anticoagulation 12/27/2016  . Hypomagnesemia 12/27/2016  . Diabetes mellitus without complication (HCC) 12/18/2016  . Osteoarthritis 12/18/2016  . Right knee pain 12/07/2016  . Right hip pain 12/07/2016  . AVM (arteriovenous malformation) of small bowel, acquired (HCC)   .  Hypokalemia 12/04/2016  . Melena 12/04/2016  . GI bleed 12/04/2016  . Hematochezia   . Acute blood loss anemia   . Chronic iron deficiency anemia   . Angioedema 08/17/2016  . Blood loss anemia 08/17/2016  . DVT (deep venous thrombosis) (HCC) 08/17/2016  . HTN (hypertension) 08/17/2016  . Hyperlipidemia 08/17/2016  . Microcytic anemia 08/17/2016  . Hypothyroidism 08/17/2016  . GERD  (gastroesophageal reflux disease) 08/17/2016   Past Medical History:  Diagnosis Date  . Acute blood loss anemia   . Angioedema 2018  . Asthma   . AVM (arteriovenous malformation) of small bowel, acquired (HCC)    Jejunum - capsule endo 09/2016  . Diabetes mellitus without complication (HCC)   . DVT (deep venous thrombosis) (HCC) 06/2016  . Eczema   . GERD (gastroesophageal reflux disease) 08/17/2016  . GI bleed 12/04/2016  . Gout   . Hyperlipemia   . Hypertension   . Hypothyroidism   . Iron deficiency anemia   . Lip swelling 08/2016  . Melena 12/04/2016  . Open-angle glaucoma   . Osteoarthritis   . Right hip pain 12/07/2016  . Right knee pain 12/07/2016  . Vitamin D deficiency     Family History  Problem Relation Age of Onset  . Dementia Mother        died at [address]486  . Hypertension Mother   . Heart attack Father        died 1393  . Hypertension Father   . Ovarian cancer Sister   . Heart attack Brother   . Asthma Son   . Asthma Sister     Past Surgical History:  Procedure Laterality Date  . ABDOMINAL HYSTERECTOMY    . COLONOSCOPY  2018   GA - one polyp  . ENTEROSCOPY N/A 12/06/2016   Procedure: ENTEROSCOPY;  Surgeon: Sherrilyn Ristanis, Henry L III, MD;  [facility];  Service: Gastroenterology;  Laterality: N/A;  . ESOPHAGOGASTRODUODENOSCOPY  2018   GA - gastritis  . GIVENS CAPSULE STUDY  2018   GA - jejunal AVM's, gastritis 2h 6 min small bowel passage, AVM first at 1 hr post duodenum  . THYROIDECTOMY     Social History   Occupational History  . 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 Smoking status: Never Smoker  . Smokeless tobacco: Never Used  Substance and Sexual Activity  . Alcohol use: No  . Drug use: No  . Sexual activity: Not on file

## 2017-01-17 ENCOUNTER — Ambulatory Visit: Payer: BLUE CROSS/BLUE SHIELD | Admitting: Internal Medicine

## 2017-01-24 ENCOUNTER — Encounter (INDEPENDENT_AMBULATORY_CARE_PROVIDER_SITE_OTHER): Payer: Self-pay | Admitting: Orthopaedic Surgery

## 2017-01-24 ENCOUNTER — Ambulatory Visit (INDEPENDENT_AMBULATORY_CARE_PROVIDER_SITE_OTHER): Payer: Medicare Other | Admitting: Orthopaedic Surgery

## 2017-01-24 VITALS — BP 160/80 | HR 70 | Ht 62.0 in | Wt 180.0 lb

## 2017-01-24 DIAGNOSIS — M170 Bilateral primary osteoarthritis of knee: Secondary | ICD-10-CM

## 2017-01-24 DIAGNOSIS — M1712 Unilateral primary osteoarthritis, left knee: Secondary | ICD-10-CM

## 2017-01-24 MED ORDER — METHYLPREDNISOLONE ACETATE 40 MG/ML IJ SUSP
80.0000 mg | INTRAMUSCULAR | Status: AC | PRN
  Administered 2017-01-24: 80 mg

## 2017-01-24 MED ORDER — LIDOCAINE HCL 1 % IJ SOLN
2.0000 mL | INTRAMUSCULAR | Status: AC | PRN
  Administered 2017-01-24: 2 mL

## 2017-01-24 MED ORDER — BUPIVACAINE HCL 0.5 % IJ SOLN
2.0000 mL | INTRAMUSCULAR | Status: AC | PRN
  Administered 2017-01-24: 2 mL via INTRA_ARTICULAR

## 2017-01-24 NOTE — Progress Notes (Signed)
Office Visit Note   Patient: Dana Nelson           Date of Birth: Oct 04, 1945           MRN: [id number] Visit Date: 01/24/2017              ReAvis Epleyquested by: Macy MisBriscoe, Kim K, MD [address] [place], [place] [address] PCP: Macy MisBriscoe, Kim K, MD   Assessment & Plan: Visit Diagnoses:  1. Bilateral primary osteoarthritis of knee     Plan: Orders are injection left knee. Right knee injection 2 weeks ago made a big difference. Encouraged her to get an exercise bicycle and spend more time on her feet on warfarin she's continued so weak that she will not  walk effectively  Follow-Up Instructions: Return if symptoms worsen or fail to improve.   Orders:  No orders of the defined types were placed in this encounter.  No orders of the defined types were placed in this encounter.     Procedures: Large Joint Inj: L knee on 01/24/2017 2:30 PM Indications: pain and diagnostic evaluation Details: 25 G 1.5 in needle, anteromedial approach  Arthrogram: No  Medications: 2 mL lidocaine 1 %; 2 mL bupivacaine 0.5 %; 80 mg methylPREDNISolone acetate 40 MG/ML Procedure, treatment alternatives, risks and benefits explained, specific risks discussed. Consent was given by the patient. Patient was prepped and draped in the usual sterile fashion.       Clinical Data: No additional findings.   Subjective: Chief Complaint  Patient presents with  . Left Knee - Injections, Pain    Dana Nelson is a 71 y o with chronic bilateral knee pain.  Accompanied by family members. Dana Nelson relates that the injection her right knee several weeks ago made a difference and she like to have a cortisone injection in her left knee. She does receive therapy and tries to exercise at home. She spent some time on her feet over the course of the day but does spend a lot of time in the wheelchair  HPI  Review of Systems   Objective: Vital Signs: BP (!) 160/80   Pulse 70   Ht 5\' 2"  (1.575 m)   Wt  180 lb (81.6 kg)   BMI 32.92 kg/m   Physical Exam  Ortho Exam awake alert and oriented 3. Examined in a wheelchair. Right and left knee were not hot warm red or swollen. Predominantly medial joint pain left knee. No effusion. Full extension and flexion about 105 without instability. No calf pain noted. No distal edema.  Specialty Comments:  No specialty comments available.  Imaging: No results found.   PMFS History: Patient Active Problem List   Diagnosis Date Noted  . Chronic anticoagulation 12/27/2016  . Hypomagnesemia 12/27/2016  . Diabetes mellitus without complication (HCC) 12/18/2016  . Osteoarthritis 12/18/2016  . Right knee pain 12/07/2016  . Right hip pain 12/07/2016  . AVM (arteriovenous malformation) of small bowel, acquired (HCC)   . Hypokalemia 12/04/2016  . Melena 12/04/2016  . GI bleed 12/04/2016  . Hematochezia   . Acute blood loss anemia   . Chronic iron deficiency anemia   . Angioedema 08/17/2016  . Blood loss anemia 08/17/2016  . DVT (deep venous thrombosis) (HCC) 08/17/2016  . HTN (hypertension) 08/17/2016  . Hyperlipidemia 08/17/2016  . Microcytic anemia 08/17/2016  . Hypothyroidism 08/17/2016  . GERD (gastroesophageal reflux disease) 08/17/2016   Past Medical History:  Diagnosis Date  . Acute blood loss anemia   .  Angioedema 2018  . Asthma   . AVM (arteriovenous malformation) of small bowel, acquired (HCC)    Jejunum - capsule endo 09/2016  . Diabetes mellitus without complication (HCC)   . DVT (deep venous thrombosis) (HCC) 06/2016  . Eczema   . GERD (gastroesophageal reflux disease) 08/17/2016  . GI bleed 12/04/2016  . Gout   . Hyperlipemia   . Hypertension   . Hypothyroidism   . Iron deficiency anemia   . Lip swelling 08/2016  . Melena 12/04/2016  . Open-angle glaucoma   . Osteoarthritis   . Right hip pain 12/07/2016  . Right knee pain 12/07/2016  . Vitamin D deficiency     Family History  Problem Relation Age of Onset  .  Dementia Mother        died at 2986  . Hypertension Mother   . Heart attack Father        died 2093  . Hypertension Father   . Ovarian cancer Sister   . Heart attack Brother   . Asthma Son   . Asthma Sister     Past Surgical History:  Procedure Laterality Date  . ABDOMINAL HYSTERECTOMY    . COLONOSCOPY  2018   GA - one polyp  . ENTEROSCOPY N/A 12/06/2016   Procedure: ENTEROSCOPY;  Surgeon: Sherrilyn Ristanis, Henry L III, MD;  [facility];  Service: Gastroenterology;  Laterality: N/A;  . ESOPHAGOGASTRODUODENOSCOPY  2018   GA - gastritis  . GIVENS CAPSULE STUDY  2018   GA - jejunal AVM's, gastritis 2h 6 min small bowel passage, AVM first at 1 hr post duodenum  . THYROIDECTOMY     Social History   Occupational History  . 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 Smoking status: Never Smoker  . Smokeless tobacco: Never Used  Substance and Sexual Activity  . Alcohol use: No  . Drug use: No  . Sexual activity: Not on file     Valeria BatmanPeter W Allyah Heather, MD   Note - This record has been created using [employer] software.  Chart creation errors have been sought, but may not always  have been located. Such creation errors do not reflect on  the standard of medical care.

## 2017-01-24 NOTE — Progress Notes (Deleted)
Office Visit Note   Patient: Dana EpleyRegina Nelson           Date of Birth: 07/29/1945           MRN: [id number] Visit Date: 01/24/2017              Requested by: Macy MisBriscoe, Kim K, MD [address] [place], [place] [address] PCP: Macy MisBriscoe, Kim K, MD   Assessment & Plan: Visit Diagnoses: No diagnosis found.  Plan: ***  Follow-Up Instructions: No Follow-up on file.   Orders:  No orders of the defined types were placed in this encounter.  No orders of the defined types were placed in this encounter.     Procedures: No procedures performed   Clinical Data: No additional findings.   Subjective: Chief Complaint  Patient presents with  . Left Knee - Injections, Pain    Dana Nelson is a 71 y o with chronic bilateral knee pain.    HPI  Review of Systems  Constitutional: Negative for chills, fatigue and fever.  Eyes: Negative for itching.  Respiratory: Negative for chest tightness and shortness of breath.   Cardiovascular: Negative for chest pain, palpitations and leg swelling.  Gastrointestinal: Negative for blood in stool, constipation and diarrhea.  Endocrine: Negative for polyuria.  Genitourinary: Negative for dysuria.  Musculoskeletal: Negative for back pain, joint swelling, neck pain and neck stiffness.  Allergic/Immunologic: Negative for immunocompromised state.  Neurological: Negative for dizziness and numbness.  Hematological: Does not bruise/bleed easily.  Psychiatric/Behavioral: The patient is not nervous/anxious.      Objective: Vital Signs: BP (!) 160/80   Pulse 70   Ht 5\' 2"  (1.575 m)   Wt 180 lb (81.6 kg)   BMI 32.92 kg/m   Physical Exam  Ortho Exam  Specialty Comments:  No specialty comments available.  Imaging: No results found.   PMFS History: Patient Active Problem List   Diagnosis Date Noted  . Chronic anticoagulation 12/27/2016  . Hypomagnesemia 12/27/2016  . Diabetes mellitus without complication (HCC) 12/18/2016  .  Osteoarthritis 12/18/2016  . Right knee pain 12/07/2016  . Right hip pain 12/07/2016  . AVM (arteriovenous malformation) of small bowel, acquired (HCC)   . Hypokalemia 12/04/2016  . Melena 12/04/2016  . GI bleed 12/04/2016  . Hematochezia   . Acute blood loss anemia   . Chronic iron deficiency anemia   . Angioedema 08/17/2016  . Blood loss anemia 08/17/2016  . DVT (deep venous thrombosis) (HCC) 08/17/2016  . HTN (hypertension) 08/17/2016  . Hyperlipidemia 08/17/2016  . Microcytic anemia 08/17/2016  . Hypothyroidism 08/17/2016  . GERD (gastroesophageal reflux disease) 08/17/2016   Past Medical History:  Diagnosis Date  . Acute blood loss anemia   . Angioedema 2018  . Asthma   . AVM (arteriovenous malformation) of small bowel, acquired (HCC)    Jejunum - capsule endo 09/2016  . Diabetes mellitus without complication (HCC)   . DVT (deep venous thrombosis) (HCC) 06/2016  . Eczema   . GERD (gastroesophageal reflux disease) 08/17/2016  . GI bleed 12/04/2016  . Gout   . Hyperlipemia   . Hypertension   . Hypothyroidism   . Iron deficiency anemia   . Lip swelling 08/2016  . Melena 12/04/2016  . Open-angle glaucoma   . Osteoarthritis   . Right hip pain 12/07/2016  . Right knee pain 12/07/2016  . Vitamin D deficiency     Family History  Problem Relation Age of Onset  . Dementia Mother  died at 69  . Hypertension Mother   . Heart attack Father        died 69  . Hypertension Father   . Ovarian cancer Sister   . Heart attack Brother   . Asthma Son   . Asthma Sister     Past Surgical History:  Procedure Laterality Date  . ABDOMINAL HYSTERECTOMY    . COLONOSCOPY  2018   GA - one polyp  . ENTEROSCOPY N/A 12/06/2016   Procedure: ENTEROSCOPY;  Surgeon: Sherrilyn Ristanis, Henry L III, MD;  [facility];  Service: Gastroenterology;  Laterality: N/A;  . ESOPHAGOGASTRODUODENOSCOPY  2018   GA - gastritis  . GIVENS CAPSULE STUDY  2018   GA - jejunal AVM's, gastritis 2h 6  min small bowel passage, AVM first at 1 hr post duodenum  . THYROIDECTOMY     Social History   Occupational History  . 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 Smoking status: Never Smoker  . Smokeless tobacco: Never Used  Substance and Sexual Activity  . Alcohol use: No  . Drug use: No  . Sexual activity: Not on file

## 2017-01-27 ENCOUNTER — Ambulatory Visit: Payer: BLUE CROSS/BLUE SHIELD | Admitting: Physician Assistant

## 2017-02-18 ENCOUNTER — Encounter (INDEPENDENT_AMBULATORY_CARE_PROVIDER_SITE_OTHER): Payer: Self-pay

## 2017-02-18 ENCOUNTER — Telehealth: Payer: Self-pay | Admitting: *Deleted

## 2017-02-18 ENCOUNTER — Ambulatory Visit (INDEPENDENT_AMBULATORY_CARE_PROVIDER_SITE_OTHER): Payer: Medicare Other | Admitting: Physician Assistant

## 2017-02-18 ENCOUNTER — Encounter: Payer: Self-pay | Admitting: Physician Assistant

## 2017-02-18 ENCOUNTER — Other Ambulatory Visit (INDEPENDENT_AMBULATORY_CARE_PROVIDER_SITE_OTHER): Payer: Medicare Other

## 2017-02-18 VITALS — BP 140/80 | HR 82 | Ht 63.0 in | Wt 164.0 lb

## 2017-02-18 DIAGNOSIS — E039 Hypothyroidism, unspecified: Secondary | ICD-10-CM

## 2017-02-18 DIAGNOSIS — D509 Iron deficiency anemia, unspecified: Secondary | ICD-10-CM | POA: Diagnosis not present

## 2017-02-18 DIAGNOSIS — Q2733 Arteriovenous malformation of digestive system vessel: Secondary | ICD-10-CM

## 2017-02-18 DIAGNOSIS — K5520 Angiodysplasia of colon without hemorrhage: Secondary | ICD-10-CM

## 2017-02-18 LAB — COMPREHENSIVE METABOLIC PANEL
ALBUMIN: 3.9 g/dL (ref 3.5–5.2)
ALK PHOS: 78 U/L (ref 39–117)
ALT: 10 U/L (ref 0–35)
AST: 10 U/L (ref 0–37)
BILIRUBIN TOTAL: 0.4 mg/dL (ref 0.2–1.2)
BUN: 8 mg/dL (ref 6–23)
CALCIUM: 9.8 mg/dL (ref 8.4–10.5)
CO2: 28 mEq/L (ref 19–32)
Chloride: 102 mEq/L (ref 96–112)
Creatinine, Ser: 0.45 mg/dL (ref 0.40–1.20)
GFR: 177.51 mL/min (ref >60.00)
GLUCOSE: 91 mg/dL (ref 70–99)
POTASSIUM: 3.4 meq/L — AB (ref 3.5–5.1)
Sodium: 141 mEq/L (ref 135–145)
TOTAL PROTEIN: 8 g/dL (ref 6.0–8.3)

## 2017-02-18 LAB — CBC WITH DIFFERENTIAL/PLATELET
Basophils Absolute: 0 10*3/uL (ref 0.0–0.1)
Basophils Relative: 0.3 % (ref 0.0–3.0)
EOS PCT: 2.6 % (ref 0.0–5.0)
Eosinophils Absolute: 0.2 10*3/uL (ref 0.0–0.7)
HEMATOCRIT: 33.3 % — AB (ref 36.0–46.0)
Hemoglobin: 10.1 g/dL — ABNORMAL LOW (ref 12.0–15.0)
LYMPHS ABS: 1.7 10*3/uL (ref 0.7–4.0)
LYMPHS PCT: 22.6 % (ref 12.0–46.0)
MCHC: 30.5 g/dL (ref 30.0–36.0)
MCV: 71.3 fl — AB (ref 78.0–100.0)
Monocytes Absolute: 0.5 10*3/uL (ref 0.1–1.0)
Monocytes Relative: 7 % (ref 3.0–12.0)
NEUTROS PCT: 67.5 % (ref 43.0–77.0)
Neutro Abs: 5.1 10*3/uL (ref 1.4–7.7)
Platelets: 527 10*3/uL — ABNORMAL HIGH (ref 150.0–400.0)
RBC: 4.66 Mil/uL (ref 3.87–5.11)
RDW: 18.5 % — ABNORMAL HIGH (ref 11.5–15.5)
WBC: 7.6 10*3/uL (ref 4.0–10.5)

## 2017-02-18 LAB — TSH: TSH: 1.46 u[IU]/mL (ref 0.35–4.50)

## 2017-02-18 LAB — IBC PANEL
Iron: 17 ug/dL — ABNORMAL LOW (ref 42–145)
SATURATION RATIOS: 6 % — AB (ref 20.0–50.0)
Transferrin: 201 mg/dL — ABNORMAL LOW (ref 212.0–360.0)

## 2017-02-18 LAB — FERRITIN: Ferritin: 26.8 ng/mL (ref 10.0–291.0)

## 2017-02-18 NOTE — Telephone Encounter (Signed)
[facility] at [facility].   We did order the additional two lab tests, TSH and CMET.  Dr. Delbert HarnessKim Briscoe requested these two labs. Amy Esterwood PA agreed for the patient to have these labs along with the labs Amy ordered today.  Amy did not agree to order the Uric acid test.  I advised Tamika of this and she said that is fine and the patient can be tested for this at their office.

## 2017-02-18 NOTE — Patient Instructions (Addendum)
Please go to the basement level to have your labs drawn.   Continue Pepcid 40 mg daily. Continue Iron supplement 325 mg.   Our office will call you with the appointment [facility] at [facility].  If you are age 70 or older, your body mass index should be between 23-30. Your Body mass index is 29.05 kg/m. If this is out of the aforementioned range listed, please consider follow up with your Primary Care Provider.

## 2017-02-18 NOTE — Progress Notes (Addendum)
Subjective:    Patient ID: Dana Nelson, female    DOB: 26-Jul-1945, 72 y.o.   MRN: [id number]  HPI Dana KocherRegina is a pleasant 72 year old African-American female, known to Dr. Leone PayorGessner from hospitalization in October 2018 and comes in today for follow-up. Patient has history of hypertension, GERD, and adult onset diabetes mellitus. Apparently she was diagnosed with a DVT in May 2018 and was started on Xarelto. She became ill while she was in [place] taking care of her daughter who was sick and was hospitalized with GI bleeding in August 2018. We have obtained copies of her records and she had colonoscopy with polypectomy done in May 2018 for iron deficiency anemia. This was negative with the exception of a 5 mm polyp in the sigmoid colon and a few diverticuli. She then underwent EGD which showed gastritis of the antrum, no ulcer. Gastric biopsy showed benign gastric mucosa . Transverse colon biopsy showed benign colonic mucosa and sigmoid colon polyp was a tubular adenoma. Patient underwent capsule endoscopy in [place] as well which showed severe gastritis, duodenitis and multiple jejunal nonbleeding AVMs Procedures were done by a Dr. Su LeyNeal Patel and a Dr. Susa DayAyinala /[place]. Patient was then hospitalized towards the end of October 2018 here and she presented to the emergency room with hemoglobin of 4 in the setting of Xarelto use. She was having black stools at home but did not have any active bleeding after admitted to the hospital. Xarelto was held. She received blood transfusions and an iron infusion and hemoglobin was up to 9.9 on October 30. She underwent enteroscopy with Dr. Janne Napoleonana's which was normal, she was to follow-up in the office to decide if deep enteroscopy was indicated. She went back on her Xarelto after discharge from the hospital but that was stopped about 2 weeks ago after she had a follow-up Doppler that showed resolution of her DVT. Her last labs were done on  November 15 showing a hemoglobin of 7.9. Patient has been on oral iron once daily. She says she feels fine, has no GI complaints and says her stools have been brown and normal. She is very stressed because she says she has to be cleared from a GI standpoint and had to be cleared from a DVT standpoint prior to having a right hip replacement which she says she desperately needs. She says she's been told she needs bilateral hip replacements and bilateral knee replacements.    Review of Systems Pertinent positive and negative review of systems were noted in the above HPI section.  All other review of systems was otherwise negative.  Outpatient Encounter Medications as of 02/18/2017  Medication Sig  . allopurinol (ZYLOPRIM) 100 MG tablet Take 100 mg daily by mouth.   Marland Kitchen. aspirin EC 81 MG tablet Take 81 mg by mouth daily.  . diclofenac sodium (VOLTAREN) 1 % GEL Apply 2 g topically 4 (four) times daily.  . famotidine (PEPCID) 40 MG tablet Take 1 tablet (40 mg total) by mouth daily.  . ferrous sulfate 325 (65 FE) MG tablet Take 1 tablet (325 mg total) by mouth daily.  . hydrALAZINE (APRESOLINE) 25 MG tablet Take 25 mg by mouth 2 (two) times daily.   Marland Kitchen. levothyroxine (SYNTHROID, LEVOTHROID) 112 MCG tablet Take 112 mcg by mouth daily before breakfast.  . metFORMIN (GLUCOPHAGE-XR) 750 MG 24 hr tablet Take 750 mg by mouth 2 (two) times daily.  . metoprolol succinate (TOPROL-XL) 50 MG 24 hr tablet Take 50 mg by mouth every evening.  Take with or immediately following a meal.   . pantoprazole (PROTONIX) 40 MG tablet Take 40 mg by mouth daily.  . pravastatin (PRAVACHOL) 20 MG tablet Take 20 mg by mouth at bedtime.   . sucralfate (CARAFATE) 1 g tablet Take 1 g by mouth 2 (two) times daily.  . [DISCONTINUED] polyethylene glycol (MIRALAX / GLYCOLAX) packet Take 17 g by mouth daily as needed for mild constipation.  . [DISCONTINUED] rivaroxaban (XARELTO) 20 MG TABS tablet Take 20 mg by mouth daily.   No  facility-administered encounter medications on file as of 02/18/2017.    Allergies  Allergen Reactions  . Eggs Or Egg-Derived Products Other (See Comments)    Reported by [facility] 11/24/11 - unknown reaction  . Losartan Swelling    Swelling of lips and face  . Other Other (See Comments)    Hives and facial swelling from all nuts (tree nuts and peanuts)  . Peanut-Containing Drug Products Hives and Swelling    Facial swelling  . Penicillins Other (See Comments)    Childhood allergy- convulstions Has patient had a PCN reaction causing immediate rash, facial/tongue/throat swelling, SOB or lightheadedness with hypotension: Yes Has patient had a PCN reaction causing severe rash involving mucus membranes or skin necrosis: No Has patient had a PCN reaction that required hospitalization: Yes Has patient had a PCN reaction occurring within the last 10 years: No If all of the above answers are "NO", then may proceed with Cephalosporin use.   Patient Active Problem List   Diagnosis Date Noted  . Chronic anticoagulation 12/27/2016  . Hypomagnesemia 12/27/2016  . Diabetes mellitus without complication (HCC) 12/18/2016  . Osteoarthritis 12/18/2016  . Right knee pain 12/07/2016  . Right hip pain 12/07/2016  . AVM (arteriovenous malformation) of small bowel, acquired (HCC)   . Hypokalemia 12/04/2016  . Melena 12/04/2016  . GI bleed 12/04/2016  . Hematochezia   . Acute blood loss anemia   . Chronic iron deficiency anemia   . Angioedema 08/17/2016  . Blood loss anemia 08/17/2016  . DVT (deep venous thrombosis) (HCC) 08/17/2016  . HTN (hypertension) 08/17/2016  . Hyperlipidemia 08/17/2016  . Microcytic anemia 08/17/2016  . Hypothyroidism 08/17/2016  . GERD (gastroesophageal reflux disease) 08/17/2016   Social History   Socioeconomic History  . Marital status: Single    Spouse name: Not on file  . Number of children: 2  . Years of education: 36  . Highest education level: Not on  file  Social Needs  . Financial resource strain: Not on file  . Food insecurity - worry: Not on file  . Food insecurity - inability: Not on file  . Transportation needs - medical: Not on file  . Transportation needs - non-medical: Not on file  Occupational History  . Occupation: retired Child psychotherapist  Tobacco Use  . Smoking status: Never Smoker  . Smokeless tobacco: Never Used  Substance and Sexual Activity  . Alcohol use: No  . Drug use: No  . Sexual activity: Not on file  Other Topics Concern  . Not on file  Social History Narrative   HS graduate   Retired [employer]   Son lives [place]   Daughter in [place] - 4--# septic shock - deceased? - was in [place] caring for her   Admitted to [facility] 12/10/16   Never married   Never smoked   Alcohol none   Full Code    Ms. Crockett's family history  includes Asthma in her son; Dementia in her mother; Heart attack in her brother, father, and sister; Hypertension in her father and mother; Ovarian cancer in her sister.      Objective:    Vitals:   02/18/17 1039  BP: 140/80  Pulse: 82    Physical Exam  well-developed older African-American female in no acute distress, she is in a wheelchair, and accompanied by her sister blood pressure 140/80, pulse 82, height 5 foot 3, weight 164, BMI 29.0. HEENT; nontraumatic normocephalic EOMI PERRLA sclera anicteric, Cardiovascular; regular rate and rhythm with S1-S2 no murmur or gallop, Pulmonary ;clear bilaterally, Abdomen;; soft, nontender nondistended bowel sounds are active there is no palpable mass or hepatosplenomegaly bowel sounds are present, Rectal; exam not done, Extremities ;no clubbing cyanosis or edema skin warm and dry, Neuropsych; mood and affect appropriate       Assessment & Plan:   #64 72 year old African-American female with history of iron deficiency anemia and history of melena and profound anemia in setting of Xarelto use. She has been  previously documented on capsule endoscopy done in [place] August 2018 to have multiple nonbleeding jejunal AVMs. Recent EGD and colonoscopy May 2018 [place], no findings to explain anemia she did have an adenomatous polyp. Enteroscopy October 2018 here normal  Patient has not had any active bleeding since hospitalization in October 2018. She was recently taken off of Xarelto as she had completed a six-month course  #2 DVT May 2018 #3 adult-onset diabetes mellitus  #4 hypertension #5 diverticulosis #6 gastritis documented on EGD May 2018 #7 osteoarthritis bilateral hips and bilateral knees, patient is waiting to be scheduled for a right hip replacement. Needs clearance  Plan; patient will be a decrease risk of ongoing blood loss now that she is off of Xarelto Repeat CBC today and also check iron studies,CMET She will continue Pepcid 40 mg by mouth every morning She is no longer on pantoprazole or Carafate I believe she should be referred to [facility] for deep enteroscopy and ablation of AVMs. This would probably best be done prior to her undergoing any joint replacement surgeries and possibly requiring further anticoagulation. I will discuss with Dr. Leone Payor and we will arrange for referral.  Sammuel Cooper PA-C 02/18/2017   Cc: Macy Mis, MD   Agree with Ms. Oswald Hillock assessment and plan.  Makes sense to have balloon enteroscopy to getdeeper and try to ablate AVM's seen on capsule endoscopy in [place] Iva Boop, MD, [facility]

## 2017-03-21 HISTORY — PX: SMALL BOWEL ENTEROSCOPY: SHX2415

## 2017-04-04 ENCOUNTER — Other Ambulatory Visit: Payer: Self-pay | Admitting: Internal Medicine

## 2017-04-08 ENCOUNTER — Other Ambulatory Visit (INDEPENDENT_AMBULATORY_CARE_PROVIDER_SITE_OTHER): Payer: Self-pay

## 2017-04-08 ENCOUNTER — Encounter (INDEPENDENT_AMBULATORY_CARE_PROVIDER_SITE_OTHER): Payer: Self-pay | Admitting: Orthopaedic Surgery

## 2017-04-08 ENCOUNTER — Ambulatory Visit (INDEPENDENT_AMBULATORY_CARE_PROVIDER_SITE_OTHER): Payer: Medicare Other | Admitting: Orthopaedic Surgery

## 2017-04-08 VITALS — BP 160/95 | HR 74 | Resp 16 | Ht 62.0 in | Wt 167.0 lb

## 2017-04-08 DIAGNOSIS — M160 Bilateral primary osteoarthritis of hip: Secondary | ICD-10-CM | POA: Insufficient documentation

## 2017-04-08 NOTE — Progress Notes (Signed)
Office Visit Note   Patient: Dana Nelson           Date of Birth: [birth year]           MRN: [id number] Visit Date: 04/08/2017              Requested by: Macy MisBriscoe, Kim K, MD [id number] [place], [place] [address] PCP: Macy MisBriscoe, Kim K, MD   Assessment & Plan: Visit Diagnoses:  1. Bilateral primary osteoarthritis of hip     Plan: End-stage osteoarthritis both hips. She is more symptomatic on the right. We've had a long discussion in the past regarding her problem and treatment options. I'd like Dr. Magnus IvanBlackman to evaluate Dana Nelson is a think she might be a better candidate for an anterior approach to the hip for hip replacement. He can also discussed either staged hip replacement or at one setting. Long discussion with Dana Nelson and her son regarding the above. We'll make an appointment for her to see Dr. Magnus IvanBlackman  Follow-Up Instructions: Return if symptoms worsen or fail to improve.   Orders:  No orders of the defined types were placed in this encounter.  No orders of the defined types were placed in this encounter.     Procedures: No procedures performed   Clinical Data: No additional findings.   Subjective: Chief Complaint  Patient presents with  . Right Hip - Edema, Pain    Dana Nelson is a 72 y o here to discuss possible Right hip surgery. She relates she had her intestinal surgery and now wants hip done  I have seen Dana Nelson on several occasions regarding arthritis of her knees and her hips. She is more symptomatic with her hips to the point of compromise. She does ambulate with a rolling walker. She is having more trouble on the right than the left but both hips are "uncomfortable. Prior films demonstrate end-stage osteoarthritis with possible avascular necrosis. There is little if any joint space remaining.. There appears to be  diffuse bony demineralization.  HPI  Review of Systems  Constitutional: Negative for chills, fatigue and fever.    HENT: Negative for hearing loss and tinnitus.   Eyes: Negative for itching.  Respiratory: Negative for chest tightness and shortness of breath.   Cardiovascular: Negative for chest pain, palpitations and leg swelling.  Gastrointestinal: Negative for blood in stool, constipation and diarrhea.  Endocrine: Negative for polyuria.  Genitourinary: Negative for dysuria.  Musculoskeletal: Negative for back pain, joint swelling, neck pain and neck stiffness.  Allergic/Immunologic: Negative for immunocompromised state.  Neurological: Negative for dizziness, numbness and headaches.  Hematological: Does not bruise/bleed easily.  Psychiatric/Behavioral: Negative for sleep disturbance. The patient is not nervous/anxious.      Objective: Vital Signs: BP (!) 160/95   Pulse 74   Resp 16   Ht 5\' 2"  (1.575 m)   Wt 167 lb (75.8 kg)   BMI 30.54 kg/m   Physical Exam  Ortho Exam awake alert and oriented 3. Comfortable sitting. She actually sitting in a rolling walker. Does have large thighs. Significant pain with internal/external rotation of both hips but more on the right than left. Does have some loss of internal/external rotation and again more on the right than the left. No pain with flexion. No swelling distally. Neurologically appears to be intact. Straight leg raise negative.  Specialty Comments:  No specialty comments available.  Imaging: No results found.   PMFS History: Patient Active Problem List   Diagnosis Date  Noted  . Bilateral primary osteoarthritis of hip 04/08/2017  . Chronic anticoagulation 12/27/2016  . Hypomagnesemia 12/27/2016  . Diabetes mellitus without complication (HCC) 12/18/2016  . Osteoarthritis 12/18/2016  . Right knee pain 12/07/2016  . Right hip pain 12/07/2016  . AVM (arteriovenous malformation) of small bowel, acquired (HCC)   . Hypokalemia 12/04/2016  . Melena 12/04/2016  . GI bleed 12/04/2016  . Hematochezia   . Acute blood loss anemia   . Chronic  iron deficiency anemia   . Angioedema 08/17/2016  . Blood loss anemia 08/17/2016  . DVT (deep venous thrombosis) (HCC) 08/17/2016  . HTN (hypertension) 08/17/2016  . Hyperlipidemia 08/17/2016  . Microcytic anemia 08/17/2016  . Hypothyroidism 08/17/2016  . GERD (gastroesophageal reflux disease) 08/17/2016   Past Medical History:  Diagnosis Date  . Acute blood loss anemia   . Angioedema 2018  . Asthma   . AVM (arteriovenous malformation) of small bowel, acquired (HCC)    Jejunum - capsule endo 09/2016  . Diabetes mellitus without complication (HCC)   . DVT (deep venous thrombosis) (HCC) 06/2016  . Eczema   . GERD (gastroesophageal reflux disease) 08/17/2016  . GI bleed 12/04/2016  . Gout   . Hyperlipemia   . Hypertension   . Hypothyroidism   . Iron deficiency anemia   . Lip swelling 08/2016  . Melena 12/04/2016  . Open-angle glaucoma   . Osteoarthritis   . Right hip pain 12/07/2016  . Right knee pain 12/07/2016  . Vitamin D deficiency     Family History  Problem Relation Age of Onset  . Dementia Mother        died at 66  . Hypertension Mother   . Heart attack Father        died 31  . Hypertension Father   . Ovarian cancer Sister   . Heart attack Brother   . Asthma Son   . Heart attack Sister     Past Surgical History:  Procedure Laterality Date  . ABDOMINAL HYSTERECTOMY    . [place]OS[place] one [place]  . ENTEROSCOPY N/A 12/06/2016   Procedure: ENTEROSCOPY;  Surgeon: Sherrilyn Rist, MD;  Location: [facility] ENDOSCOPY;  Service: Gastroenterology;  Laterality: N/A;  . ESOPHAGOGASTRODUODENOSCOPY  2018   [place] - gastritis  . GIVENS CAPSULE STUDY  2018   [place] - jejunal AVM's, gastritis 2h 6 min small bowel passage, AVM first at 1 hr post duodenum  . SMALL BOWEL ENTEROSCOPY  03/21/2017  . THYROIDECTOMY     Social History   Occupational History  . Occupation: retired Child psychotherapist  Tobacco Use  . Smoking status: Never Smoker  . Smokeless tobacco: Never Used    Substance and Sexual Activity  . Alcohol use: No  . Drug use: No  . Sexual activity: Not on file

## 2017-04-12 ENCOUNTER — Other Ambulatory Visit: Payer: Self-pay | Admitting: Internal Medicine

## 2017-04-19 ENCOUNTER — Encounter (INDEPENDENT_AMBULATORY_CARE_PROVIDER_SITE_OTHER): Payer: Self-pay | Admitting: Orthopaedic Surgery

## 2017-04-19 ENCOUNTER — Ambulatory Visit (INDEPENDENT_AMBULATORY_CARE_PROVIDER_SITE_OTHER): Payer: Medicare Other | Admitting: Orthopaedic Surgery

## 2017-04-19 DIAGNOSIS — M1611 Unilateral primary osteoarthritis, right hip: Secondary | ICD-10-CM | POA: Diagnosis not present

## 2017-04-19 DIAGNOSIS — M1612 Unilateral primary osteoarthritis, left hip: Secondary | ICD-10-CM | POA: Insufficient documentation

## 2017-04-19 NOTE — Progress Notes (Signed)
Office Visit Note   Patient: Dana EpleyRegina Nelson           Date of Birth: 05/22/1945           MRN: [id number] Visit Date: 04/19/2017              Requested by: Macy MisBriscoe, Kim K, MD [address] [place], [place] [address] PCP: Macy MisBriscoe, Kim K, MD   Assessment & Plan: Visit Diagnoses:  1. Unilateral primary osteoarthritis, left hip   2. Unilateral primary osteoarthritis, right hip     Plan: I went over x-rays with her and her family in detail and we talked about hip replacement surgery we had a long and thorough discussion about the risk and benefits of the surgery.  I do not feel that she is a candidate for having both hips done at once given her comorbidities as well as her anemia.  They understand this and she does wish to proceed with release of her right total hip arthroplasty in the near future and then address her other joints accordingly.  All questions concerns were answered and addressed.  We will work on getting the surgery scheduled for her future.  Follow-Up Instructions: Return for 2 weeks post-op.   Orders:  No orders of the defined types were placed in this encounter.  No orders of the defined types were placed in this encounter.     Procedures: No procedures performed   Clinical Data: No additional findings.   Subjective: Chief Complaint  Patient presents with  . Right Hip - Pain  . Left Hip - Pain  The patient is a very pleasant 72 year old female to my partner Dr. Cleophas DunkerWhitfield wanted me to see to consider anterior hip replacement surgery on potentially both hips due to severe arthritis in both hips.  She also has severe arthritis in both knees as well as at least her left shoulder.  She is mainly in a wheelchair and does not walk well at all.  She says her most severe pain comes from her right hip.  Upon review of her medical records she is a diabetic but under excellent control.  She is also somewhat has been dealing with chronic anemia and actually  had transfusions late last year due to what she said was blood clots.  Certainly that makes this concerning for replacing 2 joints at once and I expressed that to her in detail.  Her pain is daily and it is 10 out of 10.  It is detrimental effect to correct his daily living, quality of life, mobility.  She mainly gets around between a wheelchair and a walker.  Family is with her today.  She does not have great family support though at home in terms of postoperative likely needing skilled nursing.  HPI  Review of Systems She currently denies any headache, chest pain, shortness of breath, fever, chills, nausea, vomiting.  Objective: Vital Signs: There were no vitals taken for this visit.  Physical Exam She is alert and oriented x3 in no acute distress Ortho Exam Examination of both hips shows significant pain with any attempts of internal or external rotation.  Her right hip is severely limited in terms of rotation.  Both knees have good range of motion but are significantly painful to her.  Her left shoulder has limited motion is significantly painful. Specialty Comments:  No specialty comments available.  Imaging: No results found. Independent review of x-rays involving both hips as well as her left shoulder  and right knee all show severe end-stage arthritis.  Her right hip is the worst.  There is almost collapse of the femoral head with the femoral head divided into the acetabular rim.  There are cystic changes as well as sclerotic changes in particular osteophytes.  There is essentially no joint space left.  PMFS History: Patient Active Problem List   Diagnosis Date Noted  . Unilateral primary osteoarthritis, right hip 04/19/2017  . Unilateral primary osteoarthritis, left hip 04/19/2017  . Bilateral primary osteoarthritis of hip 04/08/2017  . Chronic anticoagulation 12/27/2016  . Hypomagnesemia 12/27/2016  . Diabetes mellitus without complication (HCC) 12/18/2016  . Osteoarthritis  12/18/2016  . Right knee pain 12/07/2016  . Right hip pain 12/07/2016  . AVM (arteriovenous malformation) of small bowel, acquired (HCC)   . Hypokalemia 12/04/2016  . Melena 12/04/2016  . GI bleed 12/04/2016  . Hematochezia   . Acute blood loss anemia   . Chronic iron deficiency anemia   . Angioedema 08/17/2016  . Blood loss anemia 08/17/2016  . DVT (deep venous thrombosis) (HCC) 08/17/2016  . HTN (hypertension) 08/17/2016  . Hyperlipidemia 08/17/2016  . Microcytic anemia 08/17/2016  . Hypothyroidism 08/17/2016  . GERD (gastroesophageal reflux disease) 08/17/2016   Past Medical History:  Diagnosis Date  . Acute blood loss anemia   . Angioedema 2018  . Asthma   . AVM (arteriovenous malformation) of small bowel, acquired (HCC)    Jejunum - capsule endo 09/2016  . Diabetes mellitus without complication (HCC)   . DVT (deep venous thrombosis) (HCC) 06/2016  . Eczema   . GERD (gastroesophageal reflux disease) 08/17/2016  . GI bleed 12/04/2016  . Gout   . Hyperlipemia   . Hypertension   . Hypothyroidism   . Iron deficiency anemia   . Lip swelling 08/2016  . Melena 12/04/2016  . Open-angle glaucoma   . Osteoarthritis   . Right hip pain 12/07/2016  . Right knee pain 12/07/2016  . Vitamin D deficiency     Family History  Problem Relation Age of Onset  . Dementia Mother        died at 59  . Hypertension Mother   . Heart attack Father        died 98  . Hypertension Father   . Ovarian cancer Sister   . Heart attack Brother   . Asthma Son   . Heart attack Sister     Past Surgical History:  Procedure Laterality Date  . ABDOMINAL HYSTERECTOMY    . COLONOSCOPY  [place] one polyp  . ENTEROSCOPY N/A 12/06/2016   Procedure: ENTEROSCOPY;  Surgeon: Sherrilyn Rist, MD;  Location: [facility] ENDOSCOPY;  Service: Gastroenterology;  Laterality: N/A;  . ESOPHAGOGASTRODUODENOSCOPY  2018   [place] - gastritis  . GIVENS CAPSULE STUDY  2018   [place] - jejunal AVM's, gastritis 2h 6 min small  bowel passage, AVM first at 1 hr post duodenum  . SMALL BOWEL ENTEROSCOPY  03/21/2017  . THYROIDECTOMY     Social History   Occupational History  . Occupation: retired Child psychotherapist  Tobacco Use  . Smoking status: Never Smoker  . Smokeless tobacco: Never Used  Substance and Sexual Activity  . Alcohol use: No  . Drug use: No  . Sexual activity: Not on file

## 2017-05-12 ENCOUNTER — Other Ambulatory Visit (INDEPENDENT_AMBULATORY_CARE_PROVIDER_SITE_OTHER): Payer: Self-pay | Admitting: Physician Assistant

## 2017-05-12 NOTE — Pre-Procedure Instructions (Signed)
Dana Nelson  05/12/2017      [id number] - [place], [place] - [address] AT [facility] OF [address] [place] [address] Phone: [phone] Fax: [phone]    Your procedure is scheduled on April 16  Report to [facility] Admitting at 1030 A.M.  Call this number if you have problems the morning of surgery:  [phone]   Remember:  Do not eat food or drink liquids after midnight.  Take these medicines the morning of surgery with A SIP OF WATER  Allopurinol (Zyloprim) Amlodipine (Norvasc) Hydralazine (Apresoline) Levothyroxine (Synthroid) Metoprolol succinate (Toprol-XL) Ventolin inhaler if needed- bring with you on the day of surgery  Stop taking aspirin as directed by your Dr.  Stop taking BC's, Goody's, Herbal medications, Fish Oil, Vitamins, Aleve, Ibuprofen, Advil, Motrin    How to Manage Your Diabetes Before and After Surgery  Why is it important to control my blood sugar before and after surgery? . Improving blood sugar levels before and after surgery helps healing and can limit problems. . A way of improving blood sugar control is eating a healthy diet by: o  Eating less sugar and carbohydrates o  Increasing activity/exercise o  Talking with your doctor about reaching your blood sugar goals . High blood sugars (greater than 180 mg/dL) can raise your risk of infections and slow your recovery, so you will need to focus on controlling your diabetes during the weeks before surgery. . Make sure that the doctor who takes care of your diabetes knows about your planned surgery including the date and location.  How do I manage my blood sugar before surgery? . Check your blood sugar at least 4 times a day, starting 2 days before surgery, to make sure that the level is not too high or low. o Check your blood sugar the morning of your surgery when you wake up and every 2 hours until you get to the Short Stay  unit. . If your blood sugar is less than 70 mg/dL, you will need to treat for low blood sugar: o Do not take insulin. o Treat a low blood sugar (less than 70 mg/dL) with  cup of clear juice (cranberry or apple), 4 glucose tablets, OR glucose gel. Recheck blood sugar in 15 minutes after treatment (to make sure it is greater than 70 mg/dL). If your blood sugar is not greater than 70 mg/dL on recheck, call [phone] o  for further instructions. . Report your blood sugar to the short stay nurse when you get to Short Stay.  . If you are admitted to the hospital after surgery: o Your blood sugar will be checked by the staff and you will probably be given insulin after surgery (instead of oral diabetes medicines) to make sure you have good blood sugar levels. o The goal for blood sugar control after surgery is 80-180 mg/dL.              WHAT DO I DO ABOUT MY DIABETES MEDICATION?   Marland Kitchen Do not take oral diabetes medicines (pills) the morning of surgery. Metformin (Glucophage-XR)  Patient Signature:  Date:   Nurse Signature:  Date:   Reviewed and Endorsed by [facility] Patient Education Committee, August 2015  Do not wear jewelry, make-up or nail polish.  Do not wear lotions, powders, or perfumes, or deodorant.  Do not shave 48 hours prior to surgery.  Men may shave face and neck.  Do not bring valuables to the hospital.  [facility] is not responsible for any belongings or valuables.  Contacts, dentures or bridgework may not be worn into surgery.  Leave your suitcase in the car.  After surgery it may be brought to your room.  For patients admitted to the hospital, discharge time will be determined by your treatment team.  Patients discharged the day of surgery will not be allowed to drive home.   Special instructions:   [facility]- Preparing For Surgery  Before surgery, you can play an important role. Because skin is not sterile, your skin needs to be as free of germs as  possible. You can reduce the number of germs on your skin by washing with CHG (chlorahexidine gluconate) Soap before surgery.  CHG is an antiseptic cleaner which kills germs and bonds with the skin to continue killing germs even after washing.  Please do not use if you have an allergy to CHG or antibacterial soaps. If your skin becomes reddened/irritated stop using the CHG.  Do not shave (including legs and underarms) for at least 48 hours prior to first CHG shower. It is OK to shave your face.  Please follow these instructions carefully.   1. Shower the NIGHT BEFORE SURGERY and the MORNING OF SURGERY with CHG.   2. If you chose to wash your hair, wash your hair first as usual with your normal shampoo.  3. After you shampoo, rinse your hair and body thoroughly to remove the shampoo.  4. Use CHG as you would any other liquid soap. You can apply CHG directly to the skin and wash gently with a scrungie or a clean washcloth.   5. Apply the CHG Soap to your body ONLY FROM THE NECK DOWN.  Do not use on open wounds or open sores. Avoid contact with your eyes, ears, mouth and genitals (private parts). Wash Face and genitals (private parts)  with your normal soap.  6. Wash thoroughly, paying special attention to the area where your surgery will be performed.  7. Thoroughly rinse your body with warm water from the neck down.  8. DO NOT shower/wash with your normal soap after using and rinsing off the CHG Soap.  9. Pat yourself dry with a CLEAN TOWEL.  10. Wear CLEAN PAJAMAS to bed the night before surgery, wear comfortable clothes the morning of surgery  11. Place CLEAN SHEETS on your bed the night of your first shower and DO NOT SLEEP WITH PETS.    Day of Surgery: Do not apply any deodorants/lotions. Please wear clean clothes to the hospital/surgery center.      Please read over the following fact sheets that you were given. Pain Booklet, Coughing and Deep Breathing, MRSA Information and  Surgical Site Infection Prevention

## 2017-05-13 ENCOUNTER — Encounter (HOSPITAL_COMMUNITY)
Admission: RE | Admit: 2017-05-13 | Discharge: 2017-05-13 | Disposition: A | Discharge status: Home / Self Care | Payer: Medicare Other | Source: Ambulatory Visit | Attending: Orthopaedic Surgery | Admitting: Orthopaedic Surgery

## 2017-05-13 ENCOUNTER — Other Ambulatory Visit: Payer: Self-pay

## 2017-05-13 ENCOUNTER — Encounter (HOSPITAL_COMMUNITY): Payer: Self-pay

## 2017-05-13 DIAGNOSIS — K219 Gastro-esophageal reflux disease without esophagitis: Secondary | ICD-10-CM | POA: Insufficient documentation

## 2017-05-13 DIAGNOSIS — D509 Iron deficiency anemia, unspecified: Secondary | ICD-10-CM | POA: Insufficient documentation

## 2017-05-13 DIAGNOSIS — R7303 Prediabetes: Secondary | ICD-10-CM | POA: Insufficient documentation

## 2017-05-13 DIAGNOSIS — E785 Hyperlipidemia, unspecified: Secondary | ICD-10-CM | POA: Diagnosis not present

## 2017-05-13 DIAGNOSIS — Z01812 Encounter for preprocedural laboratory examination: Secondary | ICD-10-CM | POA: Insufficient documentation

## 2017-05-13 DIAGNOSIS — J45909 Unspecified asthma, uncomplicated: Secondary | ICD-10-CM | POA: Insufficient documentation

## 2017-05-13 DIAGNOSIS — I10 Essential (primary) hypertension: Secondary | ICD-10-CM | POA: Insufficient documentation

## 2017-05-13 DIAGNOSIS — Z86718 Personal history of other venous thrombosis and embolism: Secondary | ICD-10-CM | POA: Insufficient documentation

## 2017-05-13 DIAGNOSIS — E039 Hypothyroidism, unspecified: Secondary | ICD-10-CM | POA: Insufficient documentation

## 2017-05-13 HISTORY — DX: Prediabetes: R73.03

## 2017-05-13 HISTORY — DX: Personal history of other medical treatment: Z92.89

## 2017-05-13 HISTORY — DX: Dyspnea, unspecified: R06.00

## 2017-05-13 LAB — CBC
HEMATOCRIT: 30.6 % — AB (ref 36.0–46.0)
HEMOGLOBIN: 9.1 g/dL — AB (ref 12.0–15.0)
MCH: 20.7 pg — ABNORMAL LOW (ref 26.0–34.0)
MCHC: 29.7 g/dL — AB (ref 30.0–36.0)
MCV: 69.7 fL — ABNORMAL LOW (ref 78.0–100.0)
Platelets: 430 10*3/uL — ABNORMAL HIGH (ref 150–400)
RBC: 4.39 MIL/uL (ref 3.87–5.11)
RDW: 20.8 % — AB (ref 11.5–15.5)
WBC: 7 10*3/uL (ref 4.0–10.5)

## 2017-05-13 LAB — HEMOGLOBIN A1C
HEMOGLOBIN A1C: 5.3 % (ref 4.8–5.6)
MEAN PLASMA GLUCOSE: 105.41 mg/dL

## 2017-05-13 LAB — BASIC METABOLIC PANEL
ANION GAP: 10 (ref 5–15)
BUN: 8 mg/dL (ref 6–20)
CALCIUM: 9.7 mg/dL (ref 8.9–10.3)
CO2: 23 mmol/L (ref 22–32)
Chloride: 104 mmol/L (ref 101–111)
Creatinine, Ser: 0.46 mg/dL (ref 0.44–1.00)
GFR calc Af Amer: >60 mL/min (ref >60)
GLUCOSE: 97 mg/dL (ref 65–99)
POTASSIUM: 3.8 mmol/L (ref 3.5–5.1)
SODIUM: 137 mmol/L (ref 135–145)

## 2017-05-13 LAB — SURGICAL PCR SCREEN
MRSA, PCR: NEGATIVE
STAPHYLOCOCCUS AUREUS: NEGATIVE

## 2017-05-13 LAB — GLUCOSE, CAPILLARY: Glucose-Capillary: 96 mg/dL (ref 65–99)

## 2017-05-13 NOTE — Pre-Procedure Instructions (Signed)
Dana Nelson  05/13/2017       Your procedure is scheduled on April 16  Report to [facility] Admitting at 1030 A.M.                  Your surgery or procedure is scheduled for  12:30 PM    Call this number if you have problems the morning of [phone]- pre- op desk                For any other questions, please call [phone], Monday - Friday 8 AM - 4 PM.  Remember:  Do not eat food or drink liquids after midnight Monday, April 15.  Take these medicines the morning of surgery with A SIP OF WATER : Allopurinol (Zyloprim) Amlodipine (Norvasc) Hydralazine (Apresoline) Levothyroxine (Synthroid) Metoprolol succinate (Toprol-XL) Ventolin inhaler if needed- bring with you on the day of surgery  Do NOT take Metformin the morning of surgery  Stop taking aspirin as directed by your Dr.  Stop taking BC's, Goody's, Herbal medications, Fish Oil, Vitamins, Aleve, Ibuprofen, Advil, Motrin    How to Manage Your Diabetes Before and After Surgery  Why is it important to control my blood sugar before and after surgery? . Improving blood sugar levels before and after surgery helps healing and can limit problems. . A way of improving blood sugar control is eating a healthy diet by: o  Eating less sugar and carbohydrates o  Increasing activity/exercise o  Talking with your doctor about reaching your blood sugar goals . High blood sugars (greater than 180 mg/dL) can raise your risk of infections and slow your recovery, so you will need to focus on controlling your diabetes during the weeks before surgery. . Make sure that the doctor who takes care of your diabetes knows about your planned surgery including the date and location.  How do I manage my blood sugar before surgery? . Check your blood sugar at least 4 times a day, starting 2 days before surgery, to make sure that the level is not too high or low. o Check your blood sugar the morning of your surgery when you  wake up and every 2 hours until you get to the Short Stay unit. . If your blood sugar is less than 70 mg/dL, you will need to treat for low blood sugar: o Do not take insulin. o Treat a low blood sugar (less than 70 mg/dL) with  cup of clear juice (cranberry or apple), 4 glucose tablets, OR glucose gel. Recheck blood sugar in 15 minutes after treatment (to make sure it is greater than 70 mg/dL). If your blood sugar is not greater than 70 mg/dL on recheck, call [phone] o  for further instructions. . Report your blood sugar to the short stay nurse when you get to Short Stay.  . If you are admitted to the hospital after surgery: o Your blood sugar will be checked by the staff and you will probably be given insulin after surgery (instead of oral diabetes medicines) to make sure you have good blood sugar levels. o The goal for blood sugar control after surgery is 80-180 mg/dL.   WHAT DO I DO ABOUT MY DIABETES MEDICATION?   Marland Kitchen Do not take oral diabetes medicines (pills) the morning of surgery. Metformin (Glucophage-XR)  Patient Signature:  Date:   Nurse Signature:  Date:   Reviewed and Endorsed by [facility] Patient Education Committee, August 2015   Do not wear jewelry, make-up  or nail polish.  Do not wear lotions, powders, or perfumes, or deodorant.  Do not shave 48 hours prior to surgery.  Men may shave face and neck.  Do not bring valuables to the hospital.  [facility] is not responsible for any belongings or valuables.  Contacts, dentures or bridgework may not be worn into surgery.  Leave your suitcase in the car.  After surgery it may be brought to your room.  For patients admitted to the hospital, discharge time will be determined by your treatment team.  Patients discharged the day of surgery will not be allowed to drive home.   Please read over the following fact sheets that you were given. Pain Booklet, Coughing and Deep Breathing, MRSA Information and Surgical Site  Infection Prevention

## 2017-05-13 NOTE — Progress Notes (Signed)
Ms Dana Nelson has a 3 in 1 chair and a walker from rehab in October in 2018.  I called Sherrie at Dr Magnus IvanBlackman and informed her.

## 2017-05-17 NOTE — Progress Notes (Addendum)
Anesthesia Chart Review:  Pt is a 72 year old female scheduled for right total hip arthroplasty anterior approach on 05/24/2017 with Doneen Poissonhristopher Blackman, MD  - PCP is Delbert HarnessKim Briscoe, MD (notes in care everywhere)   PMH includes: HTN, pre-diabetes, hyperlipidemia, DVT (06/2016), iron deficiency anemia, hypothyroidism, AVM of small bowel, asthma, GERD.  Never smoker.  BMI 29.  Medications include: Amlodipine, ASA 81 mg, iron, hydralazine, levothyroxine, metformin, metoprolol, pravastatin, albuterol  BP (!) 177/98   Pulse 73   Temp 36.8 C   Resp 20   Ht 5\' 3"  (1.6 m)   Wt 165 lb 4.8 oz (75 kg)   SpO2 100%   BMI 29.28 kg/m  - BP not rechecked at pre-admission testing. Prior BP readings at PCP's office:  140/72 04/13/17 142/80 02/25/17   Preoperative labs reviewed.   - HbA1c 5.3, glucose 97 - H/H 9.1/30.6. Hgb was 10.1 on 02/18/17. Prior results from last November ranged 7.9-9.7. Pt with known hx iron deficiency anemia. Dr. Magnus IvanBlackman has reviewed labs, and T&S was done at pre-admission testing. I will route our lab results to PCP for f/u.  1 view CXR 12/04/16: No acute cardiopulmonary disease.  EKG 12/04/16: Sinus tachycardia (109 bpm). Left axis deviation. RBBB  If BP acceptable day of surgery, I anticipate pt can proceed with surgery as scheduled.   Rica Mastngela Yazmyn Valbuena, FNP-BC [facility]Anesthesiology Phone: [phone] 05/17/2017 12:25 PM

## 2017-05-24 ENCOUNTER — Inpatient Hospital Stay (HOSPITAL_COMMUNITY): Payer: Medicare Other | Admitting: Emergency Medicine

## 2017-05-24 ENCOUNTER — Encounter (HOSPITAL_COMMUNITY)
Admission: RE | Disposition: A | Discharge status: Skilled Nursing Facility | Payer: Self-pay | Source: Ambulatory Visit | Attending: Orthopaedic Surgery

## 2017-05-24 ENCOUNTER — Inpatient Hospital Stay (HOSPITAL_COMMUNITY): Payer: Medicare Other

## 2017-05-24 ENCOUNTER — Inpatient Hospital Stay (HOSPITAL_COMMUNITY)
Admission: RE | Admit: 2017-05-24 | Discharge: 2017-05-27 | DRG: 470 | Disposition: A | Discharge status: Skilled Nursing Facility | Payer: Medicare Other | Source: Ambulatory Visit | Attending: Orthopaedic Surgery | Admitting: Orthopaedic Surgery

## 2017-05-24 ENCOUNTER — Inpatient Hospital Stay (HOSPITAL_COMMUNITY): Payer: Medicare Other | Admitting: Certified Registered Nurse Anesthetist

## 2017-05-24 ENCOUNTER — Encounter (HOSPITAL_COMMUNITY): Payer: Self-pay | Admitting: *Deleted

## 2017-05-24 DIAGNOSIS — Z91018 Allergy to other foods: Secondary | ICD-10-CM | POA: Diagnosis not present

## 2017-05-24 DIAGNOSIS — M160 Bilateral primary osteoarthritis of hip: Secondary | ICD-10-CM | POA: Diagnosis present

## 2017-05-24 DIAGNOSIS — J45909 Unspecified asthma, uncomplicated: Secondary | ICD-10-CM | POA: Diagnosis present

## 2017-05-24 DIAGNOSIS — E890 Postprocedural hypothyroidism: Secondary | ICD-10-CM | POA: Diagnosis present

## 2017-05-24 DIAGNOSIS — Z7982 Long term (current) use of aspirin: Secondary | ICD-10-CM

## 2017-05-24 DIAGNOSIS — Z888 Allergy status to other drugs, medicaments and biological substances status: Secondary | ICD-10-CM | POA: Diagnosis not present

## 2017-05-24 DIAGNOSIS — Z880 Allergy status to penicillin: Secondary | ICD-10-CM

## 2017-05-24 DIAGNOSIS — Z91010 Allergy to peanuts: Secondary | ICD-10-CM | POA: Diagnosis not present

## 2017-05-24 DIAGNOSIS — Z96641 Presence of right artificial hip joint: Secondary | ICD-10-CM

## 2017-05-24 DIAGNOSIS — Z79899 Other long term (current) drug therapy: Secondary | ICD-10-CM | POA: Diagnosis not present

## 2017-05-24 DIAGNOSIS — Z86718 Personal history of other venous thrombosis and embolism: Secondary | ICD-10-CM

## 2017-05-24 DIAGNOSIS — E119 Type 2 diabetes mellitus without complications: Secondary | ICD-10-CM | POA: Diagnosis present

## 2017-05-24 DIAGNOSIS — K219 Gastro-esophageal reflux disease without esophagitis: Secondary | ICD-10-CM | POA: Diagnosis present

## 2017-05-24 DIAGNOSIS — Z7984 Long term (current) use of oral hypoglycemic drugs: Secondary | ICD-10-CM | POA: Diagnosis not present

## 2017-05-24 DIAGNOSIS — M109 Gout, unspecified: Secondary | ICD-10-CM | POA: Diagnosis present

## 2017-05-24 DIAGNOSIS — M1611 Unilateral primary osteoarthritis, right hip: Secondary | ICD-10-CM

## 2017-05-24 DIAGNOSIS — Z419 Encounter for procedure for purposes other than remedying health state, unspecified: Secondary | ICD-10-CM

## 2017-05-24 DIAGNOSIS — I10 Essential (primary) hypertension: Secondary | ICD-10-CM | POA: Diagnosis present

## 2017-05-24 DIAGNOSIS — H4010X0 Unspecified open-angle glaucoma, stage unspecified: Secondary | ICD-10-CM | POA: Diagnosis present

## 2017-05-24 DIAGNOSIS — Z91012 Allergy to eggs: Secondary | ICD-10-CM

## 2017-05-24 DIAGNOSIS — D62 Acute posthemorrhagic anemia: Secondary | ICD-10-CM | POA: Diagnosis not present

## 2017-05-24 DIAGNOSIS — E785 Hyperlipidemia, unspecified: Secondary | ICD-10-CM | POA: Diagnosis present

## 2017-05-24 HISTORY — PX: TOTAL HIP ARTHROPLASTY: SHX124

## 2017-05-24 LAB — GLUCOSE, CAPILLARY
Glucose-Capillary: 83 mg/dL (ref 65–99)
Glucose-Capillary: 77 mg/dL (ref 65–99)

## 2017-05-24 SURGERY — ARTHROPLASTY, HIP, TOTAL, ANTERIOR APPROACH
Anesthesia: Spinal | Laterality: Right

## 2017-05-24 MED ORDER — MENTHOL 3 MG MT LOZG: 1.0000 | LOZENGE | OROMUCOSAL | Status: DC | PRN

## 2017-05-24 MED ORDER — METHOCARBAMOL 500 MG PO TABS
500.0000 mg | ORAL_TABLET | Freq: Four times a day (QID) | ORAL | Status: DC | PRN
  Administered 2017-05-24 – 2017-05-25 (×2): 500 mg via ORAL
  Filled 2017-05-24: qty 1

## 2017-05-24 MED ORDER — HYDROMORPHONE HCL 2 MG/ML IJ SOLN
0.5000 mg | INTRAMUSCULAR | Status: DC | PRN
  Administered 2017-05-24 – 2017-05-25 (×2): 1 mg via INTRAVENOUS
  Filled 2017-05-24 (×2): qty 1

## 2017-05-24 MED ORDER — DEXAMETHASONE SODIUM PHOSPHATE 10 MG/ML IJ SOLN
INTRAMUSCULAR | Status: DC | PRN
  Administered 2017-05-24: 5 mg via INTRAVENOUS

## 2017-05-24 MED ORDER — PROPOFOL 10 MG/ML IV BOLUS
INTRAVENOUS | Status: AC
  Filled 2017-05-24: qty 20

## 2017-05-24 MED ORDER — DOCUSATE SODIUM 100 MG PO CAPS
100.0000 mg | ORAL_CAPSULE | Freq: Two times a day (BID) | ORAL | Status: DC
  Administered 2017-05-25 – 2017-05-27 (×4): 100 mg via ORAL
  Filled 2017-05-24 (×7): qty 1

## 2017-05-24 MED ORDER — METOCLOPRAMIDE HCL 5 MG/ML IJ SOLN: 5.0000 mg | Freq: Three times a day (TID) | INTRAMUSCULAR | Status: DC | PRN

## 2017-05-24 MED ORDER — ALBUTEROL SULFATE (2.5 MG/3ML) 0.083% IN NEBU: 3.0000 mL | INHALATION_SOLUTION | Freq: Four times a day (QID) | RESPIRATORY_TRACT | Status: DC | PRN

## 2017-05-24 MED ORDER — OXYCODONE HCL 5 MG PO TABS
5.0000 mg | ORAL_TABLET | ORAL | Status: DC | PRN
  Administered 2017-05-24: 10 mg via ORAL

## 2017-05-24 MED ORDER — ALUM & MAG HYDROXIDE-SIMETH 200-200-20 MG/5ML PO SUSP: 30.0000 mL | ORAL | Status: DC | PRN

## 2017-05-24 MED ORDER — LEVOTHYROXINE SODIUM 112 MCG PO TABS
112.0000 ug | ORAL_TABLET | Freq: Every day | ORAL | Status: DC
  Administered 2017-05-25 – 2017-05-27 (×3): 112 ug via ORAL
  Filled 2017-05-24 (×3): qty 1

## 2017-05-24 MED ORDER — MIDAZOLAM HCL 2 MG/2ML IJ SOLN
INTRAMUSCULAR | Status: DC | PRN
  Administered 2017-05-24 (×2): 1 mg via INTRAVENOUS

## 2017-05-24 MED ORDER — ACETAMINOPHEN 325 MG PO TABS
325.0000 mg | ORAL_TABLET | Freq: Four times a day (QID) | ORAL | Status: DC | PRN
  Administered 2017-05-25 – 2017-05-26 (×2): 650 mg via ORAL
  Filled 2017-05-24 (×2): qty 2

## 2017-05-24 MED ORDER — MIDAZOLAM HCL 2 MG/2ML IJ SOLN
INTRAMUSCULAR | Status: AC
  Filled 2017-05-24: qty 2

## 2017-05-24 MED ORDER — CHLORHEXIDINE GLUCONATE 4 % EX LIQD: 60.0000 mL | Freq: Once | CUTANEOUS | Status: DC

## 2017-05-24 MED ORDER — ONDANSETRON HCL 4 MG/2ML IJ SOLN
INTRAMUSCULAR | Status: AC
  Filled 2017-05-24: qty 2

## 2017-05-24 MED ORDER — ONDANSETRON HCL 4 MG/2ML IJ SOLN
INTRAMUSCULAR | Status: DC | PRN
  Administered 2017-05-24: 4 mg via INTRAVENOUS

## 2017-05-24 MED ORDER — CLINDAMYCIN PHOSPHATE 900 MG/50ML IV SOLN
900.0000 mg | INTRAVENOUS | Status: AC
  Administered 2017-05-24: 900 mg via INTRAVENOUS

## 2017-05-24 MED ORDER — METOPROLOL SUCCINATE ER 50 MG PO TB24
50.0000 mg | ORAL_TABLET | Freq: Every day | ORAL | Status: DC
  Administered 2017-05-25 – 2017-05-27 (×3): 50 mg via ORAL
  Filled 2017-05-24 (×3): qty 1

## 2017-05-24 MED ORDER — PANTOPRAZOLE SODIUM 40 MG PO TBEC
40.0000 mg | DELAYED_RELEASE_TABLET | Freq: Every day | ORAL | Status: DC
  Administered 2017-05-24 – 2017-05-27 (×4): 40 mg via ORAL
  Filled 2017-05-24 (×4): qty 1

## 2017-05-24 MED ORDER — DIPHENHYDRAMINE HCL 12.5 MG/5ML PO ELIX: 12.5000 mg | ORAL_SOLUTION | ORAL | Status: DC | PRN

## 2017-05-24 MED ORDER — OXYCODONE HCL 5 MG PO TABS
10.0000 mg | ORAL_TABLET | ORAL | Status: DC | PRN
  Administered 2017-05-25: 15 mg via ORAL
  Filled 2017-05-24: qty 3

## 2017-05-24 MED ORDER — PROPOFOL 500 MG/50ML IV EMUL
INTRAVENOUS | Status: DC | PRN
  Administered 2017-05-24: 75 ug/kg/min via INTRAVENOUS

## 2017-05-24 MED ORDER — RIVAROXABAN 10 MG PO TABS
10.0000 mg | ORAL_TABLET | Freq: Every day | ORAL | Status: DC
  Administered 2017-05-25 – 2017-05-27 (×3): 10 mg via ORAL
  Filled 2017-05-24 (×3): qty 1

## 2017-05-24 MED ORDER — LIDOCAINE 2% (20 MG/ML) 5 ML SYRINGE
INTRAMUSCULAR | Status: AC
  Filled 2017-05-24: qty 5

## 2017-05-24 MED ORDER — LACTATED RINGERS IV SOLN
INTRAVENOUS | Status: DC | PRN
  Administered 2017-05-24: 12:00:00 via INTRAVENOUS

## 2017-05-24 MED ORDER — FERROUS SULFATE 325 (65 FE) MG PO TABS
325.0000 mg | ORAL_TABLET | Freq: Every day | ORAL | Status: DC
  Administered 2017-05-25 – 2017-05-26 (×2): 325 mg via ORAL
  Filled 2017-05-24 (×2): qty 1

## 2017-05-24 MED ORDER — HYDRALAZINE HCL 25 MG PO TABS
25.0000 mg | ORAL_TABLET | Freq: Two times a day (BID) | ORAL | Status: DC
  Administered 2017-05-24 – 2017-05-27 (×6): 25 mg via ORAL
  Filled 2017-05-24 (×7): qty 1

## 2017-05-24 MED ORDER — PHENOL 1.4 % MT LIQD: 1.0000 | OROMUCOSAL | Status: DC | PRN

## 2017-05-24 MED ORDER — PHENYLEPHRINE 40 MCG/ML (10ML) SYRINGE FOR IV PUSH (FOR BLOOD PRESSURE SUPPORT)
PREFILLED_SYRINGE | INTRAVENOUS | Status: AC
  Filled 2017-05-24: qty 10

## 2017-05-24 MED ORDER — ONDANSETRON HCL 4 MG/2ML IJ SOLN: 4.0000 mg | Freq: Four times a day (QID) | INTRAMUSCULAR | Status: DC | PRN

## 2017-05-24 MED ORDER — CLINDAMYCIN PHOSPHATE 600 MG/50ML IV SOLN
600.0000 mg | Freq: Four times a day (QID) | INTRAVENOUS | Status: AC
  Administered 2017-05-25: 600 mg via INTRAVENOUS
  Filled 2017-05-24 (×2): qty 50

## 2017-05-24 MED ORDER — METFORMIN HCL ER 500 MG PO TB24
750.0000 mg | ORAL_TABLET | Freq: Two times a day (BID) | ORAL | Status: DC
Start: 2017-05-24 — End: 2017-05-25
  Administered 2017-05-25: 750 mg via ORAL
  Filled 2017-05-24 (×2): qty 1.5

## 2017-05-24 MED ORDER — METOCLOPRAMIDE HCL 5 MG PO TABS: 5.0000 mg | ORAL_TABLET | Freq: Three times a day (TID) | ORAL | Status: DC | PRN

## 2017-05-24 MED ORDER — METHOCARBAMOL 1000 MG/10ML IJ SOLN
500.0000 mg | Freq: Four times a day (QID) | INTRAMUSCULAR | Status: DC | PRN
  Filled 2017-05-24: qty 5

## 2017-05-24 MED ORDER — PHENYLEPHRINE 40 MCG/ML (10ML) SYRINGE FOR IV PUSH (FOR BLOOD PRESSURE SUPPORT)
PREFILLED_SYRINGE | INTRAVENOUS | Status: AC
Start: 2017-05-24 — End: ?
  Filled 2017-05-24: qty 10

## 2017-05-24 MED ORDER — SODIUM CHLORIDE 0.9 % IV SOLN
INTRAVENOUS | Status: DC
  Administered 2017-05-24: 15:00:00 via INTRAVENOUS

## 2017-05-24 MED ORDER — OXYCODONE HCL 5 MG PO TABS
ORAL_TABLET | ORAL | Status: AC
  Filled 2017-05-24: qty 2

## 2017-05-24 MED ORDER — FENTANYL CITRATE (PF) 250 MCG/5ML IJ SOLN
INTRAMUSCULAR | Status: AC
  Filled 2017-05-24: qty 5

## 2017-05-24 MED ORDER — CLINDAMYCIN PHOSPHATE 900 MG/50ML IV SOLN
INTRAVENOUS | Status: AC
  Filled 2017-05-24: qty 50

## 2017-05-24 MED ORDER — DEXAMETHASONE SODIUM PHOSPHATE 10 MG/ML IJ SOLN
INTRAMUSCULAR | Status: AC
  Filled 2017-05-24: qty 1

## 2017-05-24 MED ORDER — AMLODIPINE BESYLATE 5 MG PO TABS
5.0000 mg | ORAL_TABLET | Freq: Every day | ORAL | Status: DC
Start: 2017-05-25 — End: 2017-05-27
  Administered 2017-05-25 – 2017-05-27 (×3): 5 mg via ORAL
  Filled 2017-05-24 (×3): qty 1

## 2017-05-24 MED ORDER — ONDANSETRON HCL 4 MG PO TABS: 4.0000 mg | ORAL_TABLET | Freq: Four times a day (QID) | ORAL | Status: DC | PRN

## 2017-05-24 MED ORDER — 0.9 % SODIUM CHLORIDE (POUR BTL) OPTIME
TOPICAL | Status: DC | PRN
  Administered 2017-05-24: 1000 mL

## 2017-05-24 MED ORDER — PHENYLEPHRINE 40 MCG/ML (10ML) SYRINGE FOR IV PUSH (FOR BLOOD PRESSURE SUPPORT)
PREFILLED_SYRINGE | INTRAVENOUS | Status: DC | PRN
  Administered 2017-05-24: 80 ug via INTRAVENOUS
  Administered 2017-05-24: 120 ug via INTRAVENOUS
  Administered 2017-05-24 (×3): 80 ug via INTRAVENOUS
  Administered 2017-05-24: 40 ug via INTRAVENOUS
  Administered 2017-05-24 (×2): 80 ug via INTRAVENOUS
  Administered 2017-05-24: 40 ug via INTRAVENOUS
  Administered 2017-05-24: 120 ug via INTRAVENOUS

## 2017-05-24 MED ORDER — LACTATED RINGERS IV SOLN
INTRAVENOUS | Status: DC
  Administered 2017-05-24: 12:00:00 via INTRAVENOUS

## 2017-05-24 MED ORDER — SODIUM CHLORIDE 0.9 % IR SOLN
Status: DC | PRN
  Administered 2017-05-24: 3000 mL

## 2017-05-24 MED ORDER — PRAVASTATIN SODIUM 20 MG PO TABS
20.0000 mg | ORAL_TABLET | Freq: Every day | ORAL | Status: DC
  Administered 2017-05-24 – 2017-05-26 (×3): 20 mg via ORAL
  Filled 2017-05-24 (×4): qty 1

## 2017-05-24 MED ORDER — PROPOFOL 10 MG/ML IV BOLUS
INTRAVENOUS | Status: DC | PRN
  Administered 2017-05-24: 10 mg via INTRAVENOUS

## 2017-05-24 MED ORDER — BUPIVACAINE HCL (PF) 0.5 % IJ SOLN
INTRAMUSCULAR | Status: DC | PRN
  Administered 2017-05-24: 3 mL via INTRATHECAL

## 2017-05-24 MED ORDER — METHOCARBAMOL 500 MG PO TABS
ORAL_TABLET | ORAL | Status: AC
  Filled 2017-05-24: qty 1

## 2017-05-24 MED ORDER — ALLOPURINOL 100 MG PO TABS
100.0000 mg | ORAL_TABLET | Freq: Every day | ORAL | Status: DC
  Administered 2017-05-24 – 2017-05-27 (×4): 100 mg via ORAL
  Filled 2017-05-24 (×4): qty 1

## 2017-05-24 MED ORDER — FENTANYL CITRATE (PF) 250 MCG/5ML IJ SOLN
INTRAMUSCULAR | Status: DC | PRN
  Administered 2017-05-24 (×2): 50 ug via INTRAVENOUS

## 2017-05-24 SURGICAL SUPPLY — 54 items
BENZOIN TINCTURE PRP APPL 2/3 (GAUZE/BANDAGES/DRESSINGS) ×3 IMPLANT
BLADE CLIPPER SURG (BLADE) IMPLANT
BLADE SAW SGTL 18X1.27X75 (BLADE) ×2 IMPLANT
BLADE SAW SGTL 18X1.27X75MM (BLADE) ×1
CAPT HIP TOTAL 2 ×3 IMPLANT
CELLS DAT CNTRL 66122 CELL SVR (MISCELLANEOUS) IMPLANT
CLOSURE WOUND 1/2 X4 (GAUZE/BANDAGES/DRESSINGS) ×2
COVER SURGICAL LIGHT HANDLE (MISCELLANEOUS) ×3 IMPLANT
DRAPE C-ARM 42X72 X-RAY (DRAPES) ×3 IMPLANT
DRAPE STERI IOBAN 125X83 (DRAPES) ×3 IMPLANT
DRAPE U-SHAPE 47X51 STRL (DRAPES) ×9 IMPLANT
DRSG AQUACEL AG ADV 3.5X10 (GAUZE/BANDAGES/DRESSINGS) ×3 IMPLANT
DURAPREP 26ML APPLICATOR (WOUND CARE) ×6 IMPLANT
ELECT BLADE 4.0 EZ CLEAN MEGAD (MISCELLANEOUS) ×3
ELECT BLADE 6.5 EXT (BLADE) ×3 IMPLANT
ELECT REM PT RETURN 9FT ADLT (ELECTROSURGICAL) ×3
ELECTRODE BLDE 4.0 EZ CLN MEGD (MISCELLANEOUS) ×1 IMPLANT
ELECTRODE REM PT RTRN 9FT ADLT (ELECTROSURGICAL) ×1 IMPLANT
FACESHIELD WRAPAROUND (MASK) ×3 IMPLANT
GAUZE XEROFORM 5X9 LF (GAUZE/BANDAGES/DRESSINGS) ×3 IMPLANT
GLOVE BIOGEL PI IND STRL 8 (GLOVE) ×2 IMPLANT
GLOVE BIOGEL PI INDICATOR 8 (GLOVE) ×4
GLOVE ECLIPSE 8.0 STRL XLNG CF (GLOVE) IMPLANT
GLOVE ORTHO TXT STRL SZ7.5 (GLOVE) IMPLANT
GOWN STRL REUS W/ TWL LRG LVL3 (GOWN DISPOSABLE) ×2 IMPLANT
GOWN STRL REUS W/ TWL XL LVL3 (GOWN DISPOSABLE) ×2 IMPLANT
GOWN STRL REUS W/TWL LRG LVL3 (GOWN DISPOSABLE) ×4
GOWN STRL REUS W/TWL XL LVL3 (GOWN DISPOSABLE) ×4
HANDPIECE INTERPULSE COAX TIP (DISPOSABLE) ×2
KIT BASIN OR (CUSTOM PROCEDURE TRAY) ×3 IMPLANT
KIT TURNOVER KIT B (KITS) ×3 IMPLANT
MANIFOLD NEPTUNE II (INSTRUMENTS) ×3 IMPLANT
NS IRRIG 1000ML POUR BTL (IV SOLUTION) ×3 IMPLANT
PACK TOTAL JOINT (CUSTOM PROCEDURE TRAY) ×3 IMPLANT
PAD ARMBOARD 7.5X6 YLW CONV (MISCELLANEOUS) ×3 IMPLANT
RTRCTR WOUND ALEXIS 18CM MED (MISCELLANEOUS)
SET HNDPC FAN SPRY TIP SCT (DISPOSABLE) ×1 IMPLANT
STAPLER VISISTAT 35W (STAPLE) ×3 IMPLANT
STRIP CLOSURE SKIN 1/2X4 (GAUZE/BANDAGES/DRESSINGS) ×4 IMPLANT
SUT ETHIBOND NAB CT1 #1 30IN (SUTURE) ×3 IMPLANT
SUT MNCRL AB 4-0 PS2 18 (SUTURE) IMPLANT
SUT VIC AB 0 CT1 27 (SUTURE) ×2
SUT VIC AB 0 CT1 27XBRD ANBCTR (SUTURE) ×1 IMPLANT
SUT VIC AB 1 CT1 27 (SUTURE) ×2
SUT VIC AB 1 CT1 27XBRD ANBCTR (SUTURE) ×1 IMPLANT
SUT VIC AB 2-0 CT1 27 (SUTURE) ×4
SUT VIC AB 2-0 CT1 TAPERPNT 27 (SUTURE) ×2 IMPLANT
TOWEL OR 17X24 6PK STRL BLUE (TOWEL DISPOSABLE) ×3 IMPLANT
TOWEL OR 17X26 10 PK STRL BLUE (TOWEL DISPOSABLE) ×3 IMPLANT
TRAY CATH 16FR W/PLASTIC CATH (SET/KITS/TRAYS/PACK) IMPLANT
TRAY FOL W/BAG SLVR 16FR STRL (SET/KITS/TRAYS/PACK) ×1 IMPLANT
TRAY FOLEY W/BAG SLVR 16FR LF (SET/KITS/TRAYS/PACK) ×2
TRAY FOLEY W/METER SILVER 16FR (SET/KITS/TRAYS/PACK) IMPLANT
WATER STERILE IRR 1000ML POUR (IV SOLUTION) ×6 IMPLANT

## 2017-05-24 NOTE — Anesthesia Procedure Notes (Signed)
Spinal  Patient location during procedure: OR Start time: 05/24/2017 12:15 PM End time: 05/24/2017 12:18 PM Staffing Anesthesiologist: Arta Brucessey, Nicolo Tomko, MD Performed: anesthesiologist  Preanesthetic Checklist Completed: patient identified, surgical consent, pre-op evaluation, timeout performed, IV checked, risks and benefits discussed and monitors and equipment checked Spinal Block Patient position: sitting Prep: DuraPrep Patient monitoring: heart rate, cardiac monitor, continuous pulse ox and blood pressure Approach: right paramedian Location: L3-4 Injection technique: single-shot Needle Needle type: Pencan  Needle gauge: 24 G Needle length: 9 cm Needle insertion depth: 5 cm

## 2017-05-24 NOTE — Anesthesia Preprocedure Evaluation (Addendum)
Anesthesia Evaluation  Patient identified by MRN, date of birth, ID band Patient awake    Reviewed: Allergy & Precautions, NPO status , Patient's Chart, lab work & pertinent test results  Airway Mallampati: II  TM Distance: >3 FB Neck ROM: Full    Dental  (+) Teeth Intact, Dental Advisory Given,    Pulmonary shortness of breath, neg COPD,    Pulmonary exam normal        Cardiovascular hypertension, Pt. on medications Normal cardiovascular exam - Friction Rub    Neuro/Psych    GI/Hepatic GERD  Medicated,  Endo/Other  diabetes, Type 2, Oral Hypoglycemic AgentsHypothyroidism   Renal/GU      Musculoskeletal  (+) Arthritis , Osteoarthritis,    Abdominal   Peds  Hematology   Anesthesia Other Findings   Reproductive/Obstetrics                            Anesthesia Physical Anesthesia Plan  ASA: III  Anesthesia Plan: MAC and Spinal   Post-op Pain Management:    Induction: Intravenous  PONV Risk Score and Plan:   Airway Management Planned: Mask and Natural Airway  Additional Equipment:   Intra-op Plan:   Post-operative Plan:   Informed Consent: I have reviewed the patients History and Physical, chart, labs and discussed the procedure including the risks, benefits and alternatives for the proposed anesthesia with the patient or authorized representative who has indicated his/her understanding and acceptance.   Dental advisory given  Plan Discussed with: Anesthesiologist, CRNA and Surgeon  Anesthesia Plan Comments:         Anesthesia Quick Evaluation

## 2017-05-24 NOTE — Anesthesia Procedure Notes (Signed)
Procedure Name: MAC Date/Time: 05/24/2017 12:10 PM Performed by: Harden Mo, CRNA Pre-anesthesia Checklist: Patient identified, Emergency Drugs available, Suction available and Patient being monitored Patient Re-evaluated:Patient Re-evaluated prior to induction Oxygen Delivery Method: Simple face mask Preoxygenation: Pre-oxygenation with 100% oxygen Induction Type: IV induction Placement Confirmation: positive ETCO2 and breath sounds checked- equal and bilateral Dental Injury: Teeth and Oropharynx as per pre-operative assessment

## 2017-05-24 NOTE — Brief Op Note (Signed)
05/24/2017  1:37 PM  PATIENT:  Dana Nelson  72 y.o. female  PRE-OPERATIVE DIAGNOSIS:  osteoarthritis right hip  POST-OPERATIVE DIAGNOSIS:  osteoarthritis right hip  PROCEDURE:  Procedure(s): RIGHT TOTAL HIP ARTHROPLASTY ANTERIOR APPROACH (Right)  SURGEON:  Surgeon(s) and Role:    Kathryne Hitch* Kanan Sobek Y, MD - Primary  PHYSICIAN ASSISTANT: Rexene EdisonGil Clark, PA-C  ANESTHESIA:   spinal  EBL:  300 mL   COUNTS:  YES  DICTATION: .Other Dictation: Dictation Number [id number]  PLAN OF CARE: Admit to inpatient   PATIENT DISPOSITION:  PACU - hemodynamically stable.   Delay start of Pharmacological VTE agent (>24hrs) due to surgical blood loss or risk of bleeding: no

## 2017-05-24 NOTE — Anesthesia Postprocedure Evaluation (Signed)
Anesthesia Post Note  Patient: Dana EpleyRegina Nelson  Procedure(s) Performed: RIGHT TOTAL HIP ARTHROPLASTY ANTERIOR APPROACH (Right )     Patient location during evaluation: PACU Anesthesia Type: Spinal Level of consciousness: oriented and awake and alert Pain management: pain level controlled Vital Signs Assessment: post-procedure vital signs reviewed and stable Respiratory status: spontaneous breathing, respiratory function stable and patient connected to nasal cannula oxygen Cardiovascular status: blood pressure returned to baseline and stable Postop Assessment: no headache, no backache and no apparent nausea or vomiting Anesthetic complications: no    Last Vitals:  Vitals:   05/24/17 1515 05/24/17 1530  BP: 115/71 108/69  Pulse: 73 70  Resp: (!) 23 16  Temp:    SpO2: 95% 94%    Last Pain:  Vitals:   05/24/17 1530  PainSc: 0-No pain                 Aubreana Cornacchia DAVID

## 2017-05-24 NOTE — Transfer of Care (Signed)
Immediate Anesthesia Transfer of Care Note  Patient: Dana Nelson  Procedure(s) Performed: RIGHT TOTAL HIP ARTHROPLASTY ANTERIOR APPROACH (Right )  Patient Location: PACU  Anesthesia Type:MAC and Spinal  Level of Consciousness: awake, alert  and oriented  Airway & Oxygen Therapy: Patient Spontanous Breathing and Patient connected to face mask oxygen  Post-op Assessment: Report given to RN and Post -op Vital signs reviewed and stable  Post vital signs: Reviewed and stable  Last Vitals:  Vitals Value Taken Time  BP 106/64 05/24/2017  1:58 PM  Temp    Pulse 74 05/24/2017  1:58 PM  Resp 22 05/24/2017  1:58 PM  SpO2 100 % 05/24/2017  1:58 PM  Vitals shown include unvalidated device data.  Last Pain:  Vitals:   05/24/17 1053  PainSc: 0-No pain         Complications: No apparent anesthesia complications

## 2017-05-24 NOTE — H&P (Signed)
TOTAL HIP ADMISSION H&P  Patient is admitted for right total hip arthroplasty.  Subjective:  Chief Complaint: right hip pain  HPI: Dana Nelson, 72 y.o. female, has a history of pain and functional disability in the right hip(s) due to arthritis and patient has failed non-surgical conservative treatments for greater than 12 weeks to include NSAID's and/or analgesics, corticosteriod injections, flexibility and strengthening excercises, use of assistive devices, weight reduction as appropriate and activity modification.  Onset of symptoms was gradual starting 3 years ago with gradually worsening course since that time.The patient noted no past surgery on the right hip(s).  Patient currently rates pain in the right hip at 10 out of 10 with activity. Patient has night pain, worsening of pain with activity and weight bearing, trendelenberg gait, pain that interfers with activities of daily living, pain with passive range of motion and crepitus. Patient has evidence of subchondral cysts, subchondral sclerosis, periarticular osteophytes and joint space narrowing by imaging studies. This condition presents safety issues increasing the risk of falls.  There is no current active infection.  Patient Active Problem List   Diagnosis Date Noted  . Unilateral primary osteoarthritis, right hip 04/19/2017  . Unilateral primary osteoarthritis, left hip 04/19/2017  . Bilateral primary osteoarthritis of hip 04/08/2017  . Chronic anticoagulation 12/27/2016  . Hypomagnesemia 12/27/2016  . Diabetes mellitus without complication (HCC) 12/18/2016  . Osteoarthritis 12/18/2016  . Right knee pain 12/07/2016  . Right hip pain 12/07/2016  . AVM (arteriovenous malformation) of small bowel, acquired   . Hypokalemia 12/04/2016  . Melena 12/04/2016  . GI bleed 12/04/2016  . Hematochezia   . Acute blood loss anemia   . Chronic iron deficiency anemia   . Angioedema 08/17/2016  . Blood loss anemia 08/17/2016  . DVT (deep  venous thrombosis) (HCC) 08/17/2016  . HTN (hypertension) 08/17/2016  . Hyperlipidemia 08/17/2016  . Microcytic anemia 08/17/2016  . Hypothyroidism 08/17/2016  . GERD (gastroesophageal reflux disease) 08/17/2016   Past Medical History:  Diagnosis Date  . Acute blood loss anemia   . Angioedema 2018  . Asthma    not current  . AVM (arteriovenous malformation) of small bowel, acquired    Jejunum - capsule endo 09/2016  . DVT (deep venous thrombosis) (HCC) 06/2016  . Dyspnea    with exertion  . Eczema   . Eczema   . GERD (gastroesophageal reflux disease) 08/17/2016  . GI bleed 12/04/2016  . Gout   . History of blood transfusion 11/2016   GI bleed  . Hyperlipemia   . Hypertension   . Hypothyroidism   . Iron deficiency anemia   . Lip swelling 08/2016  . Melena 12/04/2016  . Open-angle glaucoma   . Osteoarthritis   . Pre-diabetes    05/13/17- "Dr said she will probably stop it"  . Right hip pain 12/07/2016  . Right knee pain 12/07/2016  . Vitamin D deficiency     Past Surgical History:  Procedure Laterality Date  . ABDOMINAL HYSTERECTOMY    . COLONOSCOPY  2018   [place] one polyp  . ENTEROSCOPY N/A 12/06/2016   Procedure: ENTEROSCOPY;  Surgeon: Sherrilyn Rist, MD;  Location: [facility] ENDOSCOPY;  Service: Gastroenterology;  Laterality: N/A;  . ESOPHAGOGASTRODUODENOSCOPY  2018   [place] - gastritis  . GIVENS CAPSULE STUDY  2018   [place] - jejunal AVM's, gastritis 2h 6 min small bowel passage, AVM first at 1 hr post duodenum  . SMALL BOWEL ENTEROSCOPY  03/21/2017  . THYROIDECTOMY  Current Facility-Administered Medications  Medication Dose Route Frequency Provider Last Rate Last Dose  . chlorhexidine (HIBICLENS) 4 % liquid 4 application  60 mL Topical Once Richardean Canallark, Gilbert W, PA-C      . clindamycin (CLEOCIN) 900 MG/50ML IVPB           . clindamycin (CLEOCIN) IVPB 900 mg  900 mg Intravenous On Call to OR Kirtland Bouchardlark, Gilbert W, PA-C      . lactated ringers infusion   Intravenous Continuous  Arta Brucessey, Kevin, MD 50 mL/hr at 05/24/17 1144     Allergies  Allergen Reactions  . Latex Swelling  . Eggs Or Egg-Derived Products Other (See Comments)    [facility] - unknown reaction  . Losartan Swelling    Swelling of lips and face  . Other Other (See Comments)    Hives and facial swelling from all nuts (tree nuts and peanuts)  . Peanut-Containing Drug Products Hives and Swelling    Facial swelling  . Penicillins Other (See Comments)    Childhood allergy- convulstions Has patient had a PCN reaction causing immediate rash, facial/tongue/throat swelling, SOB or lightheadedness with hypotension: Yes Has patient had a PCN reaction causing severe rash involving mucus membranes or skin necrosis: No Has patient had a PCN reaction that required hospitalization: Yes Has patient had a PCN reaction occurring within the last 10 years: No If all of the above answers are "NO", then may proceed with Cephalosporin use.    Social History   Tobacco Use  . Smoking status: Never Smoker  . Smokeless tobacco: Never Used  Substance Use Topics  . Alcohol use: No    Family History  Problem Relation Age of Onset  . Dementia Mother        died at 2886  . Hypertension Mother   . Heart attack Father        died 7493  . Hypertension Father   . Ovarian cancer Sister   . Heart attack Brother   . Asthma Son   . Heart attack Sister      Review of Systems  Musculoskeletal: Positive for joint pain.  All other systems reviewed and are negative.   Objective:  Physical Exam  Constitutional: She is oriented to person, place, and time. She appears well-developed and well-nourished.  HENT:  Head: Normocephalic and atraumatic.  Eyes: Pupils are equal, round, and reactive to light. EOM are normal.  Neck: Normal range of motion. Neck supple.  Cardiovascular: Normal rate and regular rhythm.  Respiratory: Effort normal and breath sounds normal.  GI: Soft. Bowel sounds are normal.   Musculoskeletal:       Right hip: She exhibits decreased range of motion, decreased strength, tenderness and bony tenderness.  Neurological: She is alert and oriented to person, place, and time.  Skin: Skin is warm and dry.  Psychiatric: She has a normal mood and affect.    Vital signs in last 24 hours: Weight:  [164 lb (74.4 kg)] 164 lb (74.4 kg) (04/16 1021)  Labs:   Estimated body mass index is 29.05 kg/m as calculated from the following:   Height as of this encounter: 5\' 3"  (1.6 m).   Weight as of this encounter: 164 lb (74.4 kg).   Imaging Review Plain radiographs demonstrate severe degenerative joint disease of the right hip(s). The bone quality appears to be good for age and reported activity level.    Preoperative templating of the joint replacement has been completed, documented, and submitted to the  Operating Room personnel in order to optimize intra-operative equipment management.   Anticipated LOS equal to or greater than 2 midnights due to - Age 54 and older with one or more of the following:  - Obesity  - Expected need for hospital services (PT, OT, Nursing) required for safe  discharge  - Anticipated need for postoperative skilled nursing care or inpatient rehab  - Active co-morbidities: Diabetes, Coronary Artery Disease and Anemia OR     Assessment/Plan:  End stage arthritis, right hip(s)  The patient history, physical examination, clinical judgement of the provider and imaging studies are consistent with end stage degenerative joint disease of the right hip(s) and total hip arthroplasty is deemed medically necessary. The treatment options including medical management, injection therapy, arthroscopy and arthroplasty were discussed at length. The risks and benefits of total hip arthroplasty were presented and reviewed. The risks due to aseptic loosening, infection, stiffness, dislocation/subluxation,  thromboembolic complications and other imponderables were  discussed.  The patient acknowledged the explanation, agreed to proceed with the plan and consent was signed. Patient is being admitted for inpatient treatment for surgery, pain control, PT, OT, prophylactic antibiotics, VTE prophylaxis, progressive ambulation and ADL's and discharge planning.The patient is planning to be discharged home with home health services

## 2017-05-25 ENCOUNTER — Encounter (HOSPITAL_COMMUNITY): Payer: Self-pay | Admitting: Orthopaedic Surgery

## 2017-05-25 LAB — BASIC METABOLIC PANEL
Anion gap: 10 (ref 5–15)
BUN: 12 mg/dL (ref 6–20)
CHLORIDE: 104 mmol/L (ref 101–111)
CO2: 22 mmol/L (ref 22–32)
Calcium: 9.4 mg/dL (ref 8.9–10.3)
Creatinine, Ser: 0.59 mg/dL (ref 0.44–1.00)
GFR calc non Af Amer: >60 mL/min (ref >60)
Glucose, Bld: 114 mg/dL — ABNORMAL HIGH (ref 65–99)
POTASSIUM: 4.5 mmol/L (ref 3.5–5.1)
SODIUM: 136 mmol/L (ref 135–145)

## 2017-05-25 LAB — CBC
HEMATOCRIT: 25.9 % — AB (ref 36.0–46.0)
HEMOGLOBIN: 7.7 g/dL — AB (ref 12.0–15.0)
MCH: 21 pg — ABNORMAL LOW (ref 26.0–34.0)
MCHC: 29.7 g/dL — ABNORMAL LOW (ref 30.0–36.0)
MCV: 70.6 fL — ABNORMAL LOW (ref 78.0–100.0)
Platelets: 446 10*3/uL — ABNORMAL HIGH (ref 150–400)
RBC: 3.67 MIL/uL — AB (ref 3.87–5.11)
RDW: 19.9 % — ABNORMAL HIGH (ref 11.5–15.5)
WBC: 9.4 10*3/uL (ref 4.0–10.5)

## 2017-05-25 LAB — PREPARE RBC (CROSSMATCH)

## 2017-05-25 MED ORDER — METFORMIN HCL ER 750 MG PO TB24
750.0000 mg | ORAL_TABLET | Freq: Two times a day (BID) | ORAL | Status: DC
  Administered 2017-05-26 – 2017-05-27 (×3): 750 mg via ORAL
  Filled 2017-05-25 (×6): qty 1

## 2017-05-25 MED ORDER — FUROSEMIDE 10 MG/ML IJ SOLN
20.0000 mg | Freq: Once | INTRAMUSCULAR | Status: AC
  Administered 2017-05-25: 20 mg via INTRAVENOUS
  Filled 2017-05-25: qty 2

## 2017-05-25 MED ORDER — SODIUM CHLORIDE 0.9 % IV SOLN
Freq: Once | INTRAVENOUS | Status: AC
  Administered 2017-05-25: 08:00:00 via INTRAVENOUS

## 2017-05-25 MED ORDER — METFORMIN HCL ER 750 MG PO TB24: 750.0000 mg | ORAL_TABLET | Freq: Two times a day (BID) | ORAL | Status: DC

## 2017-05-25 NOTE — Evaluation (Signed)
Physical Therapy Evaluation Patient Details Name: Dana EpleyRegina Fisch MRN: [id number] DOB: [birth year] Today's Date: 05/25/2017   History of Present Illness  Pt s/p R direct THA with PMH HTN, osteorarthritis, and anemia.   Clinical Impression  Patient is s/p above surgery resulting in functional limitations due to the deficits listed below (see PT Problem List). PTA, pt living at home with grandson in level entrance home, limited to w/c around home due to pain in BLE and BUE. Upon eval pt presents with limitations in BUE likely due to arthritis and moderate post op pain. Pt is Max A x2 for safe transfers to bed<>chair. Pt states she needs to be home so that her grandson can complete school, and has her two sisters staying with her for 2 weeks who can help. Family not present right now, but will be here tomorrow. Will need to focus on family education on safe handling.  Patient will benefit from skilled PT to increase their independence and safety with mobility to allow discharge to the venue listed below.       Follow Up Recommendations Follow surgeon's recommendation for DC plan and follow-up therapies;Supervision/Assistance - 24 hour;Home health PT    Equipment Recommendations  None recommended by PT    Recommendations for Other Services       Precautions / Restrictions Precautions Precautions: Fall Restrictions Weight Bearing Restrictions: No      Mobility  Bed Mobility Overal bed mobility: Needs Assistance Bed Mobility: Sit to Supine     Supine to sit: +2 for safety/equipment;+2 for physical assistance;Max assist     General bed mobility comments: pt with decreased ability to use L LE to help with scooting around to EOB. Also limited use of UEs due to severe arthritis.   Transfers Overall transfer level: Needs assistance Equipment used: Rolling walker (2 wheeled) Transfers: Sit to/from Stand Sit to Stand: +2 physical assistance;+2 safety/equipment;Mod assist          General transfer comment: Patient with Max A x2 for transfer into bed, unable to tolerate standing without assistance  Ambulation/Gait             General Gait Details: unable  Stairs            Wheelchair Mobility    Modified Rankin (Stroke Patients Only)       Balance Overall balance assessment: Needs assistance   Sitting balance-Leahy Scale: Fair       Standing balance-Leahy Scale: Zero                               Pertinent Vitals/Pain Pain Assessment: Faces Pain Score: 4  Faces Pain Scale: Hurts little more Pain Location: bilateral shoulder with reaching, R LE.  Pain Descriptors / Indicators: Aching Pain Intervention(s): Limited activity within patient's tolerance;Monitored during session;Premedicated before session;Repositioned    Home Living Family/patient expects to be discharged to:: Private residence Living Arrangements: Other relatives Available Help at Discharge: Family;Available 24 hours/day Type of Home: Apartment Home Access: Level entry     Home Layout: One level Home Equipment: Bedside commode;Grab bars - toilet;Grab bars - tub/shower;Hand held shower head;Adaptive equipment;Wheelchair - manual;Other (comment) Additional Comments: Pt has a rollator but only uses to sit on for cooking. Pivots into wheelchair without walker. Does not currently ambulate.    Prior Function Level of Independence: Needs assistance   Gait / Transfers Assistance Needed: Uses rollator  ADL's / Homemaking Assistance Needed: Pt does own  bathing, dressing, toileting but does have some help wiht meal prep and household tasks.   Comments: Pt became very emotional speaking of how she is not able to caregive for her daughter and grandson how she would like because her body is "falling apart."  This pt is accustomed to being the caregiver and being independent and struggles with the role of being the patient.  Very motivated.     Hand Dominance    Dominant Hand: Left    Extremity/Trunk Assessment   Upper Extremity Assessment Upper Extremity Assessment: Defer to OT evaluation RUE Deficits / Details: very limited shoulder flexion. Uses compensatory movement to perform tasks.  LUE Deficits / Details: Very limited shoulder flexion as above. Pt reports severe arthritis.     Lower Extremity Assessment Lower Extremity Assessment: Generalized weakness       Communication   Communication: No difficulties  Cognition Arousal/Alertness: Awake/alert Behavior During Therapy: WFL for tasks assessed/performed Overall Cognitive Status: Within Functional Limits for tasks assessed                                        General Comments      Exercises General Exercises - Lower Extremity Ankle Circles/Pumps: 20 reps Quad Sets: 10 reps   Assessment/Plan    PT Assessment Patient needs continued PT services  PT Problem List Decreased strength;Decreased range of motion;Decreased activity tolerance;Decreased balance;Decreased mobility;Decreased coordination;Pain       PT Treatment Interventions DME instruction;Gait training;Stair training;Functional mobility training;Therapeutic activities;Therapeutic exercise;Balance training;Neuromuscular re-education    PT Goals (Current goals can be found in the Care Plan section)  Acute Rehab PT Goals Patient Stated Goal: return home with care from sisters PT Goal Formulation: With patient Time For Goal Achievement: 06/01/17 Potential to Achieve Goals: Fair    Frequency 7X/week   Barriers to discharge        Co-evaluation               AM-PAC PT "6 Clicks" Daily Activity  Outcome Measure Difficulty turning over in bed (including adjusting bedclothes, sheets and blankets)?: Unable Difficulty moving from lying on back to sitting on the side of the bed? : Unable Difficulty sitting down on and standing up from a chair with arms (e.g., wheelchair, bedside commode,  etc,.)?: Unable Help needed moving to and from a bed to chair (including a wheelchair)?: A Lot Help needed walking in hospital room?: Total Help needed climbing 3-5 steps with a railing? : Total 6 Click Score: 7    End of Session Equipment Utilized During Treatment: Gait belt Activity Tolerance: Patient tolerated treatment well Patient left: in bed;with call bell/phone within reach Nurse Communication: Mobility status PT Visit Diagnosis: Unsteadiness on feet (R26.81);Other abnormalities of gait and mobility (R26.89);Pain;Difficulty in walking, not elsewhere classified (R26.2) Pain - Right/Left: Right Pain - part of body: Shoulder;Hip    Time: 1610-9604 PT Time Calculation (min) (ACUTE ONLY): 35 min   Charges:   PT Evaluation $PT Eval Moderate Complexity: 1 Mod PT Treatments $Therapeutic Activity: 8-22 mins   PT G Codes:       Etta Grandchild, PT, DPT Acute Rehab Services Pager: [phone]   Etta Grandchild 05/25/2017, 4:51 PM

## 2017-05-25 NOTE — Evaluation (Addendum)
Occupational Therapy Evaluation Patient Details Name: Dana Nelson MRN: [id number] DOB: 07-25-1945 Today's Date: 05/25/2017    History of Present Illness Pt s/p R direct THA with PMH HTN, osteorarthritis, and anemia.    Clinical Impression   Pt currently at +2 mod to +2 max/total assist due to very limited with UEs from arthritis to be able to self assist with transfers and ADL. Pt also states her L LE is very weak and with severe arthritis and therefore limited ability to use L LE to help with transfers today. Pt states she has two sisters that will be with her for a couple of weeks to help her. If pt can progress to a level to safely d/c home, feel she will benefit from [facility]. She was wheelchair bound at home and did not ambulate. Will continue to follow to progress ADL.     Follow Up Recommendations  Home health OT;Supervision/Assistance - 24 hour    Equipment Recommendations  None recommended by OT    Recommendations for Other Services       Precautions / Restrictions Precautions Precautions: Fall Restrictions Weight Bearing Restrictions: No      Mobility Bed Mobility Overal bed mobility: Needs Assistance Bed Mobility: Supine to Sit     Supine to sit: +2 for safety/equipment;+2 for physical assistance;Total assist     General bed mobility comments: pt with decreased ability to use L LE to help with scooting around to EOB. Also limited use of UEs due to severe arthritis.   Transfers Overall transfer level: Needs assistance Equipment used: Rolling walker (2 wheeled) Transfers: Sit to/from Stand Sit to Stand: +2 physical assistance;+2 safety/equipment;Mod assist         General transfer comment: cues for hand placement and assist for bringing trunk to upright.     Balance                                           ADL either performed or assessed with clinical judgement   ADL Overall ADL's : Needs assistance/impaired Eating/Feeding:  Independent;Sitting   Grooming: Wash/dry hands;Set up;Sitting   Upper Body Bathing: Minimal assistance;Sitting   Lower Body Bathing: +2 for physical assistance;+2 for safety/equipment;Maximal assistance;Sit to/from stand Lower Body Bathing Details (indicate cue type and reason): +2 mod assist for standing aspect.  Upper Body Dressing : Minimal assistance;Sitting   Lower Body Dressing: +2 for safety/equipment;+2 for physical assistance;Total assistance;Sit to/from stand   Toilet Transfer: +2 for physical assistance;+2 for safety/equipment;Moderate assistance;Stand-pivot;RW;BSC   Toileting- Clothing Manipulation and Hygiene: +2 for safety/equipment;+2 for physical assistance;Total assistance;Sit to/from stand Toileting - Clothing Manipulation Details (indicate cue type and reason): pt currently unable to let go of walker to perform hygiene due to needing UE support for balance.        General ADL Comments: Pt has all AE except the LHS and uses it to dress at home. She is interested in sponge to help with bathing as she reports she has had difficulty with reaching to wash her feet. Pt needing much assist this visit with standing as she has limited use /strength in UEs.      Vision Patient Visual Report: No change from baseline       Perception     Praxis      Pertinent Vitals/Pain Pain Assessment: 0-10 Pain Score: 4  Pain Location: bilateral shoulder with reaching, R LE.  Pain Descriptors / Indicators: Aching Pain Intervention(s): Monitored during session     Hand Dominance     Extremity/Trunk Assessment Upper Extremity Assessment Upper Extremity Assessment: RUE deficits/detail;LUE deficits/detail RUE Deficits / Details: very limited shoulder flexion. Uses compensatory movement to perform tasks.  LUE Deficits / Details: Very limited shoulder flexion as above. Pt reports severe arthritis.            Communication Communication Communication: No difficulties    Cognition Arousal/Alertness: Awake/alert Behavior During Therapy: WFL for tasks assessed/performed Overall Cognitive Status: Within Functional Limits for tasks assessed                                     General Comments       Exercises     Shoulder Instructions      Home Living Family/patient expects to be discharged to:: Private residence Living Arrangements: Other relatives Available Help at Discharge: Family;Available 24 hours/day(available for 2 weeks 24/7) Type of Home: Apartment Home Access: Level entry     Home Layout: One level     Bathroom Shower/Tub: Tub/shower unit         Home Equipment: Bedside commode;Grab bars - toilet;Grab bars - tub/shower;Hand held shower head;Adaptive equipment;Wheelchair - Careers advisermanual;Other (comment) Adaptive Equipment: Reacher;Sock aid;Long-handled shoe horn Additional Comments: Pt has a rollator but only uses to sit on for cooking. Pivots into wheelchair without walker. Does not currently ambulate.      Prior Functioning/Environment Level of Independence: Needs assistance    ADL's / Homemaking Assistance Needed: Pt does own bathing, dressing, toileting but does have some help wiht meal prep and household tasks.             OT Problem List: Decreased strength;Decreased knowledge of use of DME or AE      OT Treatment/Interventions: Self-care/ADL training;DME and/or AE instruction;Therapeutic activities;Patient/family education    OT Goals(Current goals can be found in the care plan section) Acute Rehab OT Goals Patient Stated Goal: return to independence.  OT Goal Formulation: With patient Time For Goal Achievement: 06/01/17 Potential to Achieve Goals: Good  OT Frequency: Min 2X/week   Barriers to D/C:            Co-evaluation              AM-PAC PT "6 Clicks" Daily Activity     Outcome Measure Help from another person eating meals?: None Help from another person taking care of personal  grooming?: A Little Help from another person toileting, which includes using toliet, bedpan, or urinal?: A Lot Help from another person bathing (including washing, rinsing, drying)?: A Lot Help from another person to put on and taking off regular upper body clothing?: A Little Help from another person to put on and taking off regular lower body clothing?: A Lot 6 Click Score: 16   End of Session Equipment Utilized During Treatment: Gait belt;Rolling walker  Activity Tolerance: Patient tolerated treatment well Patient left: in chair;with call bell/phone within reach  OT Visit Diagnosis: Unsteadiness on feet (R26.81);Muscle weakness (generalized) (M62.81)                Time: [id number] OT Time Calculation (min): 31 min Charges:  OT General Charges $OT Visit: 1 Visit OT Evaluation $OT Eval Low Complexity: 1 Low OT Treatments $Therapeutic Activity: 8-22 mins G-Codes:       Zannie KehrStephanie S Mehki Klumpp 05/25/2017, 1:48 PM

## 2017-05-25 NOTE — Op Note (Signed)
NAME:  Dana Nelson, Dana Nelson                    ACCOUNT NO.:  MEDICAL RECORD NO.:  [id number]  LOCATION:                                 FACILITY:  PHYSICIAN:  Vanita PandaChristopher Y. Magnus IvanBlackman, M.D.DATE OF BIRTH:  [birth year]  DATE OF PROCEDURE:  05/24/2017 DATE OF DISCHARGE:                              OPERATIVE REPORT   PREOPERATIVE DIAGNOSIS:  Primary osteoarthritis and degenerative joint disease, right hip.  POSTOPERATIVE DIAGNOSIS:  Primary osteoarthritis and degenerative joint disease, right hip.  PROCEDURE:  Right total hip arthroplasty through direct anterior approach.  IMPLANTS:  DePuy Sector Gription acetabular component size 52, size 36 +0 polyethylene liner, size 11 Corail femoral component with standard offset, size 36 -2 metal hip ball.  SURGEON:  Vanita PandaChristopher Y. Magnus IvanBlackman, M.D.  ASSISTANT:  Richardean CanalGilbert Clark, PA-C.  ANESTHESIA:  Spinal.  ANTIBIOTICS:  900 mg of IV clindamycin.  BLOOD LOSS:  300 to 350 cc.  COMPLICATIONS:  None.  INDICATIONS:  [name] is a 72 year old female with debilitating arthritis involving both her hips.  Her right hip femoral head shows collapse.  Her pain is daily and it is severe.  It has detrimentally affected her activities of daily living, her quality of life, and her mobility.  At this point, she does wish to proceed with a total hip arthroplasty given the failure of conservative treatment and given the detrimental impact this hip has had on her life.  She understands fully the risks of acute blood loss anemia as well as nerve and vessel injury, fracture, infection, dislocation, DVT.  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  She understands that we will put her on blood thinning medication after this surgery as well.  She understands our goals are to decrease pain, improve mobility and overall improved quality of life.  PROCEDURE DESCRIPTION:  After informed consent was obtained, appropriate right hip was marked.  She was brought to the  operating room where spinal anesthesia was obtained while she was on her stretcher.  She was then laid in a supine position on a stretcher.  A Foley catheter was placed and both feet had traction boots applied to them.  Note that although her x-ray showed that she is long on the right when you compared standing films on exam clinically lying her down flat, she has actually significantly shorter on her right hip comparing the right and left hips.  We placed her supine on the Hana fracture table with the perineal post in place and both legs in inline skeletal traction devices, but no traction applied.  Her right operative hip was prepped and draped with DuraPrep and sterile drapes.  A time-out was called and she was identified as correct patient and correct right hip.  We then made an incision just inferior and posterior to the anterior superior iliac spine and carried this obliquely down the leg.  We dissected down the tensor fascia lata muscle.  The tensor fascia was then divided longitudinally to proceed with a direct anterior approach to the hip. We identified and cauterized the circumflex vessels and then identified the hip capsule.  I opened up the capsule in an L-type format,  finding a very large joint effusion and significant disease in her right hip.  We placed Cobra retractors around the lateral medial femoral neck and then made our femoral neck cut just proximal to the lesser trochanter and completed this with an osteotome.  We placed a corkscrew guide in the femoral head and removed the femoral head in its entirety and found to be completely devoid of cartilage.  We then cleaned the acetabulum, remnants of the acetabular labrum and other debris.  I then placed a bent Hohmann over the medial acetabular rim.  We then began reaming under direct visualization and direct fluoroscopy from a size 43 reamer in stepwise increments up to a size 51 with all reamers under  direct visualization, the last reamer under direct fluoroscopy, so I could obtain my depth of reaming, my inclination and anteversion.  Once I was pleased with this, I placed the real DePuy Sector Gription acetabular component size 52 and a 36 +0 polyethylene liner for that size acetabular component.  Attention was then turned to the femur.  With the leg externally rotated to 120 degrees, extended and adducted, we were able to place a Mueller retractor medially and a Hohmann retractor behind the greater trochanter.  I released the lateral joint capsule and used a box cutting osteotome to enter the femoral canal and a rongeur to lateralize.  We then began broaching from a size 8 broach going up to a size 11 in stepwise increments.  The size 10 was able to bury easily, but the size 11 due to bowing of the femur grabbed certainly higher, but I felt more comfortable going with the 10 than the 11 due to the nature of her bone and her age.  We then trialed then a standard offset femoral neck and a 36 -2 hip ball.  We reduced this in the acetabulum and it was stable, and we had actually increased her leg length as well, which we wanted to do.  We then dislocated the hip and removed the trial components.  We were able to place the real Corail femoral component size 11 with standard offset, the real 36 -2 metal hip ball and reduced this in the acetabulum, and again, I appreciate the stability and leg length.  The range of motion was stable as well.  We irrigated the hip then with normal saline solution using pulsatile lavage.  We were able to close the joint capsule tightly with interrupted #1 Ethibond suture followed by running #1 Vicryl in the tensor fascia, 0 Vicryl in the deep tissue, 2-0 Vicryl in the subcutaneous tissue, interrupted staples on the skin.  Xeroform and well-padded sterile dressing were applied.  She was taken off the Hana table and taken to the recovery room in  stable condition.  All final counts were correct.  There were no complications noted.  Of note, Richardean Canal, PA-C assisted during the entire case. His assistance was crucial for facilitating all aspects of this case.     Vanita Panda. Magnus Ivan, M.D.     CYB/MEDQ  D:  05/24/2017  T:  05/24/2017  Job:  [id number]

## 2017-05-25 NOTE — Progress Notes (Signed)
Patient ID: Avis EpleyRegina Nelson, female   DOB: 09-17-1945, 72 y.o.   MRN: [id number] Based on slow mobility and safety issues, the patient would benefit from short-term skilled nursing post this hospital stay.

## 2017-05-25 NOTE — Progress Notes (Signed)
Patient ID: Dana EpleyRegina Nelson, female   DOB: 1946/01/17, 72 y.o.   MRN: [id number] Doing very well post-op.  Her vitals are stable.  Her hgb is 7.7, but only down from 9.1 pre-op.  She has a history of significantly chronic anemia.  Although she is asymptomatic, she will likely experience a component of acute blood loss anemia.  I spoke with her about a transfusion and she is not opposed to this at all.  Please still get her up with therapy today.

## 2017-05-25 NOTE — Discharge Instructions (Addendum)

## 2017-05-26 LAB — BPAM RBC
Blood Product Expiration Date: 201905102359
Blood Product Expiration Date: 201905102359
ISSUE DATE / TIME: 201904171403
ISSUE DATE / TIME: 201904170938
UNIT TYPE AND RH: 5100
Unit Type and Rh: 5100

## 2017-05-26 LAB — TYPE AND SCREEN
ABO/RH(D): O POS
Antibody Screen: NEGATIVE
UNIT DIVISION: 0
Unit division: 0

## 2017-05-26 LAB — CBC
HEMATOCRIT: 29.6 % — AB (ref 36.0–46.0)
HEMOGLOBIN: 9.6 g/dL — AB (ref 12.0–15.0)
MCH: 24.1 pg — ABNORMAL LOW (ref 26.0–34.0)
MCHC: 32.4 g/dL (ref 30.0–36.0)
MCV: 74.2 fL — ABNORMAL LOW (ref 78.0–100.0)
Platelets: 340 10*3/uL (ref 150–400)
RBC: 3.99 MIL/uL (ref 3.87–5.11)
RDW: 20.4 % — ABNORMAL HIGH (ref 11.5–15.5)
WBC: 11.7 10*3/uL — AB (ref 4.0–10.5)

## 2017-05-26 NOTE — Progress Notes (Signed)
Occupational Therapy Treatment Patient Details Name: Dana EpleyRegina Nelson MRN: [id number] DOB: January 06, 1946 Today's Date: 05/26/2017    History of present illness Pt s/p R direct THA with PMH HTN, osteorarthritis, and anemia.    OT comments  Pt progressing towards OT goals, presents sitting up in recliner agreeable to OT tx session. Pt requiring ModA+2 for sit<>stand and to take small pivotal steps to EOB using RW; pt requiring increased time/effort to advance LEs during transfer. Continues to require max-total assist for LB ADLs. Pt's family present and discussion held regarding d/c recommendations and follow up therapies. Due to pt's current assist level feel pt will benfit from SNF level therapies prior to return home to maximize her safety and independence with ADLs and mobility, family in agreement. Will continue to follow acutely to progress pt towards established OT goals.   Follow Up Recommendations  Follow surgeon's recommendation for DC plan and follow-up therapies;SNF    Equipment Recommendations  Other (comment)(TBD in next venue )          Precautions / Restrictions Precautions Precautions: Fall Restrictions Weight Bearing Restrictions: Yes RLE Weight Bearing: Weight bearing as tolerated       Mobility Bed Mobility Overal bed mobility: Needs Assistance Bed Mobility: Sit to Supine     Supine to sit: +2 for safety/equipment;+2 for physical assistance;Max assist Sit to supine: Mod assist;+2 for safety/equipment;+2 for physical assistance   General bed mobility comments: mod A to assist surgical leg over EOB and to guide UB into supine; assist to boost UB further onto EOB to allow for adequate room to advance LEs onto bed   Transfers Overall transfer level: Needs assistance Equipment used: Rolling walker (2 wheeled) Transfers: Sit to/from Stand Sit to Stand: +2 physical assistance;+2 safety/equipment;Mod assist         General transfer comment: Mod A x2 to help power  up and stabilize in RW. patient with flexed trunk posture     Balance Overall balance assessment: Needs assistance   Sitting balance-Leahy Scale: Fair       Standing balance-Leahy Scale: Zero                             ADL either performed or assessed with clinical judgement   ADL Overall ADL's : Needs assistance/impaired                     Lower Body Dressing: +2 for safety/equipment;+2 for physical assistance;Total assistance;Sit to/from stand Lower Body Dressing Details (indicate cue type and reason): pt requiring total assist to don socks this session              Functional mobility during ADLs: Moderate assistance;+2 for physical assistance;+2 for safety/equipment;Rolling walker General ADL Comments: pt requesting to return to bed from recliner; ModA+2 forsit<>stand and pivotal steps to transfer back to EOB, requiring increased time/effort as well as increased time to return to supine and [facility]; family present and discussion held, eduated family regarding benefits and safety of SNF level therapy vs HH                        Cognition Arousal/Alertness: Awake/alert Behavior During Therapy: WFL for tasks assessed/performed Overall Cognitive Status: Within Functional Limits for tasks assessed  Exercises Exercises: General Lower Extremity          General Comments family present during session     Pertinent Vitals/ Pain       Pain Assessment: Faces Faces Pain Scale: Hurts little more Pain Location: bilateral shoulder with reaching, R LE.  Pain Descriptors / Indicators: Aching Pain Intervention(s): Monitored during session;Limited activity within patient's tolerance;Repositioned;Premedicated before session                                                          Frequency  Min 2X/week        Progress Toward Goals  OT Goals(current  goals can now be found in the care plan section)  Progress towards OT goals: Progressing toward goals  Acute Rehab OT Goals Patient Stated Goal: return home with care from sisters OT Goal Formulation: With patient Time For Goal Achievement: 06/01/17 Potential to Achieve Goals: Good  Plan Discharge plan needs to be updated    Co-evaluation    PT/OT/SLP Co-Evaluation/Treatment: Yes Reason for Co-Treatment: For patient/therapist safety PT goals addressed during session: Mobility/safety with mobility;Proper use of DME;Balance OT goals addressed during session: ADL's and self-care;Proper use of Adaptive equipment and DME      AM-PAC PT "6 Clicks" Daily Activity     Outcome Measure   Help from another person eating meals?: None Help from another person taking care of personal grooming?: A Little Help from another person toileting, which includes using toliet, bedpan, or urinal?: A Lot Help from another person bathing (including washing, rinsing, drying)?: A Lot Help from another person to put on and taking off regular upper body clothing?: A Little Help from another person to put on and taking off regular lower body clothing?: A Lot 6 Click Score: 16    End of Session Equipment Utilized During Treatment: Gait belt;Rolling walker  OT Visit Diagnosis: Unsteadiness on feet (R26.81);Muscle weakness (generalized) (M62.81)   Activity Tolerance Patient tolerated treatment well   Patient Left in bed;with call bell/phone within reach;with bed alarm set;with family/visitor present   Nurse Communication Mobility status        Time: 1610-9604 OT Time Calculation (min): 29 min  Charges: OT General Charges $OT Visit: 1 Visit OT Treatments $Therapeutic Activity: 8-22 mins  Marcy Siren, [place] Pager [phone] 05/26/2017    Orlando Penner 05/26/2017, 3:38 PM

## 2017-05-26 NOTE — Progress Notes (Signed)
Subjective: 2 Days Post-Op Procedure(s) (LRB): RIGHT TOTAL HIP ARTHROPLASTY ANTERIOR APPROACH (Right) Patient reports pain as moderate.  Responded well to transfusion yesterday.  Objective: Vital signs in last 24 hours: Temp:  [98.1 F (36.7 C)-99.5 F (37.5 C)] 98.1 F (36.7 C) (04/18 0559) Pulse Rate:  [87-105] 105 (04/18 0559) Resp:  [14-16] 14 (04/17 1700) BP: (117-178)/(64-86) 178/78 (04/18 0559) SpO2:  [95 %-99 %] 98 % (04/18 0559)  Intake/Output from previous day: 04/17 0701 - 04/18 0700 In: 2897.3 [I.V.:1833.8; Blood:1063.5] Out: 825 [Urine:825] Intake/Output this shift: No intake/output data recorded.  Recent Labs    05/25/17 0522 05/26/17 0627  HGB 7.7* 9.6*   Recent Labs    05/25/17 0522 05/26/17 0627  WBC 9.4 11.7*  RBC 3.67* 3.99  HCT 25.9* 29.6*  PLT 446* 340   Recent Labs    05/25/17 0522  NA 136  K 4.5  CL 104  CO2 22  BUN 12  CREATININE 0.59  GLUCOSE 114*  CALCIUM 9.4   No results for input(s): LABPT, INR in the last 72 hours.  Sensation intact distally Intact pulses distally Dorsiflexion/Plantar flexion intact Incision: dressing C/D/I No cellulitis present Compartment soft   Assessment/Plan: 2 Days Post-Op Procedure(s) (LRB): RIGHT TOTAL HIP ARTHROPLASTY ANTERIOR APPROACH (Right) Up with therapy Plan for discharge tomorrow Discharge to SNF    Dana Nelson 05/26/2017, 8:36 AM

## 2017-05-26 NOTE — Progress Notes (Signed)
Physical Therapy Treatment Patient Details Name: Dana Nelson MRN: [id number] DOB: October 27, 1945 Today's Date: 05/26/2017    History of Present Illness Pt s/p R direct THA with PMH HTN, osteorarthritis, and anemia.     PT Comments    Family present this afternoon, discussed updated recs for SNF and assisted patient with transferring back into bed. Pt able to progress to short steps inside hospital room with poor mechanics and +2 physical assistance required. Family and pt agreeable for SNF.     Follow Up Recommendations  Follow surgeon's recommendation for DC plan and follow-up therapies;SNF;Supervision/Assistance - 24 hour     Equipment Recommendations  None recommended by PT    Recommendations for Other Services       Precautions / Restrictions Precautions Precautions: Fall Restrictions Weight Bearing Restrictions: Yes RLE Weight Bearing: Weight bearing as tolerated    Mobility  Bed Mobility Overal bed mobility: Needs Assistance Bed Mobility: Sit to Supine     Supine to sit: +2 for safety/equipment;+2 for physical assistance;Max assist Sit to supine: Mod assist   General bed mobility comments: mod A to assist surgical leg over EOB  Transfers Overall transfer level: Needs assistance Equipment used: Rolling walker (2 wheeled) Transfers: Sit to/from Stand Sit to Stand: +2 physical assistance;+2 safety/equipment;Mod assist         General transfer comment: Mod A x2 to help power up and stabilize in RW. patient with flexed trunk posture   Ambulation/Gait Ambulation/Gait assistance: Mod assist;+2 physical assistance Ambulation Distance (Feet): 3 Feet Assistive device: Rolling walker (2 wheeled) Gait Pattern/deviations: Step-to pattern Gait velocity: decreased   General Gait Details: patient with several short steps in hospital room today. narrow base of support and fear of falling despite encourgment from PT/OT.    Stairs             Wheelchair  Mobility    Modified Rankin (Stroke Patients Only)       Balance Overall balance assessment: Needs assistance   Sitting balance-Leahy Scale: Fair       Standing balance-Leahy Scale: Zero                              Cognition Arousal/Alertness: Awake/alert Behavior During Therapy: WFL for tasks assessed/performed Overall Cognitive Status: Within Functional Limits for tasks assessed                                        Exercises      General Comments        Pertinent Vitals/Pain Pain Assessment: Faces Faces Pain Scale: Hurts little more Pain Location: bilateral shoulder with reaching, R LE.  Pain Descriptors / Indicators: Aching Pain Intervention(s): Limited activity within patient's tolerance;Monitored during session;Premedicated before session;Repositioned    Home Living                      Prior Function            PT Goals (current goals can now be found in the care plan section) Acute Rehab PT Goals Patient Stated Goal: return home with care from sisters PT Goal Formulation: With patient Time For Goal Achievement: 06/01/17 Potential to Achieve Goals: Fair Progress towards PT goals: Progressing toward goals    Frequency    7X/week      PT Plan Discharge plan needs to be  updated    Co-evaluation PT/OT/SLP Co-Evaluation/Treatment: Yes Reason for Co-Treatment: For patient/therapist safety PT goals addressed during session: Mobility/safety with mobility;Proper use of DME;Balance        AM-PAC PT "6 Clicks" Daily Activity  Outcome Measure  Difficulty turning over in bed (including adjusting bedclothes, sheets and blankets)?: Unable Difficulty moving from lying on back to sitting on the side of the bed? : Unable Difficulty sitting down on and standing up from a chair with arms (e.g., wheelchair, bedside commode, etc,.)?: Unable Help needed moving to and from a bed to chair (including a wheelchair)?: A  Lot Help needed walking in hospital room?: Total Help needed climbing 3-5 steps with a railing? : Total 6 Click Score: 7    End of Session Equipment Utilized During Treatment: Gait belt Activity Tolerance: Patient tolerated treatment well Patient left: in chair;with call bell/phone within reach Nurse Communication: Mobility status PT Visit Diagnosis: Unsteadiness on feet (R26.81);Other abnormalities of gait and mobility (R26.89);Pain;Difficulty in walking, not elsewhere classified (R26.2) Pain - Right/Left: Right Pain - part of body: Shoulder;Hip     Time: [id number] PT Time Calculation (min) (ACUTE ONLY): 29 min  Charges:  $Therapeutic Activity: 8-22 mins                    G Codes:       Etta GrandchildSean Olanda Boughner, PT, DPT Acute Rehab Services Pager: [phone]    Etta GrandchildSean  Jourdin Connors 05/26/2017, 2:56 PM

## 2017-05-26 NOTE — Progress Notes (Signed)
Physical Therapy Treatment Patient Details Name: Dana EpleyRegina Colomb MRN: [id number] DOB: [birth year] Today's Date: 05/26/2017    History of Present Illness Pt s/p R direct THA with PMH HTN, osteorarthritis, and anemia.     PT Comments    Patient demonstring progress this visit with bed mobility and transfers, now mod A for mobility. Unable to progress to ambulation, family not present to discuss safe handling at home and pt reporting that sisters are both in fragile health. Updating recommendations for SNF placement as patient is a fall risk reliant on physical assistance for functional mobility at this time.     Follow Up Recommendations  Follow surgeon's recommendation for DC plan and follow-up therapies;SNF;Supervision/Assistance - 24 hour     Equipment Recommendations  None recommended by PT    Recommendations for Other Services       Precautions / Restrictions Precautions Precautions: Fall Restrictions Weight Bearing Restrictions: Yes    Mobility  Bed Mobility Overal bed mobility: Needs Assistance Bed Mobility: Sit to Supine       Sit to supine: Mod assist   General bed mobility comments: mod A to assist surgical leg over EOB  Transfers Overall transfer level: Needs assistance Equipment used: Rolling walker (2 wheeled) Transfers: Sit to/from Stand Sit to Stand: +2 physical assistance;+2 safety/equipment;Mod assist         General transfer comment: Mod A x2   Ambulation/Gait             General Gait Details: unable   Stairs             Wheelchair Mobility    Modified Rankin (Stroke Patients Only)       Balance Overall balance assessment: Needs assistance   Sitting balance-Leahy Scale: Fair       Standing balance-Leahy Scale: Zero                              Cognition Arousal/Alertness: Awake/alert Behavior During Therapy: WFL for tasks assessed/performed Overall Cognitive Status: Within Functional Limits for tasks  assessed                                        Exercises      General Comments        Pertinent Vitals/Pain Pain Assessment: Faces Faces Pain Scale: Hurts little more Pain Location: bilateral shoulder with reaching, R LE.  Pain Descriptors / Indicators: Aching Pain Intervention(s): Limited activity within patient's tolerance;Monitored during session;Premedicated before session;Repositioned    Home Living                      Prior Function            PT Goals (current goals can now be found in the care plan section) Acute Rehab PT Goals Patient Stated Goal: return home with care from sisters PT Goal Formulation: With patient Time For Goal Achievement: 06/01/17 Potential to Achieve Goals: Fair Progress towards PT goals: Progressing toward goals    Frequency    7X/week      PT Plan Discharge plan needs to be updated    Co-evaluation              AM-PAC PT "6 Clicks" Daily Activity  Outcome Measure  Difficulty turning over in bed (including adjusting bedclothes, sheets and blankets)?: Unable Difficulty moving from lying  on back to sitting on the side of the bed? : Unable Difficulty sitting down on and standing up from a chair with arms (e.g., wheelchair, bedside commode, etc,.)?: Unable Help needed moving to and from a bed to chair (including a wheelchair)?: A Lot Help needed walking in hospital room?: Total Help needed climbing 3-5 steps with a railing? : Total 6 Click Score: 7    End of Session Equipment Utilized During Treatment: Gait belt Activity Tolerance: Patient tolerated treatment well Patient left: in chair;with call bell/phone within reach Nurse Communication: Mobility status PT Visit Diagnosis: Unsteadiness on feet (R26.81);Other abnormalities of gait and mobility (R26.89);Pain;Difficulty in walking, not elsewhere classified (R26.2) Pain - Right/Left: Right Pain - part of body: Shoulder;Hip     Time:  1030-1057(increased time for tolieting ) PT Time Calculation (min) (ACUTE ONLY): 27 min  Charges:  $Therapeutic Activity: 8-22 mins                    G Codes:      Etta Grandchild, PT, DPT Acute Rehab Services Pager: [phone]     Etta Grandchild 05/26/2017, 10:58 AM

## 2017-05-27 MED ORDER — ASPIRIN 81 MG PO CHEW: 81.0000 mg | CHEWABLE_TABLET | Freq: Two times a day (BID) | ORAL | 0 refills | Status: AC

## 2017-05-27 MED ORDER — OXYCODONE HCL 5 MG PO TABS: 5.0000 mg | ORAL_TABLET | ORAL | 0 refills | Status: DC | PRN

## 2017-05-27 MED ORDER — METHOCARBAMOL 500 MG PO TABS: 500.0000 mg | ORAL_TABLET | Freq: Four times a day (QID) | ORAL | 0 refills | Status: DC | PRN

## 2017-05-27 NOTE — Clinical Social Work Placement (Deleted)
   CLINICAL SOCIAL WORK PLACEMENT  NOTE  Date:  05/27/2017  Patient Details  Name: Dana Nelson MRN: [id number] Date of Birth: [birth year]  Clinical Social Work is seeking post-discharge placement for this patient at the Skilled  Nursing Facility level of care (*CSW will initial, date and re-position this form in  chart as items are completed):      Patient/family provided with [facility] Clinical Social Work Department's list of facilities offering this level of care within the geographic area requested by the patient (or if unable, by the patient's family).  Yes   Patient/family informed of their freedom to choose among providers that offer the needed level of care, that participate in Medicare, Medicaid or managed care program needed by the patient, have an available bed and are willing to accept the patient.      Patient/family informed of [facility]'s ownership interest in [facility] and [facility], as well as of the fact that they are under no obligation to receive care at these facilities.  PASRR submitted to EDS on       PASRR number received on       Existing PASRR number confirmed on 05/27/17     FL2 transmitted to all facilities in geographic area requested by pt/family on 05/27/17     FL2 transmitted to all facilities within larger geographic area on       Patient informed that his/her managed care company has contracts with or will negotiate with certain facilities, including the following:        Yes   Patient/family informed of bed offers received.  Patient chooses bed at [facility]     Physician recommends and patient chooses bed at      [facility] on 05/27/17.  Patient to be transferred to facility by [facility]     Patient family notified on 05/27/17 of transfer.  Name of family member notified:  Dana Nelson, sister     PHYSICIAN       Additional Comment:     _______________________________________________ Maree KrabbeBridget A Lorenza Shakir, LCSW 05/27/2017, 1:57 PM

## 2017-05-27 NOTE — Clinical Social Work Note (Signed)
Clinical Social Worker facilitated patient discharge including contacting patient family and facility to confirm patient discharge plans.  Clinical information faxed to facility and family agreeable with plan.  CSW arranged ambulance transport via [facility] to [facility] .  RN to call [phone] for report prior to discharge.  Clinical Social Worker will sign off for now as social work intervention is no longer needed. Please consult us again if new need arises.  Aloura Matsuoka, LCSWA [phone]   

## 2017-05-27 NOTE — Discharge Summary (Signed)
Patient ID: Dana EpleyRegina Rogalski MRN: [id number] DOB/AGE: [birth year] 70 y.o.  Admit date: 05/24/2017 Discharge date: 05/27/2017  Admission Diagnoses:  Principal Problem:   Unilateral primary osteoarthritis, right hip Active Problems:   Status post total replacement of right hip   Discharge Diagnoses:  Same  Past Medical History:  Diagnosis Date  . Acute blood loss anemia   . Angioedema 2018  . Asthma    not current  . AVM (arteriovenous malformation) of small bowel, acquired    Jejunum - capsule endo 09/2016  . DVT (deep venous thrombosis) (HCC) 06/2016  . Dyspnea    with exertion  . Eczema   . Eczema   . GERD (gastroesophageal reflux disease) 08/17/2016  . GI bleed 12/04/2016  . Gout   . History of blood transfusion 11/2016   GI bleed  . Hyperlipemia   . Hypertension   . Hypothyroidism   . Iron deficiency anemia   . Lip swelling 08/2016  . Melena 12/04/2016  . Open-angle glaucoma   . Osteoarthritis   . Pre-diabetes    05/13/17- "Dr said she will probably stop it"  . Right hip pain 12/07/2016  . Right knee pain 12/07/2016  . Vitamin D deficiency     Surgeries: Procedure(s): RIGHT TOTAL HIP ARTHROPLASTY ANTERIOR APPROACH on 05/24/2017   Consultants:   Discharged Condition: Improved  Hospital Course: Dana EpleyRegina Kanan is an 72 y.o. female who was admitted 05/24/2017 for operative treatment ofUnilateral primary osteoarthritis, right hip. Patient has severe unremitting pain that affects sleep, daily activities, and work/hobbies. After pre-op clearance the patient was taken to the operating room on 05/24/2017 and underwent  Procedure(s): RIGHT TOTAL HIP ARTHROPLASTY ANTERIOR APPROACH.    Patient was given perioperative antibiotics:  Anti-infectives (From admission, onward)   Start     Dose/Rate Route Frequency Ordered Stop   05/24/17 1845  clindamycin (CLEOCIN) IVPB 600 mg     600 mg 100 mL/hr over 30 Minutes Intravenous Every 6 hours 05/24/17 1811 05/25/17 0644   05/24/17  1100  clindamycin (CLEOCIN) IVPB 900 mg     900 mg 100 mL/hr over 30 Minutes Intravenous On call to O.R. 05/24/17 1021 05/24/17 1300   05/24/17 1023  clindamycin (CLEOCIN) 900 MG/50ML IVPB    Note to Pharmacy:  Marrianne MoodAltman, Deborah   : cabinet override      05/24/17 1023 05/24/17 1230       Patient was given sequential compression devices, early ambulation, and chemoprophylaxis to prevent DVT.  Patient benefited maximally from hospital stay and there were no complications.    Recent vital signs:  Patient Vitals for the past 24 hrs:  BP Temp Temp src Pulse Resp SpO2  05/27/17 0539 136/79 98.7 F (37.1 C) Oral 89 - 99 %  05/26/17 2032 (!) 150/77 (!) 101 F (38.3 C) Oral 97 16 97 %  05/26/17 1336 139/72 (!) 100.4 F (38 C) Oral 94 16 98 %     Recent laboratory studies:  Recent Labs    05/25/17 0522 05/26/17 0627  WBC 9.4 11.7*  HGB 7.7* 9.6*  HCT 25.9* 29.6*  PLT 446* 340  NA 136  --   K 4.5  --   CL 104  --   CO2 22  --   BUN 12  --   CREATININE 0.59  --   GLUCOSE 114*  --   CALCIUM 9.4  --      Discharge Medications:   Allergies as of 05/27/2017      Reactions  Latex Swelling   Eggs Or Egg-derived Products Other (See Comments)   Reported by [facility] 11/24/11 - unknown reaction   Losartan Swelling   Swelling of lips and face   Other Other (See Comments)   Hives and facial swelling from all nuts (tree nuts and peanuts)   Peanut-containing Drug Products Hives, Swelling   Facial swelling   Penicillins Other (See Comments)   Childhood allergy- convulstions Has patient had a PCN reaction causing immediate rash, facial/tongue/throat swelling, SOB or lightheadedness with hypotension: Yes Has patient had a PCN reaction causing severe rash involving mucus membranes or skin necrosis: No Has patient had a PCN reaction that required hospitalization: Yes Has patient had a PCN reaction occurring within the last 10 years: No If all of the above answers are "NO", then may  proceed with Cephalosporin use.      Medication List    STOP taking these medications   aspirin EC 81 MG tablet Replaced by:  aspirin 81 MG chewable tablet   diclofenac sodium 1 % Gel Commonly known as:  VOLTAREN     TAKE these medications   allopurinol 100 MG tablet Commonly known as:  ZYLOPRIM Take 100 mg daily by mouth.   amLODipine 5 MG tablet Commonly known as:  NORVASC Take 5 mg by mouth daily.   aspirin 81 MG chewable tablet Commonly known as:  ASPIRIN CHILDRENS Chew 1 tablet (81 mg total) by mouth 2 (two) times daily after a meal. Replaces:  aspirin EC 81 MG tablet   famotidine 40 MG tablet Commonly known as:  PEPCID Take 1 tablet (40 mg total) by mouth daily.   ferrous sulfate 325 (65 FE) MG tablet Take 1 tablet (325 mg total) by mouth daily. What changed:  when to take this   hydrALAZINE 25 MG tablet Commonly known as:  APRESOLINE Take 25 mg by mouth 2 (two) times daily.   levothyroxine 112 MCG tablet Commonly known as:  SYNTHROID, LEVOTHROID Take 112 mcg by mouth daily before lunch.   metFORMIN 750 MG 24 hr tablet Commonly known as:  GLUCOPHAGE-XR Take 750 mg by mouth 2 (two) times daily.   methocarbamol 500 MG tablet Commonly known as:  ROBAXIN Take 1 tablet (500 mg total) by mouth every 6 (six) hours as needed for muscle spasms.   metoprolol succinate 50 MG 24 hr tablet Commonly known as:  TOPROL-XL Take 50 mg by mouth daily. Take with or immediately following a meal.   oxyCODONE 5 MG immediate release tablet Commonly known as:  Oxy IR/ROXICODONE Take 1-2 tablets (5-10 mg total) by mouth every 4 (four) hours as needed for moderate pain (pain score 4-6).   pravastatin 20 MG tablet Commonly known as:  PRAVACHOL Take 20 mg by mouth at bedtime.   VENTOLIN HFA 108 (90 Base) MCG/ACT inhaler Generic drug:  albuterol Inhale 2 puffs into the lungs every 6 (six) hours as needed for wheezing.       Diagnostic Studies: Dg Pelvis  Portable  Result Date: 05/24/2017 CLINICAL DATA:  Right hip replacement. EXAM: PORTABLE PELVIS 1-2 VIEWS COMPARISON:  05/24/2017. FINDINGS: Total right hip replacement. Anatomic alignment. Hardware intact. Severe degenerative changes left hip. Peripheral vascular calcification. IMPRESSION: Total right hip replacement with anatomic alignment. Electronically Signed   By: Maisie Fus  Register   On: 05/24/2017 14:26   Dg C-arm 1-60 Min  Result Date: 05/24/2017 CLINICAL DATA:  Status post arthroplasty EXAM: OPERATIVE RIGHT HIP   2 VIEWS TECHNIQUE: Fluoroscopic spot image(s) were submitted  for interpretation post-operatively. 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 There is marked narrowing of the right hip joint. Subsequently, frontal view reveals total hip replacement on the right with prosthetic components well-seated. No acute fracture or dislocation. No erosive change. 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 Subsequent total hip replacement right with prosthetic components well-seated. No acute fracture or dislocation on frontal view. Electronically Signed   By: Bretta Bang III M.D.   On: 05/24/2017 14:11   Dg Hip Operative Unilat W Or W/o Pelvis Right  Result Date: 05/24/2017 CLINICAL DATA:  Status post arthroplasty EXAM: OPERATIVE RIGHT HIP   2 VIEWS TECHNIQUE: Fluoroscopic spot image(s) were submitted for interpretation post-operatively. 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 There is marked narrowing of the right hip joint. Subsequently, frontal view reveals total hip replacement on the right with prosthetic  components well-seated. No acute fracture or dislocation. No erosive change. 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 Subsequent total hip replacement right with prosthetic components well-seated. No acute fracture or dislocation on frontal view. Electronically Signed   By: Bretta Bang III M.D.   On: 05/24/2017 14:11    Disposition: Discharge disposition: 03-Skilled Nursing Facility         Follow-up Information    Kathryne Hitch, MD Follow up in 2 week(s).   Specialty:  Orthopedic Surgery Contact information: [address] [place] [address] [phone]            Signed: Kathryne Hitch 05/27/2017, 6:46 AM

## 2017-05-27 NOTE — Progress Notes (Signed)
Physical Therapy Treatment Patient Details Name: Dana EpleyRegina Kyne MRN: [id number] DOB: November 11, 1945 Today's Date: 05/27/2017    History of Present Illness Pt s/p R direct THA with PMH HTN, osteorarthritis, and anemia.     PT Comments    Pt showing progress towards goals as she was able to ambulate 20 ft in room today. Initially required mod A +2 for gait, however was able to transition to min A with chair to follow. Pt continues to be appropriate for d/c to SNF for rehab prior to return home.     Follow Up Recommendations  Follow surgeon's recommendation for DC plan and follow-up therapies;SNF;Supervision/Assistance - 24 hour     Equipment Recommendations  None recommended by PT    Recommendations for Other Services       Precautions / Restrictions Precautions Precautions: Fall Restrictions Weight Bearing Restrictions: Yes RLE Weight Bearing: Weight bearing as tolerated    Mobility  Bed Mobility Overal bed mobility: Needs Assistance             General bed mobility comments: in chair on arrival.  Transfers Overall transfer level: Needs assistance Equipment used: Rolling walker (2 wheeled) Transfers: Sit to/from Stand Sit to Stand: Min guard;+2 safety/equipment;+2 physical assistance         General transfer comment: Pt required min A +2 to power up into standing and steady.   Ambulation/Gait Ambulation/Gait assistance: Mod assist;Min assist;+2 safety/equipment Ambulation Distance (Feet): 20 Feet Assistive device: Rolling walker (2 wheeled) Gait Pattern/deviations: Step-to pattern Gait velocity: decreased   General Gait Details: Pt initially required mod A +2 for ambulation, however transitioned to min A with chair to follow. Assist given to steady and for RW management.   Stairs             Wheelchair Mobility    Modified Rankin (Stroke Patients Only)       Balance Overall balance assessment: Needs assistance   Sitting balance-Leahy Scale:  Fair       Standing balance-Leahy Scale: Poor Standing balance comment: reliant on RW and min A to steady                            Cognition Arousal/Alertness: Awake/alert Behavior During Therapy: WFL for tasks assessed/performed Overall Cognitive Status: Within Functional Limits for tasks assessed                                        Exercises      General Comments        Pertinent Vitals/Pain Pain Assessment: Faces Faces Pain Scale: Hurts a little bit Pain Location: bilateral shoulder with reaching, R LE.  Pain Descriptors / Indicators: Aching Pain Intervention(s): Monitored during session    Home Living                      Prior Function            PT Goals (current goals can now be found in the care plan section) Acute Rehab PT Goals Patient Stated Goal: return home with care from sisters PT Goal Formulation: With patient Time For Goal Achievement: 06/01/17 Potential to Achieve Goals: Fair Progress towards PT goals: Progressing toward goals    Frequency    7X/week      PT Plan Current plan remains appropriate    Co-evaluation  AM-PAC PT "6 Clicks" Daily Activity  Outcome Measure  Difficulty turning over in bed (including adjusting bedclothes, sheets and blankets)?: Unable Difficulty moving from lying on back to sitting on the side of the bed? : Unable Difficulty sitting down on and standing up from a chair with arms (e.g., wheelchair, bedside commode, etc,.)?: Unable   Help needed walking in hospital room?: A Little Help needed climbing 3-5 steps with a railing? : A Lot 6 Click Score: 8    End of Session Equipment Utilized During Treatment: Gait belt Activity Tolerance: Patient tolerated treatment well Patient left: in chair;with call bell/phone within reach;with chair alarm set Nurse Communication: Mobility status PT Visit Diagnosis: Unsteadiness on feet (R26.81);Other abnormalities  of gait and mobility (R26.89);Pain;Difficulty in walking, not elsewhere classified (R26.2) Pain - Right/Left: Right Pain - part of body: Shoulder;Hip     Time: 1610-9604 PT Time Calculation (min) (ACUTE ONLY): 15 min  Charges:                       G Codes:       Kallie Locks, PTA Pager [phone] Acute Rehab  Sheral Apley 05/27/2017, 10:07 AM

## 2017-05-27 NOTE — Progress Notes (Signed)
[facility] Patient was alert and  oriented prior to discharge, not in any distress.

## 2017-05-27 NOTE — Clinical Social Work Note (Signed)
Clinical Social Work Assessment  Patient Details  Name: Dana EpleyRegina Atkerson MRN: [id number] Date of Birth: 08-31-1945  Date of referral:  05/27/17               Reason for consult:  Facility Placement                Permission sought to share information with:  Family Supports Permission granted to share information::  Yes, Release of Information Signed  Name::     Pedro EarlsKim Weiher  [facility]  Relationship::  sister in Diplomatic Services operational officerlaw  Contact Information:  [phone]  Housing/Transportation Living arrangements for the past 2 months:  Single Family Home Source of Information:  Patient Patient Interpreter Needed:  None Criminal Activity/Legal Involvement Pertinent to Current Situation/Hospitalization:  No - Comment as needed Significant Relationships:  Other(Comment), Other Family Members(Grandson) Lives with:  Relatives(Grandson) Do you feel safe going back to the place where you live?  No Need for family participation in patient care:  No (Coment)  Care giving concerns:  Pt is alert and oriented x4. Pt was living independently home with her grandson prior to admission.   Social Worker assessment / plan:  CSW spoke with pt. Pt states she has been [facility] in the past and would like to go there. CSW will follow up with facility.   Employment status:  Retired Health and safety inspectornsurance information:  Medicare PT Recommendations:  Skilled Nursing Facility Information / Referral to community resources:  Skilled Nursing Facility  Patient/Family's Response to care:  Pt verbalized understanding of CSW role and expressed appreciation for support. Pt denies any concern regarding pt care at this time.  Patient/Family's Understanding of and Emotional Response to Diagnosis, Current Treatment, and Prognosis:  Pt understanding and realistic regarding physical limitations. Pt understands the need for SNF placement at d/c. Pt agreeable to SNF placement at d/c, at this time. Pt's responses emotionally appropriate during  conversation with CSW. Pt denies any concern regarding treatment plan at this time. CSW will continue to provide support and facilitate d/c needs.   Emotional Assessment Appearance:  Appears stated age Attitude/Demeanor/Rapport:  (Patient was appropriate) Affect (typically observed):  Accepting, Appropriate, Calm Orientation:  Oriented to Self, Oriented to Place, Oriented to  Time, Oriented to Situation Alcohol / Substance use:  Not Applicable Psych involvement (Current and /or in the community):  No (Comment)  Discharge Needs  Concerns to be addressed:  No discharge needs identified Readmission within the last 30 days:  No Current discharge risk:  None Barriers to Discharge:  No Barriers Identified   Manav Pierotti A Aislin Onofre, LCSW 05/27/2017, 2:19 PM

## 2017-05-27 NOTE — [place] FL2 (Signed)
[place] MEDICAID FL2 LEVEL OF CARE SCREENING TOOL     IDENTIFICATION  Patient Name: Dana Nelson Birthdate: [birth year] Sex: female Admission Date (Current Location): 05/24/2017  [facility] and [place] Number:  Producer, television/film/video and Address:  [facility], [address] [place], [place] [address]      Provider Number: [id number]  Attending Physician Name and Address:  Kathryne Hitch  Relative Name and Phone Number:       Current Level of Care: Hospital Recommended Level of Care: Skilled Nursing Facility Prior Approval Number:    Date Approved/Denied:   PASRR Number: [id number]  Discharge Plan: SNF    Current Diagnoses: Patient Active Problem List   Diagnosis Date Noted  . Status post total replacement of right hip 05/24/2017  . Unilateral primary osteoarthritis, right hip 04/19/2017  . Unilateral primary osteoarthritis, left hip 04/19/2017  . Bilateral primary osteoarthritis of hip 04/08/2017  . Chronic anticoagulation 12/27/2016  . Hypomagnesemia 12/27/2016  . Diabetes mellitus without complication (HCC) 12/18/2016  . Osteoarthritis 12/18/2016  . Right knee pain 12/07/2016  . Right hip pain 12/07/2016  . AVM (arteriovenous malformation) of small bowel, acquired   . Hypokalemia 12/04/2016  . Melena 12/04/2016  . GI bleed 12/04/2016  . Hematochezia   . Acute blood loss anemia   . Chronic iron deficiency anemia   . Angioedema 08/17/2016  . Blood loss anemia 08/17/2016  . DVT (deep venous thrombosis) (HCC) 08/17/2016  . HTN (hypertension) 08/17/2016  . Hyperlipidemia 08/17/2016  . Microcytic anemia 08/17/2016  . Hypothyroidism 08/17/2016  . GERD (gastroesophageal reflux disease) 08/17/2016    Orientation RESPIRATION BLADDER Height & Weight     Self, Time, Situation, Place  Normal Continent Weight: 164 lb (74.4 kg) Height:  5\' 3"  (160 cm)  BEHAVIORAL SYMPTOMS/MOOD NEUROLOGICAL BOWEL NUTRITION STATUS      Continent  Diet(carb modified, thin liquids)  AMBULATORY STATUS COMMUNICATION OF NEEDS Skin   Limited Assist Verbally Surgical wounds(Closed incision right hip, silver dressings)                       Personal Care Assistance Level of Assistance  Bathing, Feeding, Dressing Bathing Assistance: Limited assistance Feeding assistance: Independent Dressing Assistance: Limited assistance     Functional Limitations Info  Sight, Hearing, Speech Sight Info: Adequate Hearing Info: Adequate Speech Info: Adequate    SPECIAL CARE FACTORS FREQUENCY  PT (By licensed PT), OT (By licensed OT)     PT Frequency: 7x OT Frequency: 7x            Contractures Contractures Info: Not present    Additional Factors Info  Code Status, Allergies Code Status Info: Full Code Allergies Info: Latex, Eggs Or Egg-derived Products, Losartan, Other, Peanut-containing Drug Products, Penicillins           Current Medications (05/27/2017):  This is the current hospital active medication list Current Facility-Administered Medications  Medication Dose Route Frequency Provider Last Rate Last Dose  . 0.9 %  sodium chloride infusion   Intravenous Continuous Kathryne Hitch, MD 75 mL/hr at 05/24/17 1433    . acetaminophen (TYLENOL) tablet 325-650 mg  325-650 mg Oral Q6H PRN Kathryne Hitch, MD   650 mg at 05/26/17 0700  . albuterol (PROVENTIL) (2.5 MG/3ML) 0.083% nebulizer solution 3 mL  3 mL Inhalation Q6H PRN Kathryne Hitch, MD      . allopurinol (ZYLOPRIM) tablet 100 mg  100 mg Oral Daily Doneen Poisson  Y, MD   100 mg at 05/27/17 1011  . alum & mag hydroxide-simeth (MAALOX/MYLANTA) 200-200-20 MG/5ML suspension 30 mL  30 mL Oral Q4H PRN Kathryne HitchBlackman, Christopher Y, MD      . amLODipine (NORVASC) tablet 5 mg  5 mg Oral Daily Kathryne HitchBlackman, Christopher Y, MD   5 mg at 05/27/17 1011  . diphenhydrAMINE (BENADRYL) 12.5 MG/5ML elixir 12.5-25 mg  12.5-25 mg Oral Q4H PRN Kathryne HitchBlackman, Christopher Y, MD       . docusate sodium (COLACE) capsule 100 mg  100 mg Oral BID Kathryne HitchBlackman, Christopher Y, MD   100 mg at 05/27/17 1011  . ferrous sulfate tablet 325 mg  325 mg Oral Q1400 Kathryne HitchBlackman, Christopher Y, MD   325 mg at 05/26/17 1414  . hydrALAZINE (APRESOLINE) tablet 25 mg  25 mg Oral BID Kathryne HitchBlackman, Christopher Y, MD   25 mg at 05/27/17 1012  . HYDROmorphone (DILAUDID) injection 0.5-1 mg  0.5-1 mg Intravenous Q4H PRN Kathryne HitchBlackman, Christopher Y, MD   1 mg at 05/25/17 1801  . levothyroxine (SYNTHROID, LEVOTHROID) tablet 112 mcg  112 mcg Oral QAC breakfast Kathryne HitchBlackman, Christopher Y, MD   112 mcg at 05/27/17 0743  . menthol-cetylpyridinium (CEPACOL) lozenge 3 mg  1 lozenge Oral PRN Kathryne HitchBlackman, Christopher Y, MD       Or  . phenol (CHLORASEPTIC) mouth spray 1 spray  1 spray Mouth/Throat PRN Kathryne HitchBlackman, Christopher Y, MD      . metFORMIN (GLUCOPHAGE-XR) 24 hr tablet 750 mg  750 mg Oral BID WC Kathryne HitchBlackman, Christopher Y, MD   750 mg at 05/27/17 0801  . methocarbamol (ROBAXIN) tablet 500 mg  500 mg Oral Q6H PRN Kathryne HitchBlackman, Christopher Y, MD   500 mg at 05/25/17 0615   Or  . methocarbamol (ROBAXIN) 500 mg in dextrose 5 % 50 mL IVPB  500 mg Intravenous Q6H PRN Kathryne HitchBlackman, Christopher Y, MD      . metoCLOPramide (REGLAN) tablet 5-10 mg  5-10 mg Oral Q8H PRN Kathryne HitchBlackman, Christopher Y, MD       Or  . metoCLOPramide (REGLAN) injection 5-10 mg  5-10 mg Intravenous Q8H PRN Kathryne HitchBlackman, Christopher Y, MD      . metoprolol succinate (TOPROL-XL) 24 hr tablet 50 mg  50 mg Oral QPC breakfast Kathryne HitchBlackman, Christopher Y, MD   50 mg at 05/27/17 1012  . ondansetron (ZOFRAN) tablet 4 mg  4 mg Oral Q6H PRN Kathryne HitchBlackman, Christopher Y, MD       Or  . ondansetron [facility](ZOFRAN) injection 4 mg  4 mg Intravenous Q6H PRN Kathryne HitchBlackman, Christopher Y, MD      . oxyCODONE (Oxy IR/ROXICODONE) immediate release tablet 10-15 mg  10-15 mg Oral Q4H PRN Kathryne HitchBlackman, Christopher Y, MD   15 mg at 05/25/17 0615  . oxyCODONE (Oxy IR/ROXICODONE) immediate release tablet 5-10 mg  5-10 mg Oral  Q4H PRN Kathryne HitchBlackman, Christopher Y, MD   10 mg at 05/24/17 1716  . pantoprazole (PROTONIX) EC tablet 40 mg  40 mg Oral Daily Kathryne HitchBlackman, Christopher Y, MD   40 mg at 05/27/17 1011  . pravastatin (PRAVACHOL) tablet 20 mg  20 mg Oral QHS Kathryne HitchBlackman, Christopher Y, MD   20 mg at 05/26/17 2239  . rivaroxaban (XARELTO) tablet 10 mg  10 mg Oral Q breakfast Kathryne HitchBlackman, Christopher Y, MD   10 mg at 05/27/17 0800     Discharge Medications: Please see discharge summary for a list of discharge medications.  Relevant Imaging Results:  Relevant Lab Results:   Additional Information SSN: [id number]  Clarisse GougeBridget  A Hazelgrace Bonham, LCSW

## 2017-05-27 NOTE — Care Management Note (Signed)
Case Management Note  Patient Details  Name: Avis EpleyRegina Coba MRN: [id number] Date of Birth: [birth year]  Subjective/Objective:   72 yr old female s/p right total hip arthroplasty.                   Action/Plan:  Case manager spoke with patient concerning discharge plan. Patient says she is going to rehab as discussed with her Dr. And therapist. [facility]. Case manager contacted Social worker and informed her of this.    Expected Discharge Date:  05/27/17               Expected Discharge Plan:  Skilled Nursing Facility  In-House Referral:  NA  Discharge planning Services  CM Consult  Post Acute Care Choice:  NA Choice offered to:  Patient  DME Arranged:  (has RW and 3in1) DME Agency:  NA  HH Arranged:  NA HH [facility]  Status of Service:  Completed, signed off  If discussed at [employer] Meetings, dates discussed:    Additional Comments:  Durenda GuthrieBrady, Devon Kingdon Naomi, RN 05/27/2017, 1:53 PM

## 2017-05-27 NOTE — Progress Notes (Signed)
Patient ID: Dana EpleyRegina Dehn, female   DOB: Sep 19, 1945, 72 y.o.   MRN: [id number] Doing well overall.  Can be discharged to skilled nursing today.  Right hip stable.  Vitals stable.

## 2017-05-27 NOTE — Progress Notes (Signed)
Gave report via phone to Aldona LentoKeana Acebo at [facility].

## 2017-05-27 NOTE — Clinical Social Work Placement (Signed)
   CLINICAL SOCIAL WORK PLACEMENT  NOTE  Date:  05/27/2017  Patient Details  Name: Dana EpleyRegina Dobrowski MRN: [id number] Date of Birth: 02-Jun-1945  Clinical Social Work is seeking post-discharge placement for this patient at the Skilled  Nursing Facility level of care (*CSW will initial, date and re-position this form in  chart as items are completed):      Patient/family provided with [facility] Clinical Social Work Department's list of facilities offering this level of care within the geographic area requested by the patient (or if unable, by the patient's family).  Yes   Patient/family informed of their freedom to choose among providers that offer the needed level of care, that participate in Medicare, Medicaid or managed care program needed by the patient, have an available bed and are willing to accept the patient.      Patient/family informed of [facility]'s ownership interest in [facility] and [facility], as well as of the fact that they are under no obligation to receive care at these facilities.  PASRR submitted to EDS on       PASRR number received on       Existing PASRR number confirmed on 05/27/17     FL2 transmitted to all facilities in geographic area requested by pt/family on 05/27/17     FL2 transmitted to all facilities within larger geographic area on       Patient informed that his/her managed care company has contracts with or will negotiate with certain facilities, including the following:        Yes   Patient/family informed of bed offers received.  Patient chooses bed at [facility]     Physician recommends and patient chooses bed at      [facility] on 05/27/17.  Patient to be transferred to facility by [facility]     Patient family notified on 05/27/17 of transfer.  Name of family member notified:  Pedro EarlsKim Sires, sister     PHYSICIAN       Additional Comment:     _______________________________________________ Maree KrabbeBridget A Jacqeline Broers, LCSW 05/27/2017, 2:01 PM

## 2017-05-27 NOTE — Progress Notes (Signed)
Chaplain is grateful for the Nurse from this unit, whose name I cannot recall, who stopped me in front of the cafeteria and asked about devotional materials available for patients. Chaplain Betzaira Mentel went to Ms. Dales's room and offered greetings and presented her with printed Devotional / Meditation materials. Dana Nelson was grateful, and also expressed good feelings about the next phase of her care away from [facility]. Before departing Chaplain offered Scripture, Prayer, reflection and words of comfort. Dana Nelson expressed gratitude.

## 2017-05-27 NOTE — Care Management Important Message (Signed)
Important Message  Patient Details  Name: Dana EpleyRegina Nelson MRN: [id number] Date of Birth: [birth year]   Medicare Important Message Given:  Yes    Dana Nelson 05/27/2017, 3:03 PM

## 2017-05-28 LAB — BASIC METABOLIC PANEL
BUN: 12 (ref 4–21)
CREATININE: 0.4 — AB (ref 0.5–1.1)
GLUCOSE: 100
POTASSIUM: 4 (ref 3.4–5.3)
Sodium: 141 (ref 137–147)

## 2017-05-28 LAB — CBC AND DIFFERENTIAL
HCT: 28 — AB (ref 36–46)
HEMOGLOBIN: 8.8 — AB (ref 12.0–16.0)
Neutrophils Absolute: 6
PLATELETS: 342 (ref 150–399)
WBC: 9.6

## 2017-05-30 ENCOUNTER — Encounter: Payer: Self-pay | Admitting: Internal Medicine

## 2017-05-30 ENCOUNTER — Non-Acute Institutional Stay (SKILLED_NURSING_FACILITY): Payer: Medicare Other | Admitting: Internal Medicine

## 2017-05-30 DIAGNOSIS — M1611 Unilateral primary osteoarthritis, right hip: Secondary | ICD-10-CM | POA: Diagnosis not present

## 2017-05-30 DIAGNOSIS — I10 Essential (primary) hypertension: Secondary | ICD-10-CM

## 2017-05-30 DIAGNOSIS — E119 Type 2 diabetes mellitus without complications: Secondary | ICD-10-CM | POA: Diagnosis not present

## 2017-05-30 DIAGNOSIS — E785 Hyperlipidemia, unspecified: Secondary | ICD-10-CM

## 2017-05-30 DIAGNOSIS — D62 Acute posthemorrhagic anemia: Secondary | ICD-10-CM

## 2017-05-30 DIAGNOSIS — E034 Atrophy of thyroid (acquired): Secondary | ICD-10-CM | POA: Diagnosis not present

## 2017-05-30 NOTE — Progress Notes (Signed)
: Provider:  Randon Goldsmith. Lyn Hollingshead, MD Location:  [facility] Room Number: [id number] Place of Service:  SNF [phone])  PCP: Macy Mis, MD Patient Care Team: Macy Mis, MD as PCP - General (Family Medicine)  Extended Emergency Contact Information Primary Emergency Contact: Milta Deiters [place] Home Phone: [phone] Relation: Sister     Allergies: Latex; Eggs or egg-derived products; Losartan; Other; Peanut-containing drug products; and Penicillins  Chief Complaint  Patient presents with  . New Admit To SNF    Admit to Facility    HPI: [name] with hypertension, hypothyroidism, anemia, history of AVMs with bleed, and osteoarthritis right hip who was admitted to [facility] from 4/16[facility]19 for a planned total right hip arthroplasty.  Postop hemoglobin 7.7, patient most of received a blood transfusion because on 4/18 patient's hemoglobin was 9.6.  Apparently there were no further complications.  Patient is admitted to skilled nursing facility for OT/PT.  While at skilled nursing facility patient will be followed for hypertension treated with Norvasc, hydralazine and metoprolol, gout treated with allopurinol and diabetes treated with metformin.  Past Medical History:  Diagnosis Date  . Acute blood loss anemia   . Angioedema 2018  . Asthma    not current  . AVM (arteriovenous malformation) of small bowel, acquired    Jejunum - capsule endo 09/2016  . DVT (deep venous thrombosis) (HCC) 06/2016  . Dyspnea    with exertion  . Eczema   . Eczema   . GERD (gastroesophageal reflux disease) 08/17/2016  . GI bleed 12/04/2016  . Gout   . History of blood transfusion 11/2016   GI bleed  . Hyperlipemia   . Hypertension   . Hypothyroidism   . Iron deficiency anemia   . Lip swelling 08/2016  . Melena 12/04/2016  . Open-angle glaucoma   . Osteoarthritis   . Pre-diabetes    05/13/17- "Dr said she will probably stop it"    . Right hip pain 12/07/2016  . Right knee pain 12/07/2016  . Vitamin D deficiency     Past Surgical History:  Procedure Laterality Date  . ABDOMINAL HYSTERECTOMY    . COLONOSCOPY  2018   [place] - one polyp  . ENTEROSCOPY N/A 12/06/2016   Procedure: ENTEROSCOPY;  Surgeon: Sherrilyn Rist, MD;  Location: [facility] ENDOSCOPY;  Service: Gastroenterology;  Laterality: N/A;  . ESOPHAGOGASTRODUODENOSCOPY  2018   [place] - gastritis  . GIVENS CAPSULE STUDY  2018   [place] - jejunal AVM's, gastritis 2h 6 min small bowel passage, AVM first at 1 hr post duodenum  . SMALL BOWEL ENTEROSCOPY  03/21/2017  . THYROIDECTOMY    . TOTAL HIP ARTHROPLASTY Right 05/24/2017   Procedure: RIGHT TOTAL HIP ARTHROPLASTY ANTERIOR APPROACH;  Surgeon: Kathryne Hitch, MD;  Location: [facility] OR;  Service: Orthopedics;  Laterality: Right;    Allergies as of 05/30/2017      Reactions   Latex Swelling   Eggs Or Egg-derived Products Other (See Comments)   Reported by [facility] 11/24/11 - unknown reaction   Losartan Swelling   Swelling of lips and face   Other Other (See Comments)   Hives and facial swelling from all nuts (tree nuts and peanuts)   Peanut-containing Drug Products Hives, Swelling   Facial swelling   Penicillins Other (See Comments)   Childhood allergy- convulstions Has patient had a PCN reaction causing immediate rash, facial/tongue/throat swelling, SOB or lightheadedness with  hypotension: Yes Has patient had a PCN reaction causing severe rash involving mucus membranes or skin necrosis: No Has patient had a PCN reaction that required hospitalization: Yes Has patient had a PCN reaction occurring within the last 10 years: No If all of the above answers are "NO", then may proceed with Cephalosporin use.      Medication List        Accurate as of 05/30/17  9:54 AM. Always use your most recent med list.          allopurinol 100 MG tablet Commonly known as:  ZYLOPRIM Take 100 mg daily by mouth.    amLODipine 5 MG tablet Commonly known as:  NORVASC Take 5 mg by mouth daily.   aspirin 81 MG chewable tablet Commonly known as:  ASPIRIN CHILDRENS Chew 1 tablet (81 mg total) by mouth 2 (two) times daily after a meal.   famotidine 40 MG tablet Commonly known as:  PEPCID Take 1 tablet (40 mg total) by mouth daily.   ferrous sulfate 325 (65 FE) MG tablet Take 1 tablet (325 mg total) by mouth daily.   hydrALAZINE 25 MG tablet Commonly known as:  APRESOLINE Take 25 mg by mouth 2 (two) times daily. At morning and lunch   levothyroxine 112 MCG tablet Commonly known as:  SYNTHROID, LEVOTHROID Take 112 mcg by mouth daily before lunch.   metFORMIN 750 MG 24 hr tablet Commonly known as:  GLUCOPHAGE-XR Take 750 mg by mouth 2 (two) times daily.   methocarbamol 500 MG tablet Commonly known as:  ROBAXIN Take 1 tablet (500 mg total) by mouth every 6 (six) hours as needed for muscle spasms.   metoprolol succinate 50 MG 24 hr tablet Commonly known as:  TOPROL-XL Take 50 mg by mouth daily. Take with or immediately following a meal.   oxyCODONE 5 MG immediate release tablet Commonly known as:  Oxy IR/ROXICODONE Take 1-2 tablets (5-10 mg total) by mouth every 4 (four) hours as needed for moderate pain (pain score 4-6).   pravastatin 20 MG tablet Commonly known as:  PRAVACHOL Take 20 mg by mouth at bedtime.   VENTOLIN HFA 108 (90 Base) MCG/ACT inhaler Generic drug:  albuterol Inhale 2 puffs into the lungs every 6 (six) hours as needed for wheezing.       No orders of the defined types were placed in this encounter.   Immunization History  Administered Date(s) Administered  . Pneumococcal Polysaccharide-23 04/05/2012  . Tdap 04/23/2011    Social History   Tobacco Use  . Smoking status: Never Smoker  . Smokeless tobacco: Never Used  Substance Use Topics  . Alcohol use: No    Family history is   Family History  Problem Relation Age of Onset  . Dementia Mother         died at 66  . Hypertension Mother   . Alzheimer's disease Mother   . Heart attack Father        died 29  . Hypertension Father   . Ovarian cancer Sister   . Heart attack Brother   . Asthma Son   . Heart attack Sister       Review of Systems  DATA OBTAINED: from patient,family GENERAL:  no fevers, fatigue, appetite changes SKIN: No itching, or rash EYES: No eye pain, redness, discharge EARS: No earache, tinnitus, change in hearing NOSE: No congestion, drainage or bleeding  MOUTH/THROAT: No mouth or tooth pain, No sore throat RESPIRATORY: No cough, wheezing, SOB CARDIAC: No  chest pain, palpitations, lower extremity edema  GI: No abdominal pain, No N/V/D or constipation, No heartburn or reflux  GU: No dysuria, frequency or urgency, or incontinence  MUSCULOSKELETAL: No unrelieved bone/joint pain NEUROLOGIC: No headache, dizziness or focal weakness PSYCHIATRIC: No c/o anxiety or sadness   Vitals:   05/30/17 0944  BP: 137/78  Pulse: 84  Resp: 20  Temp: 98.2 F (36.8 C)  SpO2: 96%    SpO2 Readings from Last 1 Encounters:  05/30/17 96%   Body mass index is 29.06 kg/m.     Physical Exam  GENERAL APPEARANCE: Alert, conversant,  No acute distress.  SKIN: No diaphoresis rash HEAD: Normocephalic, atraumatic  EYES: Conjunctiva/lids clear. Pupils round, reactive. EOMs intact.  EARS: External exam WNL, canals clear. Hearing grossly normal.  NOSE: No deformity or discharge.  MOUTH/THROAT: Lips w/o lesions  RESPIRATORY: Breathing is even, unlabored. Lung sounds are clear   CARDIOVASCULAR: Heart RRR no murmurs, rubs or gallops. No peripheral edema.   GASTROINTESTINAL: Abdomen is soft, non-tender, not distended w/ normal bowel sounds. GENITOURINARY: Bladder non tender, not distended  MUSCULOSKELETAL: No abnormal joints or musculature NEUROLOGIC:  Cranial nerves 2-12 grossly intact. Moves all extremities  PSYCHIATRIC: Mood and affect appropriate to situation, no behavioral  issues  Patient Active Problem List   Diagnosis Date Noted  . Status post total replacement of right hip 05/24/2017  . Unilateral primary osteoarthritis, right hip 04/19/2017  . Unilateral primary osteoarthritis, left hip 04/19/2017  . Bilateral primary osteoarthritis of hip 04/08/2017  . Chronic anticoagulation 12/27/2016  . Hypomagnesemia 12/27/2016  . Diabetes mellitus without complication (HCC) 12/18/2016  . Osteoarthritis 12/18/2016  . Right knee pain 12/07/2016  . Right hip pain 12/07/2016  . AVM (arteriovenous malformation) of small bowel, acquired   . Hypokalemia 12/04/2016  . Melena 12/04/2016  . GI bleed 12/04/2016  . Hematochezia   . Acute blood loss anemia   . Chronic iron deficiency anemia   . Angioedema 08/17/2016  . Blood loss anemia 08/17/2016  . DVT (deep venous thrombosis) (HCC) 08/17/2016  . HTN (hypertension) 08/17/2016  . Hyperlipidemia 08/17/2016  . Microcytic anemia 08/17/2016  . Hypothyroidism 08/17/2016  . GERD (gastroesophageal reflux disease) 08/17/2016      Labs reviewed: Basic Metabolic Panel:    Component Value Date/Time   NA 136 05/25/2017 0522   NA 139 12/13/2016   K 4.5 05/25/2017 0522   CL 104 05/25/2017 0522   CO2 22 05/25/2017 0522   GLUCOSE 114 (H) 05/25/2017 0522   BUN 12 05/25/2017 0522   BUN 13 12/13/2016   CREATININE 0.59 05/25/2017 0522   CALCIUM 9.4 05/25/2017 0522   PROT 8.0 02/18/2017 1216   ALBUMIN 3.9 02/18/2017 1216   AST 10 02/18/2017 1216   ALT 10 02/18/2017 1216   ALKPHOS 78 02/18/2017 1216   BILITOT 0.4 02/18/2017 1216   GFRNONAA >60 05/25/2017 0522   GFRAA >60 05/25/2017 0522    Recent Labs    12/06/16 0908 12/07/16 0510 12/08/16 0541  02/18/17 1216 05/13/17 0905 05/25/17 0522  NA 140 139 137   < > 141 137 136  K 3.8 3.6 4.1   < > 3.4* 3.8 4.5  CL 104 106 103   < > 102 104 104  CO2 27 25 25    < > 28 23 22   GLUCOSE 97 106* 102*   < > 91 97 114*  BUN 7 7 9    < > 8 8 12   CREATININE 0.73 0.72 0.75    < >  0.45 0.46 0.59  CALCIUM 9.1 8.7* 8.9   < > 9.8 9.7 9.4  MG 1.6* 1.5* 1.8  --   --   --   --   PHOS 3.3 3.5  --   --   --   --   --    < > = values in this interval not displayed.   Liver Function Tests: Recent Labs    12/08/16 0541 12/09/16 0654 02/18/17 1216  AST 15 16 10   ALT 11* 11* 10  ALKPHOS 70 73 78  BILITOT 0.6 1.2 0.4  PROT 6.0* 6.5 8.0  ALBUMIN 2.7* 2.7* 3.9   Recent Labs    12/04/16 1251  LIPASE 16   No results for input(s): AMMONIA in the last 8760 hours. CBC: Recent Labs    12/08/16 1007  12/10/16 0400  02/18/17 1122 05/13/17 0905 05/25/17 0522 05/26/17 0627  WBC 11.2*   < > 10.4   < > 7.6 7.0 9.4 11.7*  NEUTROABS 8.2*  --  7.3  --  5.1  --   --   --   HGB 10.9*   < > 9.0*   < > 10.1* 9.1* 7.7* 9.6*  HCT 33.5*   < > 28.8*   < > 33.3* 30.6* 25.9* 29.6*  MCV 80.7   < > 83.2  --  71.3* 69.7* 70.6* 74.2*  PLT PLATELET CLUMPS NOTED ON SMEAR, COUNT APPEARS ADEQUATE   < > 400   < > 527.0* 430* 446* 340   < > = values in this interval not displayed.   Lipid No results for input(s): CHOL, HDL, LDLCALC, TRIG in the last 8760 hours.  Cardiac Enzymes: No results for input(s): CKTOTAL, CKMB, CKMBINDEX, TROPONINI in the last 8760 hours. BNP: No results for input(s): BNP in the last 8760 hours. No results found for: [facility] Lab Results  Component Value Date   HGBA1C 5.3 05/13/2017   Lab Results  Component Value Date   TSH 1.46 02/18/2017   No results found for: VITAMINB12 No results found for: FOLATE Lab Results  Component Value Date   IRON 17 (L) 02/18/2017   TIBC 406 08/17/2016   FERRITIN 26.8 02/18/2017    Imaging and Procedures obtained prior to SNF admission: No results found.   Not all labs, radiology exams or other studies done during hospitalization come through on my EPIC note; however they are reviewed by me.    Assessment and Plan  Right hip osteoarthritis/status post total right hip arthroplasty- planned; patient is being  prophylaxed with aspirin 81 mg twice daily SNF -admitted for OT/PT; will continue aspirin 81 mg twice daily until orthopedics has stopped has had no stop date listed  Acute blood loss anemia, postop SNF -documented discharge summary with patient's postop hemoglobin 7.7 and hemoglobin at a later is 9.6 so I assume patient had a transfusion; will follow-up CBC  Hypertension SNF -trolled; continue Norvasc 5 mg daily, hydralazine 25 mg twice daily and Toprol-XL 50 mg daily  Diabetes mellitus type 2 SNF -no status uncontrolled; continue metformin XR 750 mg twice daily: Patient is on statin, not on ARB/ACE  Hypothyroidism SNF -status uncontrolled; continue Synthroid 112 mcg daily  Hyperlipidemia SNF -status uncontrolled; continue Pravachol 20 mg   Time spent greater than 35 minutes;> 50% of time with patient was spent reviewing records, labs, tests and studies, counseling and developing plan of care  Thurston Hole D. Lyn Hollingshead, MD

## 2017-06-05 ENCOUNTER — Encounter: Payer: Self-pay | Admitting: Internal Medicine

## 2017-06-07 ENCOUNTER — Ambulatory Visit (INDEPENDENT_AMBULATORY_CARE_PROVIDER_SITE_OTHER): Payer: Medicare Other | Admitting: Orthopaedic Surgery

## 2017-06-07 ENCOUNTER — Encounter (INDEPENDENT_AMBULATORY_CARE_PROVIDER_SITE_OTHER): Payer: Self-pay | Admitting: Orthopaedic Surgery

## 2017-06-07 DIAGNOSIS — M1712 Unilateral primary osteoarthritis, left knee: Secondary | ICD-10-CM | POA: Diagnosis not present

## 2017-06-07 DIAGNOSIS — Z96641 Presence of right artificial hip joint: Secondary | ICD-10-CM

## 2017-06-07 MED ORDER — METHYLPREDNISOLONE ACETATE 40 MG/ML IJ SUSP
40.0000 mg | INTRAMUSCULAR | Status: AC | PRN
  Administered 2017-06-07: 40 mg via INTRA_ARTICULAR

## 2017-06-07 MED ORDER — LIDOCAINE HCL 1 % IJ SOLN
3.0000 mL | INTRAMUSCULAR | Status: AC | PRN
  Administered 2017-06-07: 3 mL

## 2017-06-07 NOTE — Progress Notes (Signed)
Office Visit Note   Patient: Dana Nelson           Date of Birth: [birth year]           MRN: [id number] Visit Date: 06/07/2017              Requested by: Macy Mis, MD [address] [place], [place] [address] PCP: Macy Mis, MD   Assessment & Plan: Visit Diagnoses:  1. Status post total replacement of right hip   2. Unilateral primary osteoarthritis, left knee     Plan: Given her known left knee pain and arthritis in the left knee she requested a steroid injection in the and I feel fine doing that today.  She understands risk minutes injections.  As far as her right hip does we will see her back in 4 weeks to see how she is doing overall.  She has known severe disease in her left hip and we can work on scheduling her left hip replacement sometime after that.  She will go back down to her 81 mg aspirin just once daily.  Follow-Up Instructions: Return in about 1 month (around 07/05/2017).   Orders:  No orders of the defined types were placed in this encounter.  No orders of the defined types were placed in this encounter.     Procedures: Large Joint Inj: L knee on 06/07/2017 9:41 AM Indications: diagnostic evaluation and pain Details: 22 G 1.5 in needle, superolateral approach  Arthrogram: No  Medications: 3 mL lidocaine 1 %; 40 mg methylPREDNISolone acetate 40 MG/ML Outcome: tolerated well, no immediate complications Procedure, treatment alternatives, risks and benefits explained, specific risks discussed. Consent was given by the patient. Immediately prior to procedure a time out was called to verify the correct patient, procedure, equipment, support staff and site/side marked as required. Patient was prepped and draped in the usual sterile fashion.       Clinical Data: No additional findings.   Subjective: Chief Complaint  Patient presents with  . Right Hip - Routine Post Op  The patient is following up in 2 weeks today status post a  right total hip arthroplasty through direct anterior approach.  She states the nursing facility for rehab.  She would like to have a steroid injection in her left knee due to known pain and arthritis in the left knee.  She is right hip is doing well.  She has someone from the facility with her and also tested she is doing well.  She is been on 81 mg twice daily as well.  She says she is not needing a lot of pain medication.  HPI  Review of Systems   Objective: Vital Signs: There were no vitals taken for this visit.  Physical Exam She is alert and oriented no acute distress Ortho Exam Examination of her right hip shows the incision looks good.  I removed staples and placed Steri-Strips.  There was a mild seroma and I drained about 20 cc of fluid from her incision but there is no evidence of infection.  There is no wound breakdown either. Specialty Comments:  No specialty comments available.  Imaging: No results found.   PMFS History: Patient Active Problem List   Diagnosis Date Noted  . Status post total replacement of right hip 05/24/2017  . Unilateral primary osteoarthritis, right hip 04/19/2017  . Unilateral primary osteoarthritis, left hip 04/19/2017  . Bilateral primary osteoarthritis of hip 04/08/2017  . Chronic anticoagulation 12/27/2016  .  Hypomagnesemia 12/27/2016  . Diabetes mellitus without complication (HCC) 12/18/2016  . Osteoarthritis 12/18/2016  . Right knee pain 12/07/2016  . Right hip pain 12/07/2016  . AVM (arteriovenous malformation) of small bowel, acquired   . Hypokalemia 12/04/2016  . Melena 12/04/2016  . GI bleed 12/04/2016  . Hematochezia   . Acute blood loss as cause of postoperative anemia   . Chronic iron deficiency anemia   . Angioedema 08/17/2016  . Blood loss anemia 08/17/2016  . DVT (deep venous thrombosis) (HCC) 08/17/2016  . HTN (hypertension) 08/17/2016  . Hyperlipidemia 08/17/2016  . Microcytic anemia 08/17/2016  . Hypothyroidism  08/17/2016  . GERD (gastroesophageal reflux disease) 08/17/2016   Past Medical History:  Diagnosis Date  . Acute blood loss anemia   . Angioedema 2018  . Asthma    not current  . AVM (arteriovenous malformation) of small bowel, acquired    Jejunum - capsule endo 09/2016  . DVT (deep venous thrombosis) (HCC) 06/2016  . Dyspnea    with exertion  . Eczema   . Eczema   . GERD (gastroesophageal reflux disease) 08/17/2016  . GI bleed 12/04/2016  . Gout   . History of blood transfusion 11/2016   GI bleed  . Hyperlipemia   . Hypertension   . Hypothyroidism   . Iron deficiency anemia   . Lip swelling 08/2016  . Melena 12/04/2016  . Open-angle glaucoma   . Osteoarthritis   . Pre-diabetes    05/13/17- "Dr said she will probably stop it"  . Right hip pain 12/07/2016  . Right knee pain 12/07/2016  . Vitamin D deficiency     Family History  Problem Relation Age of Onset  . Dementia Mother        died at 14  . Hypertension Mother   . Alzheimer's disease Mother   . Heart attack Father        died 32  . Hypertension Father   . Ovarian cancer Sister   . Heart attack Brother   . Asthma Son   . Heart attack Sister     Past Surgical History:  Procedure Laterality Date  . ABDOMINAL HYSTERECTOMY    . COLONOSCOPY  2018   GA - one polyp  . ENTEROSCOPY N/A 12/06/2016   Procedure: ENTEROSCOPY;  Surgeon: Sherrilyn Rist, MD;  Location: [facility] ENDOSCOPY;  Service: Gastroenterology;  Laterality: N/A;  . ESOPHAGOGASTRODUODENOSCOPY  2018   GA - gastritis  . GIVENS CAPSULE STUDY  2018   GA - jejunal AVM's, gastritis 2h 6 min small bowel passage, AVM first at 1 hr post duodenum  . SMALL BOWEL ENTEROSCOPY  03/21/2017  . THYROIDECTOMY    . TOTAL HIP ARTHROPLASTY Right 05/24/2017   Procedure: RIGHT TOTAL HIP ARTHROPLASTY ANTERIOR APPROACH;  Surgeon: Kathryne Hitch, MD;  Location: [facility] OR;  Service: Orthopedics;  Laterality: Right;   Social History   Occupational History  . Occupation:  retired Child psychotherapist  Tobacco Use  . Smoking status: Never Smoker  . Smokeless tobacco: Never Used  Substance and Sexual Activity  . Alcohol use: No  . Drug use: No  . Sexual activity: Not on file

## 2017-06-20 ENCOUNTER — Non-Acute Institutional Stay (SKILLED_NURSING_FACILITY): Payer: Medicare Other | Admitting: Internal Medicine

## 2017-06-20 ENCOUNTER — Encounter: Payer: Self-pay | Admitting: Internal Medicine

## 2017-06-20 DIAGNOSIS — I10 Essential (primary) hypertension: Secondary | ICD-10-CM

## 2017-06-20 DIAGNOSIS — K219 Gastro-esophageal reflux disease without esophagitis: Secondary | ICD-10-CM

## 2017-06-20 DIAGNOSIS — E034 Atrophy of thyroid (acquired): Secondary | ICD-10-CM | POA: Diagnosis not present

## 2017-06-20 NOTE — Progress Notes (Signed)
Location:  [facility] Room Number: [phone] Place of Service:  SNF [phone])  Dana Nelson. Dana Hollingshead, MD  Patient Care Team: Dana Mis, MD as PCP - General [facility])  Extended Emergency Contact Information Primary Emergency Contact: Dana Nelson [place] of [place] Home Phone: [phone] Relation: Sister    Allergies: Latex; Eggs or egg-derived products; Losartan; Other; Peanut-containing drug products; and Penicillins  Chief Complaint  Patient presents with  . Medical Management of Chronic Issues    RoutineVisit    HPI: [name] who is being seen for routine issues of hypothyroidism, hypertension, and GERD.  Past Medical History:  Diagnosis Date  . Acute blood loss anemia   . Angioedema 2018  . Asthma    not current  . AVM (arteriovenous malformation) of small bowel, acquired    Jejunum - capsule endo 09/2016  . DVT (deep venous thrombosis) (HCC) 06/2016  . Dyspnea    with exertion  . Eczema   . Eczema   . GERD (gastroesophageal reflux disease) 08/17/2016  . GI bleed 12/04/2016  . Gout   . History of blood transfusion 11/2016   GI bleed  . Hyperlipemia   . Hypertension   . Hypothyroidism   . Iron deficiency anemia   . Lip swelling 08/2016  . Melena 12/04/2016  . Open-angle glaucoma   . Osteoarthritis   . Pre-diabetes    05/13/17- "Dr said she will probably stop it"  . Right hip pain 12/07/2016  . Right knee pain 12/07/2016  . Vitamin D deficiency     Past Surgical History:  Procedure Laterality Date  . ABDOMINAL HYSTERECTOMY    . COLONOSCOPY  2018   GA - one polyp  . ENTEROSCOPY N/A 12/06/2016   Procedure: ENTEROSCOPY;  Surgeon: Sherrilyn Rist, MD;  Location: [facility] ENDOSCOPY;  Service: Gastroenterology;  Laterality: N/A;  . ESOPHAGOGASTRODUODENOSCOPY  2018   GA - gastritis  . GIVENS CAPSULE STUDY  2018   GA - jejunal AVM's, gastritis 2h 6 min small bowel passage, AVM first at 1 hr post duodenum    . SMALL BOWEL ENTEROSCOPY  03/21/2017  . THYROIDECTOMY    . TOTAL HIP ARTHROPLASTY Right 05/24/2017   Procedure: RIGHT TOTAL HIP ARTHROPLASTY ANTERIOR APPROACH;  Surgeon: Kathryne Hitch, MD;  Location: [facility] OR;  Service: Orthopedics;  Laterality: Right;    Allergies as of 06/20/2017      Reactions   Latex Swelling   Eggs Or Egg-derived Products Other (See Comments)   Reported by [facility] 11/24/11 - unknown reaction   Losartan Swelling   Swelling of lips and face   Other Other (See Comments)   Hives and facial swelling from all nuts (tree nuts and peanuts)   Peanut-containing Drug Products Hives, Swelling   Facial swelling   Penicillins Other (See Comments)   Childhood allergy- convulstions Has patient had a PCN reaction causing immediate rash, facial/tongue/throat swelling, SOB or lightheadedness with hypotension: Yes Has patient had a PCN reaction causing severe rash involving mucus membranes or skin necrosis: No Has patient had a PCN reaction that required hospitalization: Yes Has patient had a PCN reaction occurring within the last 10 years: No If all of the above answers are "NO", then may proceed with Cephalosporin use.      Medication List        Accurate as of 06/20/17 11:59 PM. Always use your most recent med list.  allopurinol 100 MG tablet Commonly known as:  ZYLOPRIM Take 100 mg daily by mouth.   amLODipine 5 MG tablet Commonly known as:  NORVASC Take 5 mg by mouth daily.   aspirin 81 MG chewable tablet Commonly known as:  ASPIRIN CHILDRENS Chew 1 tablet (81 mg total) by mouth 2 (two) times daily after a meal.   famotidine 40 MG tablet Commonly known as:  PEPCID Take 1 tablet (40 mg total) by mouth daily.   ferrous sulfate 325 (65 FE) MG tablet Take 1 tablet (325 mg total) by mouth daily.   levothyroxine 112 MCG tablet Commonly known as:  SYNTHROID, LEVOTHROID Take 112 mcg by mouth daily before lunch.   metFORMIN 750 MG 24 hr  tablet Commonly known as:  GLUCOPHAGE-XR Take 750 mg by mouth 2 (two) times daily.   methocarbamol 500 MG tablet Commonly known as:  ROBAXIN Take 1 tablet (500 mg total) by mouth every 6 (six) hours as needed for muscle spasms.   metoprolol succinate 50 MG 24 hr tablet Commonly known as:  TOPROL-XL Take 50 mg by mouth daily. Take with or immediately following a meal.   pravastatin 20 MG tablet Commonly known as:  PRAVACHOL Take 20 mg by mouth at bedtime.   VENTOLIN HFA 108 (90 Base) MCG/ACT inhaler Generic drug:  albuterol Inhale 2 puffs into the lungs every 6 (six) hours as needed for wheezing.       No orders of the defined types were placed in this encounter.   Immunization History  Administered Date(s) Administered  . Pneumococcal Polysaccharide-23 04/05/2012  . Tdap 04/23/2011    Social History   Tobacco Use  . Smoking status: Never Smoker  . Smokeless tobacco: Never Used  Substance Use Topics  . Alcohol use: No    Review of Systems  DATA OBTAINED: from patient, nurse GENERAL:  no fevers, fatigue, appetite changes SKIN: No itching, rash HEENT: No complaint RESPIRATORY: No cough, wheezing, SOB CARDIAC: No chest pain, palpitations, lower extremity edema  GI: No abdominal pain, No N/V/D or constipation, No heartburn or reflux  GU: No dysuria, frequency or urgency, or incontinence  MUSCULOSKELETAL: No unrelieved bone/joint pain NEUROLOGIC: No headache, dizziness  PSYCHIATRIC: No overt anxiety or sadness  Vitals:   06/20/17 1436  BP: (!) 127/56  Pulse: 72  Resp: 20  Temp: 97.6 F (36.4 C)  SpO2: 95%   Body mass index is 30.47 kg/m. Physical Exam  GENERAL APPEARANCE: Alert, conversant, No acute distress  SKIN: No diaphoresis rash HEENT: Unremarkable RESPIRATORY: Breathing is even, unlabored. Lung sounds are clear   CARDIOVASCULAR: Heart RRR no murmurs, rubs or gallops. No peripheral edema  GASTROINTESTINAL: Abdomen is soft, non-tender, not  distended w/ normal bowel sounds.  GENITOURINARY: Bladder non tender, not distended  MUSCULOSKELETAL: No abnormal joints or musculature NEUROLOGIC: Cranial nerves 2-12 grossly intact. Moves all extremities PSYCHIATRIC: Mood and affect appropriate to situation, no behavioral issues  Patient Active Problem List   Diagnosis Date Noted  . Status post total replacement of right hip 05/24/2017  . Unilateral primary osteoarthritis, right hip 04/19/2017  . Unilateral primary osteoarthritis, left hip 04/19/2017  . Bilateral primary osteoarthritis of hip 04/08/2017  . Chronic anticoagulation 12/27/2016  . Hypomagnesemia 12/27/2016  . Diabetes mellitus without complication (HCC) 12/18/2016  . Osteoarthritis 12/18/2016  . Right knee pain 12/07/2016  . Right hip pain 12/07/2016  . AVM (arteriovenous malformation) of small bowel, acquired   . Hypokalemia 12/04/2016  . Melena 12/04/2016  . GI bleed  12/04/2016  . Hematochezia   . Acute blood loss as cause of postoperative anemia   . Chronic iron deficiency anemia   . Angioedema 08/17/2016  . Blood loss anemia 08/17/2016  . DVT (deep venous thrombosis) (HCC) 08/17/2016  . HTN (hypertension) 08/17/2016  . Hyperlipidemia 08/17/2016  . Microcytic anemia 08/17/2016  . Hypothyroidism 08/17/2016  . GERD (gastroesophageal reflux disease) 08/17/2016    CMP     Component Value Date/Time   NA 141 05/28/2017   K 4.0 05/28/2017   CL 104 05/25/2017 0522   CO2 22 05/25/2017 0522   GLUCOSE 114 (H) 05/25/2017 0522   BUN 12 05/28/2017   CREATININE 0.4 (A) 05/28/2017   CREATININE 0.59 05/25/2017 0522   CALCIUM 9.4 05/25/2017 0522   PROT 8.0 02/18/2017 1216   ALBUMIN 3.9 02/18/2017 1216   AST 10 02/18/2017 1216   ALT 10 02/18/2017 1216   ALKPHOS 78 02/18/2017 1216   BILITOT 0.4 02/18/2017 1216   GFRNONAA >60 05/25/2017 0522   GFRAA >60 05/25/2017 0522   Recent Labs    12/06/16 0908 12/07/16 0510 12/08/16 0541  02/18/17 1216 05/13/17 0905  05/25/17 0522 05/28/17  NA 140 139 137   < > 141 137 136 141  K 3.8 3.6 4.1   < > 3.4* 3.8 4.5 4.0  CL 104 106 103   < > 102 104 104  --   CO2 < > --   GLUCOSE 97 106* 102*   < > 91 97 114*  --   BUN < > CREATININE 0.73 0.72 0.75   < > 0.45 0.46 0.59 0.4*  CALCIUM 9.1 8.7* 8.9   < > 9.8 9.7 9.4  --   MG 1.6* 1.5* 1.8  --   --   --   --   --   PHOS 3.3 3.5  --   --   --   --   --   --    < > = values in this interval not displayed.   Recent Labs    12/08/16 0541 12/09/16 0654 02/18/17 1216  AST ALT 11* 11* 10  ALKPHOS 70 73 78  BILITOT 0.6 1.2 0.4  PROT 6.0* 6.5 8.0  ALBUMIN 2.7* 2.7* 3.9   Recent Labs    12/10/16 0400  02/18/17 1122 05/13/17 0905 05/25/17 0522 05/26/17 0627 05/28/17  WBC 10.4   < > 7.6 7.0 9.4 11.7* 9.6  NEUTROABS 7.3  --  5.1  --   --   --  6  HGB 9.0*   < > 10.1* 9.1* 7.7* 9.6* 8.8*  HCT 28.8*   < > 33.3* 30.6* 25.9* 29.6* 28*  MCV 83.2  --  71.3* 69.7* 70.6* 74.2*  --   PLT 400   < > 527.0* 430* 446* 340 342   < > = values in this interval not displayed.   No results for input(s): CHOL, LDLCALC, TRIG in the last 8760 hours.  Invalid input(s): HCL No results found for: MICROALBUR Lab Results  Component Value Date   TSH 1.46 02/18/2017   Lab Results  Component Value Date   HGBA1C 5.3 05/13/2017   No results found for: CHOL, HDL, LDLCALC, LDLDIRECT, TRIG, CHOLHDL  Significant Diagnostic Results in last 30 days:  No results found.  Assessment and Plan  Hypothyroidism TSH 1.46; continue Synthroid 112 mcg daily  HTN (hypertension) Controlled; continue with Norvasc 5 mg p.o. daily and Toprol-XL 50 mg daily  GERD (gastroesophageal reflux disease) No complaint of problems or reflux; continue Pepcid 40 mg daily     Dana Nelson D. Dana Hollingshead, MD

## 2017-06-27 ENCOUNTER — Encounter: Payer: Self-pay | Admitting: Internal Medicine

## 2017-06-27 ENCOUNTER — Non-Acute Institutional Stay (SKILLED_NURSING_FACILITY): Payer: Medicare Other | Admitting: Internal Medicine

## 2017-06-27 DIAGNOSIS — E785 Hyperlipidemia, unspecified: Secondary | ICD-10-CM | POA: Diagnosis not present

## 2017-06-27 DIAGNOSIS — E034 Atrophy of thyroid (acquired): Secondary | ICD-10-CM

## 2017-06-27 DIAGNOSIS — E119 Type 2 diabetes mellitus without complications: Secondary | ICD-10-CM | POA: Diagnosis not present

## 2017-06-27 DIAGNOSIS — M1611 Unilateral primary osteoarthritis, right hip: Secondary | ICD-10-CM

## 2017-06-27 DIAGNOSIS — I10 Essential (primary) hypertension: Secondary | ICD-10-CM | POA: Diagnosis not present

## 2017-06-27 DIAGNOSIS — D62 Acute posthemorrhagic anemia: Secondary | ICD-10-CM | POA: Diagnosis not present

## 2017-06-27 DIAGNOSIS — Z96641 Presence of right artificial hip joint: Secondary | ICD-10-CM

## 2017-06-27 NOTE — Progress Notes (Signed)
Provider: Randon Goldsmith. Lyn Hollingshead, MD Location:  [facility] Room Number: 515-P Place of Service:  SNF ([phone])  PCP: Macy Mis, MD Patient Care Team: Macy Mis, MD as PCP - General (Family Medicine)  Extended Emergency Contact Information Primary Emergency Contact: Milta Deiters [place] Home Phone: [phone] Relation: Sister  Allergies  Allergen Reactions  . Latex Swelling  . Eggs Or Egg-Derived Products Other (See Comments)    Reported by [facility] 11/24/11 - unknown reaction  . Losartan Swelling    Swelling of lips and face  . Other Other (See Comments)    Hives and facial swelling from all nuts (tree nuts and peanuts)  . Peanut-Containing Drug Products Hives and Swelling    Facial swelling  . Penicillins Other (See Comments)    Childhood allergy- convulstions Has patient had a PCN reaction causing immediate rash, facial/tongue/throat swelling, SOB or lightheadedness with hypotension: Yes Has patient had a PCN reaction causing severe rash involving mucus membranes or skin necrosis: No Has patient had a PCN reaction that required hospitalization: Yes Has patient had a PCN reaction occurring within the last 10 years: No If all of the above answers are "NO", then may proceed with Cephalosporin use.    Chief Complaint  Patient presents with  . Discharge Note    Discharge from [facility]     HPI:  71 y.o. female with hypertension, hypothyroidism, anemia, history of AVMs with bleed, and osteoarthritis of right hip who was admitted to [facility] from 4/16-19 for a planned total right hip arthroplasty.  Postop hemoglobin was 7.7, patient received a unit of packed RBCs after which patient's hemoglobin was 9.6 apparently there were no further complications and patient was admitted to skilled nursing facility for OT/PT.  Patient is now ready to be discharged home.    Past Medical History:  Diagnosis Date  . Acute  blood loss anemia   . Angioedema 2018  . Asthma    not current  . AVM (arteriovenous malformation) of small bowel, acquired    Jejunum - capsule endo 09/2016  . DVT (deep venous thrombosis) (HCC) 06/2016  . Dyspnea    with exertion  . Eczema   . Eczema   . GERD (gastroesophageal reflux disease) 08/17/2016  . GI bleed 12/04/2016  . Gout   . History of blood transfusion 11/2016   GI bleed  . Hyperlipemia   . Hypertension   . Hypothyroidism   . Iron deficiency anemia   . Lip swelling 08/2016  . Melena 12/04/2016  . Open-angle glaucoma   . Osteoarthritis   . Pre-diabetes    05/13/17- "Dr said she will probably stop it"  . Right hip pain 12/07/2016  . Right knee pain 12/07/2016  . Vitamin D deficiency     Past Surgical History:  Procedure Laterality Date  . ABDOMINAL HYSTERECTOMY    . COLONOSCOPY  2018   GA - one polyp  . ENTEROSCOPY N/A 12/06/2016   Procedure: ENTEROSCOPY;  Surgeon: Sherrilyn Rist, MD;  Location: [facility] ENDOSCOPY;  Service: Gastroenterology;  Laterality: N/A;  . ESOPHAGOGASTRODUODENOSCOPY  2018   GA - gastritis  . GIVENS CAPSULE STUDY  2018   GA - jejunal AVM's, gastritis 2h 6 min small bowel passage, AVM first at 1 hr post duodenum  . SMALL BOWEL ENTEROSCOPY  03/21/2017  . THYROIDECTOMY    . TOTAL HIP ARTHROPLASTY Right 05/24/2017   Procedure: RIGHT TOTAL HIP  ARTHROPLASTY ANTERIOR APPROACH;  Surgeon: Kathryne Hitch, MD;  Location: [facility] OR;  Service: Orthopedics;  Laterality: Right;     reports that she has never smoked. She has never used smokeless tobacco. She reports that she does not drink alcohol or use drugs. Social History   Socioeconomic History  . Marital status: Single    Spouse name: Not on file  . Number of children: 2  . Years of education: 95  . Highest education level: Not on file  Occupational History  . Occupation: retired Child psychotherapist  Social Needs  . Financial resource strain: Not on file  . Food insecurity:    Worry:  Not on file    Inability: Not on file  . Transportation needs:    Medical: Not on file    Non-medical: Not on file  Tobacco Use  . Smoking status: Never Smoker  . Smokeless tobacco: Never Used  Substance and Sexual Activity  . Alcohol use: No  . Drug use: No  . Sexual activity: Not on file  Lifestyle  . Physical activity:    Days per week: Not on file    Minutes per session: Not on file  . Stress: Not on file  Relationships  . Social connections:    Talks on phone: Not on file    Gets together: Not on file    Attends religious service: Not on file    Active member of club or organization: Not on file    Attends meetings of clubs or organizations: Not on file    Relationship status: Not on file  . Intimate partner violence:    Fear of current or ex partner: Not on file    Emotionally abused: Not on file    Physically abused: Not on file    Forced sexual activity: Not on file  Other Topics Concern  . Not on file  Social History Narrative   HS graduate   Retired [employer]   Son lives [place]   Daughter in [place] - 4--# septic shock - deceased? - was in [place] caring for her   Admitted to [facility] 12/10/16   Never married   Never smoked   Alcohol none   Full Code    Pertinent  Health Maintenance Due  Topic Date Due  . FOOT EXAM  10/31/1957  . OPHTHALMOLOGY EXAM  10/31/1957  . URINE MICROALBUMIN  10/31/1957  . MAMMOGRAM  10/31/1997  . DEXA SCAN  10/31/2012  . PNA vac Low Risk Adult (1 of 2 - PCV13) 04/05/2013  . INFLUENZA VACCINE  09/08/2017  . HEMOGLOBIN A1C  11/12/2017  . COLONOSCOPY  06/18/2026    Medications: Allergies as of 06/27/2017      Reactions   Latex Swelling   Eggs Or Egg-derived Products Other (See Comments)   Reported by [facility] 11/24/11 - unknown reaction   Losartan Swelling   Swelling of lips and face   Other Other (See Comments)   Hives and facial swelling from all nuts (tree nuts and peanuts)    Peanut-containing Drug Products Hives, Swelling   Facial swelling   Penicillins Other (See Comments)   Childhood allergy- convulstions Has patient had a PCN reaction causing immediate rash, facial/tongue/throat swelling, SOB or lightheadedness with hypotension: Yes Has patient had a PCN reaction causing severe rash involving mucus membranes or skin necrosis: No Has patient had a PCN reaction that required hospitalization: Yes Has patient had a PCN  reaction occurring within the last 10 years: No If all of the above answers are "NO", then may proceed with Cephalosporin use.      Medication List        Accurate as of 06/27/17 11:59 PM. Always use your most recent med list.          allopurinol 100 MG tablet Commonly known as:  ZYLOPRIM Take 100 mg daily by mouth.   amLODipine 5 MG tablet Commonly known as:  NORVASC Take 5 mg by mouth daily.   aspirin 81 MG chewable tablet Commonly known as:  ASPIRIN CHILDRENS Chew 1 tablet (81 mg total) by mouth 2 (two) times daily after a meal.   famotidine 40 MG tablet Commonly known as:  PEPCID Take 1 tablet (40 mg total) by mouth daily.   ferrous sulfate 325 (65 FE) MG tablet Take 1 tablet (325 mg total) by mouth daily.   levothyroxine 112 MCG tablet Commonly known as:  SYNTHROID, LEVOTHROID Take 112 mcg by mouth daily before lunch.   metFORMIN 750 MG 24 hr tablet Commonly known as:  GLUCOPHAGE-XR Take 750 mg by mouth 2 (two) times daily.   methocarbamol 500 MG tablet Commonly known as:  ROBAXIN Take 1 tablet (500 mg total) by mouth every 6 (six) hours as needed for muscle spasms.   metoprolol succinate 50 MG 24 hr tablet Commonly known as:  TOPROL-XL Take 50 mg by mouth daily. Take with or immediately following a meal.   pravastatin 20 MG tablet Commonly known as:  PRAVACHOL Take 20 mg by mouth at bedtime.   VENTOLIN HFA 108 (90 Base) MCG/ACT inhaler Generic drug:  albuterol Inhale 2 puffs into the lungs every 6 (six)  hours as needed for wheezing.        Vitals:   06/27/17 1155  BP: 132/81  Pulse: 67  Resp: 16  Temp: 98 F (36.7 C)  SpO2: 92%  Weight: 172 lb (78 kg)  Height:  (1.6 m)   Body mass index is 30.47 kg/m.  Physical Exam  GENERAL APPEARANCE: Alert, conversant. No acute distress.  HEENT: Unremarkable. RESPIRATORY: Breathing is even, unlabored. Lung sounds are clear   CARDIOVASCULAR: Heart RRR no murmurs, rubs or gallops. No peripheral edema.  GASTROINTESTINAL: Abdomen is soft, non-tender, not distended w/ normal bowel sounds.  NEUROLOGIC: Cranial nerves 2-12 grossly intact. Moves all extremities   Labs reviewed: Basic Metabolic Panel: Recent Labs    12/06/16 0908 12/07/16 0510 12/08/16 0541  02/18/17 1216 05/13/17 0905 05/25/17 0522 05/28/17  NA 140 139 137   < > 141 137 136 141  K 3.8 3.6 4.1   < > 3.4* 3.8 4.5 4.0  CL 104 106 103   < > 102 104 104  --   CO2 < > --   GLUCOSE 97 106* 102*   < > 91 97 114*  --   BUN < > CREATININE 0.73 0.72 0.75   < > 0.45 0.46 0.59 0.4*  CALCIUM 9.1 8.7* 8.9   < > 9.8 9.7 9.4  --   MG 1.6* 1.5* 1.8  --   --   --   --   --   PHOS 3.3 3.5  --   --   --   --   --   --    < > = values in this interval not displayed.  No results found for: [facility] Liver Function Tests: Recent Labs    12/08/16 0541 12/09/16 0654 02/18/17 1216  AST ALT 11* 11* 10  ALKPHOS 70 73 78  BILITOT 0.6 1.2 0.4  PROT 6.0* 6.5 8.0  ALBUMIN 2.7* 2.7* 3.9   Recent Labs    12/04/16 1251  LIPASE 16   No results for input(s): AMMONIA in the last 8760 hours. CBC: Recent Labs    12/10/16 0400  02/18/17 1122 05/13/17 0905 05/25/17 0522 05/26/17 0627 05/28/17  WBC 10.4   < > 7.6 7.0 9.4 11.7* 9.6  NEUTROABS 7.3  --  5.1  --   --   --  6  HGB 9.0*   < > 10.1* 9.1* 7.7* 9.6* 8.8*  HCT 28.8*   < > 33.3* 30.6* 25.9* 29.6* 28*  MCV 83.2  --  71.3* 69.7* 70.6* 74.2*  --   PLT 400   < > 527.0*  430* 446* 340 342   < > = values in this interval not displayed.   Lipid No results for input(s): CHOL, HDL, LDLCALC, TRIG in the last 8760 hours. Cardiac Enzymes: No results for input(s): CKTOTAL, CKMB, CKMBINDEX, TROPONINI in the last 8760 hours. BNP: No results for input(s): BNP in the last 8760 hours. CBG: Recent Labs    05/13/17 0820 05/24/17 1038 05/24/17 1409  GLUCAP 96 83 77    Procedures and Imaging Studies During Stay: No results found.  Assessment/Plan:   Unilateral primary osteoarthritis, right hip  Status post total replacement of right hip  Acute blood loss as cause of postoperative anemia  Diabetes mellitus without complication (HCC)  Essential hypertension  Hyperlipidemia, unspecified hyperlipidemia type  Hypothyroidism due to acquired atrophy of thyroid   Patient is being discharged with the following home health services: PT/OT/nursing  Patient is being discharged with the following durable medical equipment: None  Patient has been advised to f/u with their PCP in 1-2 weeks to bring them up to date on their rehab stay.  Social services at facility was responsible for arranging this appointment.  Pt was provided with a 30 day supply of prescriptions for medications and refills must be obtained from their PCP.  For controlled substances, a more limited supply may be provided adequate until PCP appointment only.  Medications have been reconciled.  Time spent greater than 30 minutes;> 50% of time with patient was spent reviewing records, labs, tests and studies, counseling and developing plan of care  Randon Goldsmith. Lyn Hollingshead, MD.

## 2017-06-28 ENCOUNTER — Encounter: Payer: Self-pay | Admitting: Internal Medicine

## 2017-07-04 ENCOUNTER — Encounter: Payer: Self-pay | Admitting: Internal Medicine

## 2017-07-04 NOTE — Assessment & Plan Note (Signed)
Controlled; continue with Norvasc 5 mg p.o. daily and Toprol-XL 50 mg daily

## 2017-07-04 NOTE — Assessment & Plan Note (Signed)
No complaint of problems or reflux; continue Pepcid 40 mg daily

## 2017-07-04 NOTE — Assessment & Plan Note (Signed)
TSH 1.46; continue Synthroid 112 mcg daily

## 2017-07-05 ENCOUNTER — Other Ambulatory Visit: Payer: Self-pay | Admitting: Family Medicine

## 2017-07-05 DIAGNOSIS — Z1231 Encounter for screening mammogram for malignant neoplasm of breast: Secondary | ICD-10-CM

## 2017-07-05 DIAGNOSIS — E2839 Other primary ovarian failure: Secondary | ICD-10-CM

## 2017-07-12 ENCOUNTER — Ambulatory Visit (INDEPENDENT_AMBULATORY_CARE_PROVIDER_SITE_OTHER): Payer: Medicare Other

## 2017-07-12 ENCOUNTER — Ambulatory Visit (INDEPENDENT_AMBULATORY_CARE_PROVIDER_SITE_OTHER): Payer: Medicare Other | Admitting: Orthopaedic Surgery

## 2017-07-12 ENCOUNTER — Encounter (INDEPENDENT_AMBULATORY_CARE_PROVIDER_SITE_OTHER): Payer: Self-pay | Admitting: Orthopaedic Surgery

## 2017-07-12 DIAGNOSIS — M25511 Pain in right shoulder: Secondary | ICD-10-CM | POA: Diagnosis not present

## 2017-07-12 DIAGNOSIS — M1612 Unilateral primary osteoarthritis, left hip: Secondary | ICD-10-CM | POA: Diagnosis not present

## 2017-07-12 DIAGNOSIS — M25552 Pain in left hip: Secondary | ICD-10-CM

## 2017-07-12 MED ORDER — LIDOCAINE HCL 1 % IJ SOLN
3.0000 mL | INTRAMUSCULAR | Status: AC | PRN
  Administered 2017-07-12: 3 mL

## 2017-07-12 MED ORDER — METHYLPREDNISOLONE ACETATE 40 MG/ML IJ SUSP
40.0000 mg | INTRAMUSCULAR | Status: AC | PRN
  Administered 2017-07-12: 40 mg via INTRA_ARTICULAR

## 2017-07-12 NOTE — Progress Notes (Signed)
Office Visit Note   Patient: Dana Nelson           Date of Birth: [birth year]           MRN: [id number] Visit Date: 07/12/2017              Requested by: Macy Mis, MD [address] [place], [place] [address] PCP: Macy Mis, MD   Assessment & Plan: Visit Diagnoses:  1. Pain in left hip   2. Unilateral primary osteoarthritis, left hip   3. Right shoulder pain, unspecified chronicity     Plan: Plan I have her do pendulum exercises forward flexion exercises and Codman exercises as shown in the office today for the right shoulder.  We will set her up for a left total hip arthroplasty in the near future.  She understands risk benefits of surgery fully she just underwent the right total hip arthroplasty.  She did go to rehab postop for approximately 30 days.  Questions encouraged and answered at length.  See her back 2 weeks postop  Follow-Up Instructions: Return for 2 weeks post op.   Orders:  Orders Placed This Encounter  Procedures  . XR HIP UNILAT W OR W/O PELVIS 1V LEFT   No orders of the defined types were placed in this encounter.     Procedures: Large Joint Inj: R subacromial bursa on 07/12/2017 10:35 AM Indications: pain Details: 22 G 1.5 in needle, superior approach  Arthrogram: No  Medications: 3 mL lidocaine 1 %; 40 mg methylPREDNISolone acetate 40 MG/ML Outcome: tolerated well, no immediate complications Procedure, treatment alternatives, risks and benefits explained, specific risks discussed. Consent was given by the patient. Immediately prior to procedure a time out was called to verify the correct patient, procedure, equipment, support staff and site/side marked as required. Patient was prepped and draped in the usual sterile fashion.       Clinical Data: No additional findings.   Subjective: Chief Complaint  Patient presents with  . Right Hip - Routine Post Op  . Left Knee - Follow-up    HPI Dana Nelson returns today now  49 days status post right total hip arthroplasty.  She was seen by Dr. Magnus Ivan few weeks ago and was given injection in his left knee which is greatly helped with the pain.  She is complaining mostly of left hip pain which she has known severe osteoarthritis.  She is requesting to have the left total hip done soon as possible.  Pain in the hip is keeping her from ambulating for any distance.  She is also having right shoulder pain which started since the right total hip surgery.  Said no known injury to the shoulder.  She notes decreased range of motion of the shoulder.  She is applying a lot of weight on the shoulder using a rolling walker. Review of Systems Denies any chest pain, calf pain, shortness of breath, nausea, vomiting, fevers or chills.  Objective: Vital Signs: There were no vitals taken for this visit.  Physical Exam  Constitutional: She is oriented to person, place, and time. She appears well-developed and well-nourished. No distress.  Pulmonary/Chest: Effort normal.  Neurological: She is alert and oriented to person, place, and time.  Skin: She is not diaphoretic.  Psychiatric: She has a normal mood and affect.    Ortho Exam Right hip good range of motion without pain.  Surgical incisions well-healed.  Calf supple nontender bilaterally.  Left hip any attempts at  internal/external rotation causes severe pain. Right shoulder she is able to actively forward flex to 90 degrees.  She has weakness with external and internal rotation against resistance.  Unable to do empty can test bilaterally due to weakness.  Unable to fully forward flex her right shoulder to 180 degrees but this causes significant pain. Specialty Comments:  No specialty comments available.  Imaging: Xr Hip Unilat W Or W/o Pelvis 1v Left  Result Date: 07/12/2017 Lateral view left hip: No acute fractures.  Severe end-stage osteoarthritis of the left hip.  No other bony maladies    PMFS History: Patient Active  Problem List   Diagnosis Date Noted  . Status post total replacement of right hip 05/24/2017  . Unilateral primary osteoarthritis, right hip 04/19/2017  . Unilateral primary osteoarthritis, left hip 04/19/2017  . Bilateral primary osteoarthritis of hip 04/08/2017  . Chronic anticoagulation 12/27/2016  . Hypomagnesemia 12/27/2016  . Diabetes mellitus without complication (HCC) 12/18/2016  . Osteoarthritis 12/18/2016  . Right knee pain 12/07/2016  . Right hip pain 12/07/2016  . AVM (arteriovenous malformation) of small bowel, acquired   . Hypokalemia 12/04/2016  . Melena 12/04/2016  . GI bleed 12/04/2016  . Hematochezia   . Acute blood loss as cause of postoperative anemia   . Chronic iron deficiency anemia   . Angioedema 08/17/2016  . Blood loss anemia 08/17/2016  . DVT (deep venous thrombosis) (HCC) 08/17/2016  . HTN (hypertension) 08/17/2016  . Hyperlipidemia 08/17/2016  . Microcytic anemia 08/17/2016  . Hypothyroidism 08/17/2016  . GERD (gastroesophageal reflux disease) 08/17/2016   Past Medical History:  Diagnosis Date  . Acute blood loss anemia   . Angioedema 2018  . Asthma    not current  . AVM (arteriovenous malformation) of small bowel, acquired    Jejunum - capsule endo 09/2016  . DVT (deep venous thrombosis) (HCC) 06/2016  . Dyspnea    with exertion  . Eczema   . Eczema   . GERD (gastroesophageal reflux disease) 08/17/2016  . GI bleed 12/04/2016  . Gout   . History of blood transfusion 11/2016   GI bleed  . Hyperlipemia   . Hypertension   . Hypothyroidism   . Iron deficiency anemia   . Lip swelling 08/2016  . Melena 12/04/2016  . Open-angle glaucoma   . Osteoarthritis   . Pre-diabetes    05/13/17- "Dr said she will probably stop it"  . Right hip pain 12/07/2016  . Right knee pain 12/07/2016  . Vitamin D deficiency     Family History  Problem Relation Age of Onset  . Dementia Mother        died at 4086  . Hypertension Mother   . Alzheimer's disease  Mother   . Heart attack Father        died 2593  . Hypertension Father   . Ovarian cancer Sister   . Heart attack Brother   . Asthma Son   . Heart attack Sister     Past Surgical History:  Procedure Laterality Date  . ABDOMINAL HYSTERECTOMY    . COLONOSCOPY  2018   GA - one polyp  . ENTEROSCOPY N/A 12/06/2016   Procedure: ENTEROSCOPY;  Surgeon: Sherrilyn Ristanis, Henry L III, MD;  [facility] END[facility]COPY;  Service: Gastroenterology;  Laterality: N/A;  . ESOPHAGOGASTRODUODENOSCOPY  2018   GA - gastritis  . GIVENS CAPSULE STUDY  2018   GA - jejunal AVM's, gastritis 2h 6 min small bowel passage, AVM first at 1 hr post duodenum  .  SMALL BOWEL ENTEROSCOPY  03/21/2017  . THYROIDECTOMY    . TOTAL HIP ARTHROPLASTY Right 05/24/2017   Procedure: RIGHT TOTAL HIP ARTHROPLASTY ANTERIOR APPROACH;  Surgeon: Kathryne Hitch, MD;  Location: [facility] OR;  Service: Orthopedics;  Laterality: Right;   Social History   Occupational History  . Occupation: retired Child psychotherapist  Tobacco Use  . Smoking status: Never Smoker  . Smokeless tobacco: Never Used  Substance and Sexual Activity  . Alcohol use: No  . Drug use: No  . Sexual activity: Not on file

## 2017-07-28 ENCOUNTER — Other Ambulatory Visit (INDEPENDENT_AMBULATORY_CARE_PROVIDER_SITE_OTHER): Payer: Self-pay

## 2017-08-03 ENCOUNTER — Other Ambulatory Visit (INDEPENDENT_AMBULATORY_CARE_PROVIDER_SITE_OTHER): Payer: Self-pay | Admitting: Physician Assistant

## 2017-08-05 NOTE — Pre-Procedure Instructions (Signed)
Sophiya Morello  08/05/2017      [facility] - [place], [place] - [address] AT [facility] OF [address] [place] [address] Phone: [phone] Fax: [phone]    Your procedure is scheduled on Tuesday July 9.  Report to [facility] Admitting at 10:30 A.M.  Call this number if you have problems the morning of surgery:  [phone]   Remember:  Do not eat or drink after midnight.    Take these medicines the morning of surgery with A SIP OF WATER:   Metoprolol (Toprol-XL) Amlodipine (Norvasc) Allopurinol (zyloprim) Hydralazine (apresoline) Levothyroxine (synthroid) Robaxin if needed Famotidine (pepcid) if needed Ventolin inhaler- please bring to hospital with you  DO NOT TAKE metformin (glucophage) the day of srurgery  7 days prior to surgery STOP taking any  Aleve, Naproxen, Ibuprofen, Motrin, Advil, Goody's, BC's, all herbal medications, fish oil, and all vitamins  **Follow your instructions on stopping Aspirin. If no instructions were given, please call your surgeon's office**       How to Manage Your Diabetes Before and After Surgery  Why is it important to control my blood sugar before and after surgery? . Improving blood sugar levels before and after surgery helps healing and can limit problems. . A way of improving blood sugar control is eating a healthy diet by: o  Eating less sugar and carbohydrates o  Increasing activity/exercise o  Talking with your doctor about reaching your blood sugar goals . High blood sugars (greater than 180 mg/dL) can raise your risk of infections and slow your recovery, so you will need to focus on controlling your diabetes during the weeks before surgery. . Make sure that the doctor who takes care of your diabetes knows about your planned surgery including the date and location.  How do I manage my blood sugar before surgery? . Check your blood sugar at least 4 times a  day, starting 2 days before surgery, to make sure that the level is not too high or low. o Check your blood sugar the morning of your surgery when you wake up and every 2 hours until you get to the Short Stay unit. . If your blood sugar is less than 70 mg/dL, you will need to treat for low blood sugar: o Do not take insulin. o Treat a low blood sugar (less than 70 mg/dL) with  cup of clear juice (cranberry or apple), 4 glucose tablets, OR glucose gel. Recheck blood sugar in 15 minutes after treatment (to make sure it is greater than 70 mg/dL). If your blood sugar is not greater than 70 mg/dL on recheck, call [phone] o  for further instructions. . Report your blood sugar to the short stay nurse when you get to Short Stay.  . If you are admitted to the hospital after surgery: o Your blood sugar will be checked by the staff and you will probably be given insulin after surgery (instead of oral diabetes medicines) to make sure you have good blood sugar levels. o The goal for blood sugar control after surgery is 80-180 mg/dL.              Do not wear jewelry, make-up or nail polish.  Do not wear lotions, powders, or perfumes, or deodorant.  Do not shave 48 hours prior to surgery.  Men may shave face and neck.  Do not bring valuables to the hospital.  [facility] is not  responsible for any belongings or valuables.  Contacts, dentures or bridgework may not be worn into surgery.  Leave your suitcase in the car.  After surgery it may be brought to your room.  For patients admitted to the hospital, discharge time will be determined by your treatment team.  Patients discharged the day of surgery will not be allowed to drive home.   Special instructions:    [facility]- Preparing For Surgery  Before surgery, you can play an important role. Because skin is not sterile, your skin needs to be as free of germs as possible. You can reduce the number of germs on your skin by washing with  CHG (chlorahexidine gluconate) Soap before surgery.  CHG is an antiseptic cleaner which kills germs and bonds with the skin to continue killing germs even after washing.    Oral Hygiene is also important to reduce your risk of infection.  Remember - BRUSH YOUR TEETH THE MORNING OF SURGERY WITH YOUR REGULAR TOOTHPASTE  Please do not use if you have an allergy to CHG or antibacterial soaps. If your skin becomes reddened/irritated stop using the CHG.  Do not shave (including legs and underarms) for at least 48 hours prior to first CHG shower. It is OK to shave your face.  Please follow these instructions carefully.   1. Shower the NIGHT BEFORE SURGERY and the MORNING OF SURGERY with CHG.   2. If you chose to wash your hair, wash your hair first as usual with your normal shampoo.  3. After you shampoo, rinse your hair and body thoroughly to remove the shampoo.  4. Use CHG as you would any other liquid soap. You can apply CHG directly to the skin and wash gently with a scrungie or a clean washcloth.   5. Apply the CHG Soap to your body ONLY FROM THE NECK DOWN.  Do not use on open wounds or open sores. Avoid contact with your eyes, ears, mouth and genitals (private parts). Wash Face and genitals (private parts)  with your normal soap.  6. Wash thoroughly, paying special attention to the area where your surgery will be performed.  7. Thoroughly rinse your body with warm water from the neck down.  8. DO NOT shower/wash with your normal soap after using and rinsing off the CHG Soap.  9. Pat yourself dry with a CLEAN TOWEL.  10. Wear CLEAN PAJAMAS to bed the night before surgery, wear comfortable clothes the morning of surgery  11. Place CLEAN SHEETS on your bed the night of your first shower and DO NOT SLEEP WITH PETS.    Day of Surgery:  Do not apply any deodorants/lotions.  Please wear clean clothes to the hospital/surgery center.   Remember to brush your teeth WITH YOUR REGULAR  TOOTHPASTE.    Please read over the following fact sheets that you were given. Coughing and Deep Breathing, Total Joint Packet, MRSA Information and Surgical Site Infection Prevention

## 2017-08-08 ENCOUNTER — Encounter (HOSPITAL_COMMUNITY)
Admission: RE | Admit: 2017-08-08 | Discharge: 2017-08-08 | Disposition: A | Discharge status: Home / Self Care | Payer: Medicare Other | Source: Ambulatory Visit | Attending: Orthopaedic Surgery | Admitting: Orthopaedic Surgery

## 2017-08-08 ENCOUNTER — Encounter (HOSPITAL_COMMUNITY): Payer: Self-pay

## 2017-08-08 ENCOUNTER — Other Ambulatory Visit: Payer: Self-pay

## 2017-08-08 DIAGNOSIS — Z01812 Encounter for preprocedural laboratory examination: Secondary | ICD-10-CM | POA: Diagnosis present

## 2017-08-08 LAB — GLUCOSE, CAPILLARY: Glucose-Capillary: 77 mg/dL (ref 70–99)

## 2017-08-08 LAB — BASIC METABOLIC PANEL
Anion gap: 13 (ref 5–15)
BUN: 12 mg/dL (ref 8–23)
CALCIUM: 9.7 mg/dL (ref 8.9–10.3)
CO2: 23 mmol/L (ref 22–32)
CREATININE: 0.59 mg/dL (ref 0.44–1.00)
Chloride: 104 mmol/L (ref 98–111)
GFR calc Af Amer: >60 mL/min (ref >60)
GFR calc non Af Amer: >60 mL/min (ref >60)
GLUCOSE: 91 mg/dL (ref 70–99)
Potassium: 3.8 mmol/L (ref 3.5–5.1)
Sodium: 140 mmol/L (ref 135–145)

## 2017-08-08 LAB — CBC
HCT: 38.7 % (ref 36.0–46.0)
Hemoglobin: 11.7 g/dL — ABNORMAL LOW (ref 12.0–15.0)
MCH: 23 pg — AB (ref 26.0–34.0)
MCHC: 30.2 g/dL (ref 30.0–36.0)
MCV: 76.2 fL — ABNORMAL LOW (ref 78.0–100.0)
PLATELETS: 329 10*3/uL (ref 150–400)
RBC: 5.08 MIL/uL (ref 3.87–5.11)
RDW: 18.8 % — AB (ref 11.5–15.5)
WBC: 7.3 10*3/uL (ref 4.0–10.5)

## 2017-08-08 LAB — HEMOGLOBIN A1C
HEMOGLOBIN A1C: 5.5 % (ref 4.8–5.6)
Mean Plasma Glucose: 111.15 mg/dL

## 2017-08-08 LAB — SURGICAL PCR SCREEN
MRSA, PCR: NEGATIVE
STAPHYLOCOCCUS AUREUS: NEGATIVE

## 2017-08-08 NOTE — Progress Notes (Signed)
PCP - Delbert HarnessKim Briscoe Cardiologist - denies  Chest x-ray - N/A EKG - 12/05/16  Fasting Blood Sugar - 70-110 usually (pt states rarely over 100)  Aspirin Instructions: Left message for Sherrie at Dr. Eliberto IvoryBlackman's office to call pt regarding when to stop taking Aspirin.   Anesthesia review: see previous note from [place], pt reports last hip surgery went well with no complications, no new medical problems since  Patient denies shortness of breath, fever, cough and chest pain at PAT appointment   Patient verbalized understanding of instructions that were given to them at the PAT appointment. Patient was also instructed that they will need to review over the PAT instructions again at home before surgery.

## 2017-08-16 ENCOUNTER — Inpatient Hospital Stay (HOSPITAL_COMMUNITY): Payer: Medicare Other | Admitting: Emergency Medicine

## 2017-08-16 ENCOUNTER — Encounter (HOSPITAL_COMMUNITY): Payer: Self-pay | Admitting: *Deleted

## 2017-08-16 ENCOUNTER — Ambulatory Visit: Payer: Medicare Other

## 2017-08-16 ENCOUNTER — Other Ambulatory Visit: Payer: Self-pay

## 2017-08-16 ENCOUNTER — Inpatient Hospital Stay (HOSPITAL_COMMUNITY)
Admission: RE | Admit: 2017-08-16 | Discharge: 2017-08-19 | DRG: 470 | Disposition: A | Discharge status: Skilled Nursing Facility | Payer: Medicare Other | Attending: Orthopaedic Surgery | Admitting: Orthopaedic Surgery

## 2017-08-16 ENCOUNTER — Inpatient Hospital Stay (HOSPITAL_COMMUNITY): Payer: Medicare Other | Admitting: Certified Registered Nurse Anesthetist

## 2017-08-16 ENCOUNTER — Inpatient Hospital Stay (HOSPITAL_COMMUNITY): Payer: Medicare Other

## 2017-08-16 ENCOUNTER — Inpatient Hospital Stay: Admission: RE | Admit: 2017-08-16 | Payer: Medicare Other | Source: Ambulatory Visit

## 2017-08-16 ENCOUNTER — Encounter (HOSPITAL_COMMUNITY)
Admission: RE | Disposition: A | Discharge status: Skilled Nursing Facility | Payer: Self-pay | Source: Home / Self Care | Attending: Orthopaedic Surgery

## 2017-08-16 DIAGNOSIS — D62 Acute posthemorrhagic anemia: Secondary | ICD-10-CM | POA: Diagnosis not present

## 2017-08-16 DIAGNOSIS — M109 Gout, unspecified: Secondary | ICD-10-CM | POA: Diagnosis present

## 2017-08-16 DIAGNOSIS — Z91010 Allergy to peanuts: Secondary | ICD-10-CM | POA: Diagnosis not present

## 2017-08-16 DIAGNOSIS — Z91040 Latex allergy status: Secondary | ICD-10-CM

## 2017-08-16 DIAGNOSIS — Z825 Family history of asthma and other chronic lower respiratory diseases: Secondary | ICD-10-CM | POA: Diagnosis not present

## 2017-08-16 DIAGNOSIS — E119 Type 2 diabetes mellitus without complications: Secondary | ICD-10-CM | POA: Diagnosis present

## 2017-08-16 DIAGNOSIS — K219 Gastro-esophageal reflux disease without esophagitis: Secondary | ICD-10-CM | POA: Diagnosis present

## 2017-08-16 DIAGNOSIS — E890 Postprocedural hypothyroidism: Secondary | ICD-10-CM | POA: Diagnosis present

## 2017-08-16 DIAGNOSIS — Z91012 Allergy to eggs: Secondary | ICD-10-CM

## 2017-08-16 DIAGNOSIS — I10 Essential (primary) hypertension: Secondary | ICD-10-CM | POA: Diagnosis present

## 2017-08-16 DIAGNOSIS — Z96641 Presence of right artificial hip joint: Secondary | ICD-10-CM | POA: Diagnosis present

## 2017-08-16 DIAGNOSIS — Z880 Allergy status to penicillin: Secondary | ICD-10-CM | POA: Diagnosis not present

## 2017-08-16 DIAGNOSIS — Z419 Encounter for procedure for purposes other than remedying health state, unspecified: Secondary | ICD-10-CM

## 2017-08-16 DIAGNOSIS — E785 Hyperlipidemia, unspecified: Secondary | ICD-10-CM | POA: Diagnosis present

## 2017-08-16 DIAGNOSIS — Z8249 Family history of ischemic heart disease and other diseases of the circulatory system: Secondary | ICD-10-CM | POA: Diagnosis not present

## 2017-08-16 DIAGNOSIS — Z90710 Acquired absence of both cervix and uterus: Secondary | ICD-10-CM

## 2017-08-16 DIAGNOSIS — Z8041 Family history of malignant neoplasm of ovary: Secondary | ICD-10-CM | POA: Diagnosis not present

## 2017-08-16 DIAGNOSIS — M1612 Unilateral primary osteoarthritis, left hip: Secondary | ICD-10-CM | POA: Diagnosis present

## 2017-08-16 DIAGNOSIS — Z96642 Presence of left artificial hip joint: Secondary | ICD-10-CM

## 2017-08-16 HISTORY — PX: TOTAL HIP ARTHROPLASTY: SHX124

## 2017-08-16 LAB — GLUCOSE, CAPILLARY
GLUCOSE-CAPILLARY: 89 mg/dL (ref 70–99)
GLUCOSE-CAPILLARY: 134 mg/dL — AB (ref 70–99)

## 2017-08-16 SURGERY — ARTHROPLASTY, HIP, TOTAL, ANTERIOR APPROACH
Anesthesia: Spinal | Site: Hip | Laterality: Left

## 2017-08-16 MED ORDER — FENTANYL CITRATE (PF) 100 MCG/2ML IJ SOLN: 25.0000 ug | INTRAMUSCULAR | Status: DC | PRN

## 2017-08-16 MED ORDER — METFORMIN HCL ER 750 MG PO TB24
750.0000 mg | ORAL_TABLET | Freq: Two times a day (BID) | ORAL | Status: DC
  Administered 2017-08-16 – 2017-08-19 (×6): 750 mg via ORAL
  Filled 2017-08-16 (×7): qty 1

## 2017-08-16 MED ORDER — ONDANSETRON HCL 4 MG/2ML IJ SOLN
INTRAMUSCULAR | Status: AC
  Filled 2017-08-16: qty 2

## 2017-08-16 MED ORDER — METHOCARBAMOL 1000 MG/10ML IJ SOLN
500.0000 mg | Freq: Four times a day (QID) | INTRAVENOUS | Status: DC | PRN
  Filled 2017-08-16: qty 5

## 2017-08-16 MED ORDER — PROPOFOL 10 MG/ML IV BOLUS
INTRAVENOUS | Status: AC
  Filled 2017-08-16: qty 20

## 2017-08-16 MED ORDER — METFORMIN HCL ER 500 MG PO TB24: 750.0000 mg | ORAL_TABLET | Freq: Two times a day (BID) | ORAL | Status: DC

## 2017-08-16 MED ORDER — FAMOTIDINE 20 MG PO TABS
40.0000 mg | ORAL_TABLET | Freq: Every day | ORAL | Status: DC
Start: 2017-08-16 — End: 2017-08-19
  Administered 2017-08-16 – 2017-08-19 (×4): 40 mg via ORAL
  Filled 2017-08-16 (×4): qty 2

## 2017-08-16 MED ORDER — FENTANYL CITRATE (PF) 100 MCG/2ML IJ SOLN
INTRAMUSCULAR | Status: DC | PRN
  Administered 2017-08-16 (×2): 50 ug via INTRAVENOUS

## 2017-08-16 MED ORDER — MIDAZOLAM HCL 2 MG/2ML IJ SOLN
INTRAMUSCULAR | Status: AC
  Filled 2017-08-16: qty 2

## 2017-08-16 MED ORDER — CLINDAMYCIN PHOSPHATE 600 MG/50ML IV SOLN
600.0000 mg | Freq: Four times a day (QID) | INTRAVENOUS | Status: AC
  Administered 2017-08-16 – 2017-08-17 (×2): 600 mg via INTRAVENOUS
  Filled 2017-08-16 (×2): qty 50

## 2017-08-16 MED ORDER — ONDANSETRON HCL 4 MG/2ML IJ SOLN
4.0000 mg | Freq: Four times a day (QID) | INTRAMUSCULAR | Status: DC | PRN
  Administered 2017-08-17: 4 mg via INTRAVENOUS
  Filled 2017-08-16: qty 2

## 2017-08-16 MED ORDER — METOPROLOL SUCCINATE ER 50 MG PO TB24
50.0000 mg | ORAL_TABLET | Freq: Every day | ORAL | Status: DC
  Administered 2017-08-17 – 2017-08-19 (×3): 50 mg via ORAL
  Filled 2017-08-16 (×3): qty 1

## 2017-08-16 MED ORDER — OXYCODONE HCL 5 MG PO TABS: 10.0000 mg | ORAL_TABLET | ORAL | Status: DC | PRN

## 2017-08-16 MED ORDER — FENTANYL CITRATE (PF) 100 MCG/2ML IJ SOLN
INTRAMUSCULAR | Status: AC
  Filled 2017-08-16: qty 2

## 2017-08-16 MED ORDER — DOCUSATE SODIUM 100 MG PO CAPS
100.0000 mg | ORAL_CAPSULE | Freq: Two times a day (BID) | ORAL | Status: DC
  Administered 2017-08-16 – 2017-08-19 (×6): 100 mg via ORAL
  Filled 2017-08-16 (×6): qty 1

## 2017-08-16 MED ORDER — ALLOPURINOL 100 MG PO TABS
100.0000 mg | ORAL_TABLET | Freq: Every day | ORAL | Status: DC
  Administered 2017-08-16 – 2017-08-19 (×4): 100 mg via ORAL
  Filled 2017-08-16 (×4): qty 1

## 2017-08-16 MED ORDER — ACETAMINOPHEN 325 MG PO TABS
325.0000 mg | ORAL_TABLET | Freq: Four times a day (QID) | ORAL | Status: DC | PRN
  Administered 2017-08-17 – 2017-08-19 (×5): 650 mg via ORAL
  Filled 2017-08-16 (×5): qty 2

## 2017-08-16 MED ORDER — FERROUS SULFATE 325 (65 FE) MG PO TABS
325.0000 mg | ORAL_TABLET | Freq: Every day | ORAL | Status: DC
  Administered 2017-08-16 – 2017-08-19 (×4): 325 mg via ORAL
  Filled 2017-08-16 (×4): qty 1

## 2017-08-16 MED ORDER — PROPOFOL 10 MG/ML IV BOLUS
INTRAVENOUS | Status: AC
  Filled 2017-08-16: qty 80

## 2017-08-16 MED ORDER — SODIUM CHLORIDE 0.9 % IV SOLN
INTRAVENOUS | Status: DC
  Administered 2017-08-16 (×2): via INTRAVENOUS

## 2017-08-16 MED ORDER — ALUM & MAG HYDROXIDE-SIMETH 200-200-20 MG/5ML PO SUSP: 30.0000 mL | ORAL | Status: DC | PRN

## 2017-08-16 MED ORDER — METOCLOPRAMIDE HCL 5 MG/ML IJ SOLN: 10.0000 mg | Freq: Once | INTRAMUSCULAR | Status: DC | PRN

## 2017-08-16 MED ORDER — PHENYLEPHRINE 40 MCG/ML (10ML) SYRINGE FOR IV PUSH (FOR BLOOD PRESSURE SUPPORT)
PREFILLED_SYRINGE | INTRAVENOUS | Status: AC
  Filled 2017-08-16: qty 10

## 2017-08-16 MED ORDER — DIPHENHYDRAMINE HCL 12.5 MG/5ML PO ELIX: 12.5000 mg | ORAL_SOLUTION | ORAL | Status: DC | PRN

## 2017-08-16 MED ORDER — PRAVASTATIN SODIUM 20 MG PO TABS
20.0000 mg | ORAL_TABLET | Freq: Every evening | ORAL | Status: DC
  Administered 2017-08-17 – 2017-08-18 (×2): 20 mg via ORAL
  Filled 2017-08-16 (×2): qty 1

## 2017-08-16 MED ORDER — PROPOFOL 500 MG/50ML IV EMUL
INTRAVENOUS | Status: DC | PRN
  Administered 2017-08-16: 25 ug/kg/min via INTRAVENOUS

## 2017-08-16 MED ORDER — LEVOTHYROXINE SODIUM 112 MCG PO TABS
112.0000 ug | ORAL_TABLET | Freq: Every day | ORAL | Status: DC
  Administered 2017-08-17 – 2017-08-19 (×3): 112 ug via ORAL
  Filled 2017-08-16 (×3): qty 1

## 2017-08-16 MED ORDER — PHENYLEPHRINE HCL 10 MG/ML IJ SOLN
INTRAMUSCULAR | Status: DC | PRN
  Administered 2017-08-16: 50 ug/min via INTRAVENOUS

## 2017-08-16 MED ORDER — CHLORHEXIDINE GLUCONATE 4 % EX LIQD: 60.0000 mL | Freq: Once | CUTANEOUS | Status: DC

## 2017-08-16 MED ORDER — MIDAZOLAM HCL 5 MG/5ML IJ SOLN
INTRAMUSCULAR | Status: DC | PRN
  Administered 2017-08-16 (×2): 1 mg via INTRAVENOUS

## 2017-08-16 MED ORDER — POLYETHYLENE GLYCOL 3350 17 G PO PACK: 17.0000 g | PACK | Freq: Every day | ORAL | Status: DC | PRN

## 2017-08-16 MED ORDER — ASPIRIN 81 MG PO CHEW
81.0000 mg | CHEWABLE_TABLET | Freq: Two times a day (BID) | ORAL | Status: DC
  Administered 2017-08-16 – 2017-08-19 (×6): 81 mg via ORAL
  Filled 2017-08-16 (×6): qty 1

## 2017-08-16 MED ORDER — PHENOL 1.4 % MT LIQD: 1.0000 | OROMUCOSAL | Status: DC | PRN

## 2017-08-16 MED ORDER — PROPOFOL 10 MG/ML IV BOLUS
INTRAVENOUS | Status: DC | PRN
  Administered 2017-08-16 (×3): 20 mg via INTRAVENOUS
  Administered 2017-08-16: 40 mg via INTRAVENOUS

## 2017-08-16 MED ORDER — HYDRALAZINE HCL 25 MG PO TABS
25.0000 mg | ORAL_TABLET | Freq: Two times a day (BID) | ORAL | Status: DC
  Administered 2017-08-16 – 2017-08-19 (×6): 25 mg via ORAL
  Filled 2017-08-16 (×6): qty 1

## 2017-08-16 MED ORDER — MENTHOL 3 MG MT LOZG: 1.0000 | LOZENGE | OROMUCOSAL | Status: DC | PRN

## 2017-08-16 MED ORDER — FENTANYL CITRATE (PF) 250 MCG/5ML IJ SOLN
INTRAMUSCULAR | Status: AC
  Filled 2017-08-16: qty 5

## 2017-08-16 MED ORDER — METOCLOPRAMIDE HCL 5 MG PO TABS: 5.0000 mg | ORAL_TABLET | Freq: Three times a day (TID) | ORAL | Status: DC | PRN

## 2017-08-16 MED ORDER — OXYCODONE HCL 5 MG PO TABS
5.0000 mg | ORAL_TABLET | ORAL | Status: DC | PRN
  Administered 2017-08-16 – 2017-08-17 (×3): 5 mg via ORAL
  Filled 2017-08-16 (×4): qty 1

## 2017-08-16 MED ORDER — ALBUTEROL SULFATE (2.5 MG/3ML) 0.083% IN NEBU: 3.0000 mL | INHALATION_SOLUTION | Freq: Four times a day (QID) | RESPIRATORY_TRACT | Status: DC | PRN

## 2017-08-16 MED ORDER — CLINDAMYCIN PHOSPHATE 900 MG/50ML IV SOLN
900.0000 mg | INTRAVENOUS | Status: AC
  Administered 2017-08-16: 900 mg via INTRAVENOUS
  Filled 2017-08-16: qty 50

## 2017-08-16 MED ORDER — METOCLOPRAMIDE HCL 5 MG/ML IJ SOLN: 5.0000 mg | Freq: Three times a day (TID) | INTRAMUSCULAR | Status: DC | PRN

## 2017-08-16 MED ORDER — BUPIVACAINE IN DEXTROSE 0.75-8.25 % IT SOLN
INTRATHECAL | Status: DC | PRN
  Administered 2017-08-16: 1.8 mL via INTRATHECAL

## 2017-08-16 MED ORDER — AMLODIPINE BESYLATE 5 MG PO TABS
5.0000 mg | ORAL_TABLET | Freq: Every day | ORAL | Status: DC
  Administered 2017-08-16 – 2017-08-19 (×4): 5 mg via ORAL
  Filled 2017-08-16 (×4): qty 1

## 2017-08-16 MED ORDER — METHOCARBAMOL 500 MG PO TABS
500.0000 mg | ORAL_TABLET | Freq: Four times a day (QID) | ORAL | Status: DC | PRN
  Administered 2017-08-17: 500 mg via ORAL
  Filled 2017-08-16 (×2): qty 1

## 2017-08-16 MED ORDER — MEPERIDINE HCL 50 MG/ML IJ SOLN: 6.2500 mg | INTRAMUSCULAR | Status: DC | PRN

## 2017-08-16 MED ORDER — LACTATED RINGERS IV SOLN
INTRAVENOUS | Status: DC | PRN
  Administered 2017-08-16: 14:00:00 via INTRAVENOUS

## 2017-08-16 MED ORDER — TRANEXAMIC ACID 1000 MG/10ML IV SOLN
1000.0000 mg | Freq: Once | INTRAVENOUS | Status: DC
  Filled 2017-08-16: qty 10

## 2017-08-16 MED ORDER — HYDROMORPHONE HCL 1 MG/ML IJ SOLN: 0.5000 mg | INTRAMUSCULAR | Status: DC | PRN

## 2017-08-16 MED ORDER — ONDANSETRON HCL 4 MG PO TABS: 4.0000 mg | ORAL_TABLET | Freq: Four times a day (QID) | ORAL | Status: DC | PRN

## 2017-08-16 SURGICAL SUPPLY — 38 items
BAG ZIPLOCK 12X15 (MISCELLANEOUS) IMPLANT
BENZOIN TINCTURE PRP APPL 2/3 (GAUZE/BANDAGES/DRESSINGS) IMPLANT
BLADE SAW SGTL 18X1.27X75 (BLADE) ×2 IMPLANT
BLADE SAW SGTL 18X1.27X75MM (BLADE) ×1
CAPT HIP TOTAL 2 ×3 IMPLANT
CLOSURE WOUND 1/2 X4 (GAUZE/BANDAGES/DRESSINGS)
COVER PERINEAL POST (MISCELLANEOUS) ×3 IMPLANT
COVER SURGICAL LIGHT HANDLE (MISCELLANEOUS) ×3 IMPLANT
DRAPE STERI IOBAN 125X83 (DRAPES) ×3 IMPLANT
DRAPE U-SHAPE 47X51 STRL (DRAPES) ×6 IMPLANT
DRSG AQUACEL AG ADV 3.5X10 (GAUZE/BANDAGES/DRESSINGS) ×3 IMPLANT
DURAPREP 26ML APPLICATOR (WOUND CARE) ×3 IMPLANT
ELECT REM PT RETURN 15FT ADLT (MISCELLANEOUS) ×3 IMPLANT
GAUZE XEROFORM 1X8 LF (GAUZE/BANDAGES/DRESSINGS) ×3 IMPLANT
GLOVE BIO SURGEON STRL SZ7.5 (GLOVE) IMPLANT
GLOVE BIOGEL PI IND STRL 7.0 (GLOVE) ×4 IMPLANT
GLOVE BIOGEL PI IND STRL 7.5 (GLOVE) ×4 IMPLANT
GLOVE BIOGEL PI IND STRL 8 (GLOVE) ×1 IMPLANT
GLOVE BIOGEL PI INDICATOR 7.0 (GLOVE) ×8
GLOVE BIOGEL PI INDICATOR 7.5 (GLOVE) ×8
GLOVE BIOGEL PI INDICATOR 8 (GLOVE) ×2
GLOVE ECLIPSE 8.0 STRL XLNG CF (GLOVE) IMPLANT
GOWN STRL REUS W/TWL XL LVL3 (GOWN DISPOSABLE) ×15 IMPLANT
HANDPIECE INTERPULSE COAX TIP (DISPOSABLE) ×2
HOLDER FOLEY CATH W/STRAP (MISCELLANEOUS) ×3 IMPLANT
PACK ANTERIOR HIP CUSTOM (KITS) ×3 IMPLANT
SET HNDPC FAN SPRY TIP SCT (DISPOSABLE) ×1 IMPLANT
STAPLER VISISTAT 35W (STAPLE) IMPLANT
STRIP CLOSURE SKIN 1/2X4 (GAUZE/BANDAGES/DRESSINGS) IMPLANT
SUT ETHIBOND NAB CT1 #1 30IN (SUTURE) ×3 IMPLANT
SUT MNCRL AB 4-0 PS2 18 (SUTURE) ×3 IMPLANT
SUT VIC AB 0 CT1 36 (SUTURE) ×3 IMPLANT
SUT VIC AB 1 CT1 36 (SUTURE) ×3 IMPLANT
SUT VIC AB 2-0 CT1 27 (SUTURE) ×4
SUT VIC AB 2-0 CT1 TAPERPNT 27 (SUTURE) ×2 IMPLANT
TRAY FOLEY CATH SILVER 16FR LF (SET/KITS/TRAYS/PACK) ×3 IMPLANT
TRAY FOLEY MTR SLVR 16FR STAT (SET/KITS/TRAYS/PACK) IMPLANT
YANKAUER SUCT BULB TIP 10FT TU (MISCELLANEOUS) ×3 IMPLANT

## 2017-08-16 NOTE — H&P (Signed)
TOTAL HIP ADMISSION H&P  Patient is admitted for left total hip arthroplasty.  Subjective:  Chief Complaint: left hip pain  HPI: Dana Nelson, 72 y.o. female, has a history of pain and functional disability in the left hip(s) due to arthritis and patient has failed non-surgical conservative treatments for greater than 12 weeks to include NSAID's and/or analgesics, corticosteriod injections, flexibility and strengthening excercises, supervised PT with diminished ADL's post treatment, use of assistive devices, weight reduction as appropriate and activity modification.  Onset of symptoms was gradual starting 3 years ago with gradually worsening course since that time.The patient noted no past surgery on the left hip(s).  Patient currently rates pain in the left hip at 10 out of 10 with activity. Patient has night pain, worsening of pain with activity and weight bearing, trendelenberg gait, pain that interfers with activities of daily living and pain with passive range of motion. Patient has evidence of subchondral cysts, subchondral sclerosis, periarticular osteophytes and joint space narrowing by imaging studies. This condition presents safety issues increasing the risk of falls.  There is no current active infection.  Patient Active Problem List   Diagnosis Date Noted  . Status post total replacement of right hip 05/24/2017  . Unilateral primary osteoarthritis, right hip 04/19/2017  . Unilateral primary osteoarthritis, left hip 04/19/2017  . Bilateral primary osteoarthritis of hip 04/08/2017  . Chronic anticoagulation 12/27/2016  . Hypomagnesemia 12/27/2016  . Diabetes mellitus without complication (HCC) 12/18/2016  . Osteoarthritis 12/18/2016  . Right knee pain 12/07/2016  . Right hip pain 12/07/2016  . AVM (arteriovenous malformation) of small bowel, acquired   . Hypokalemia 12/04/2016  . Melena 12/04/2016  . GI bleed 12/04/2016  . Hematochezia   . Acute blood loss as cause of  postoperative anemia   . Chronic iron deficiency anemia   . Angioedema 08/17/2016  . Blood loss anemia 08/17/2016  . DVT (deep venous thrombosis) (HCC) 08/17/2016  . HTN (hypertension) 08/17/2016  . Hyperlipidemia 08/17/2016  . Microcytic anemia 08/17/2016  . Hypothyroidism 08/17/2016  . GERD (gastroesophageal reflux disease) 08/17/2016   Past Medical History:  Diagnosis Date  . Acute blood loss anemia   . Angioedema 2018  . Asthma    not current  . AVM (arteriovenous malformation) of small bowel, acquired    Jejunum - capsule endo 09/2016  . DVT (deep venous thrombosis) (HCC) 06/2016  . Dyspnea    with exertion  . Eczema   . Eczema   . GERD (gastroesophageal reflux disease) 08/17/2016  . GI bleed 12/04/2016  . Gout   . History of blood transfusion 11/2016   GI bleed  . Hyperlipemia   . Hypertension   . Hypothyroidism   . Iron deficiency anemia   . Lip swelling 08/2016  . Melena 12/04/2016  . Open-angle glaucoma   . Osteoarthritis   . Pre-diabetes    05/13/17- "Dr said she will probably stop it"  . Right hip pain 12/07/2016  . Right knee pain 12/07/2016  . Vitamin D deficiency     Past Surgical History:  Procedure Laterality Date  . ABDOMINAL HYSTERECTOMY    . COLONOSCOPY  2018   [place] - one polyp  . ENTEROSCOPY N/A 12/06/2016   Procedure: ENTEROSCOPY;  Surgeon: Sherrilyn Rist, MD;  Location: [facility] ENDOSCOPY;  Service: Gastroenterology;  Laterality: N/A;  . ESOPHAGOGASTRODUODENOSCOPY  2018   [place] - gastritis  . GIVENS CAPSULE STUDY  2018   [place] - jejunal AVM's, gastritis 2h 6 min small bowel  passage, AVM first at 1 hr post duodenum  . SMALL BOWEL ENTEROSCOPY  03/21/2017  . THYROIDECTOMY    . TOTAL HIP ARTHROPLASTY Right 05/24/2017   Procedure: RIGHT TOTAL HIP ARTHROPLASTY ANTERIOR APPROACH;  Surgeon: Kathryne HitchBlackman, Min Tunnell Y, MD;  Location: [facility] OR;  Service: Orthopedics;  Laterality: Right;    Current Facility-Administered Medications  Medication Dose Route Frequency  Provider Last Rate Last Dose  . chlorhexidine (HIBICLENS) 4 % liquid 4 application  60 mL Topical Once Richardean Canallark, Gilbert W, PA-C      . clindamycin (CLEOCIN) IVPB 900 mg  900 mg Intravenous On Call to OR Kirtland Bouchardlark, Gilbert W, PA-C       Allergies  Allergen Reactions  . Latex Swelling  . Eggs Or Egg-Derived Products Other (See Comments)    Was told not to eat eggs because she had eczema as a child  . Losartan Swelling    Swelling of lips and face  . Other Other (See Comments)    Hives and facial swelling from all nuts (tree nuts and peanuts)  . Peanut-Containing Drug Products Hives and Swelling    Facial swelling  . Penicillins Other (See Comments)    Childhood allergy- convulstions Has patient had a PCN reaction causing immediate rash, facial/tongue/throat swelling, SOB or lightheadedness with hypotension: Yes Has patient had a PCN reaction causing severe rash involving mucus membranes or skin necrosis: No Has patient had a PCN reaction that required hospitalization: Yes Has patient had a PCN reaction occurring within the last 10 years: No If all of the above answers are "NO", then may proceed with Cephalosporin use.    Social History   Tobacco Use  . Smoking status: Never Smoker  . Smokeless tobacco: Never Used  Substance Use Topics  . Alcohol use: No    Family History  Problem Relation Age of Onset  . Dementia Mother        died at 7586  . Hypertension Mother   . Alzheimer's disease Mother   . Heart attack Father        died 6993  . Hypertension Father   . Ovarian cancer Sister   . Heart attack Brother   . Asthma Son   . Heart attack Sister      Review of Systems  Musculoskeletal: Positive for joint pain.  All other systems reviewed and are negative.   Objective:  Physical Exam  Constitutional: She is oriented to person, place, and time. She appears well-developed and well-nourished.  HENT:  Head: Normocephalic and atraumatic.  Eyes: Pupils are equal, round, and  reactive to light. EOM are normal.  Neck: Normal range of motion. Neck supple.  Cardiovascular: Normal rate and regular rhythm.  Respiratory: Effort normal and breath sounds normal.  GI: Soft. Bowel sounds are normal.  Musculoskeletal:       Left hip: She exhibits decreased range of motion, decreased strength, tenderness and bony tenderness.  Neurological: She is alert and oriented to person, place, and time.  Skin: Skin is warm and dry.  Psychiatric: She has a normal mood and affect.    Vital signs in last 24 hours: Temp:  [97.5 F (36.4 C)] 97.5 F (36.4 C) (07/09 1201) Pulse Rate:  [77] 77 (07/09 1201) Resp:  [18] 18 (07/09 1201) BP: (164)/(93) 164/93 (07/09 1201) SpO2:  [98 %] 98 % (07/09 1201)  Labs:   Estimated body mass index is 29.64 kg/m as calculated from the following:   Height as of 08/08/17: 5\' 3"  (1.6 m).  Weight as of 08/08/17: 167 lb 4.8 oz (75.9 kg).   Imaging Review Plain radiographs demonstrate severe degenerative joint disease of the left hip(s). The bone quality appears to be good for age and reported activity level.    Preoperative templating of the joint replacement has been completed, documented, and submitted to the Operating Room personnel in order to optimize intra-operative equipment management.     Assessment/Plan:  End stage arthritis, left hip(s)  The patient history, physical examination, clinical judgement of the provider and imaging studies are consistent with end stage degenerative joint disease of the left hip(s) and total hip arthroplasty is deemed medically necessary. The treatment options including medical management, injection therapy, arthroscopy and arthroplasty were discussed at length. The risks and benefits of total hip arthroplasty were presented and reviewed. The risks due to aseptic loosening, infection, stiffness, dislocation/subluxation,  thromboembolic complications and other imponderables were discussed.  The patient  acknowledged the explanation, agreed to proceed with the plan and consent was signed. Patient is being admitted for inpatient treatment for surgery, pain control, PT, OT, prophylactic antibiotics, VTE prophylaxis, progressive ambulation and ADL's and discharge planning.The patient is planning to be discharged to skilled nursing facility

## 2017-08-16 NOTE — Transfer of Care (Signed)
Immediate Anesthesia Transfer of Care Note  Patient: Dana EpleyRegina Nelson  Procedure(s) Performed: LEFT TOTAL HIP ARTHROPLASTY ANTERIOR APPROACH (Left Hip)  Patient Location: PACU  Anesthesia Type:Spinal  Level of Consciousness: awake, alert , oriented and patient cooperative  Airway & Oxygen Therapy: Patient Spontanous Breathing and Patient connected to face mask oxygen  Post-op Assessment: Report given to RN, Post -op Vital signs reviewed and stable and Patient moving all extremities  Post vital signs: Reviewed and stable  Last Vitals:  Vitals Value Taken Time  BP 93/48 08/16/2017  3:27 PM  Temp    Pulse 69 08/16/2017  3:29 PM  Resp 13 08/16/2017  3:29 PM  SpO2 98 % 08/16/2017  3:29 PM  Vitals shown include unvalidated device data.  Last Pain:  Vitals:   08/16/17 1245  TempSrc:   PainSc: 0-No pain         Complications: No apparent anesthesia complications

## 2017-08-16 NOTE — Op Note (Signed)
NAMAvis Epley: Seybold, Chaise MEDICAL RECORD NW:[id number] ACCOUNT [id number] DATE OF BIRTH:1945/12/25 FACILITY: [facility] LOCATION: [facility] PHYSICIAN:Keaundra Stehle Aretha ParrotY. Arah Aro, MD  OPERATIVE REPORT  DATE OF PROCEDURE:  08/16/2017  PREOPERATIVE DIAGNOSIS:  Primary osteoarthritis and degenerative joint disease, left hip.  POSTOPERATIVE DIAGNOSIS:  Primary osteoarthritis and degenerative joint disease, left hip.  PROCEDURE:  Left total hip arthroplasty through direct anterior approach.  IMPLANTS:  DePuy Sector Gription acetabular component size 52, size 36+0 polyethylene liner, size 11 Corail femoral component with varus offset, size 36+1.5 metal hip ball.  SURGEON:  Vanita PandaChristopher Y. Magnus IvanBlackman, MD  ASSISTANT:  Richardean CanalGilbert Clark, PA-C.  ANESTHESIA:  Spinal.  ANTIBIOTICS:  900 mg IV clindamycin.  ESTIMATED BLOOD LOSS:  500 mL  COMPLICATIONS:  None.  INDICATIONS:  The patient is a 72 year old female who has debilitating arthritis involving both her hips.  She underwent a successful right total hip arthroplasty a few months ago in April of this year.  Her left hip pain is daily and is detrimentally  affecting her activities of daily living, quality of life and mobility.  Her arthritis is severe on her left hip.  At this point, given the success of her right hip replacement, she wishes to have the left hip performed.  She understands fully the risk  of acute blood loss anemia, nerve or vessel injury, fracture, infection, dislocation, DVT.  She understands our goals are to decrease pain, improve mobility and overall improve quality of life.  DESCRIPTION OF PROCEDURE:  After informed consent was obtained and appropriate left hip was marked.  She was brought to the operating room where spinal anesthesia was obtained while she was on a stretcher.  She was then laid in the supine position on the  stretcher.  Foley catheter was placed.  We judged her leg length showing that her right operative leg previously was  shorter than today's left operative leg.  Traction boots were placed on both her feet.  She was placed supine on the Hana fracture table  with a perineal post in place and both legs in line skeletal traction device and no traction applied.  Her left operative hip was prepped and draped with DuraPrep and sterile drapes.  A time-out was called to identify correct patient, correct left hip.   We then made an incision just inferior and posterior to the anterior superior iliac spine and carried this obliquely down the leg.  We dissected down tensor fascia lata muscle.  Tensor fascia was then divided longitudinally to proceed with direct  anterior approach to the hip.  We identified and cauterized circumflex vessels, identified the hip capsule, opened the capsule L-type format, finding moderate joint effusion and significant arthritis around the left hip.  We then made our femoral neck  cut with an oscillating saw just proximal to the lesser trochanter.  Completed this with an osteotome.  We placed a corkscrew guide in the femoral head and removed the femoral head in its entirety.  We found it to be completely devoid of cartilage.  I  then placed a bent Hohmann into the medial acetabular rim removing remnants of the acetabular labrum and other debris from the acetabulum.  We then began reaming in stepwise increments and under direct visualization from a size 44 reamer going all the  way up to a size 51 with all reamers under direct visualization, the last reamer under direct visualization, so we can assess our depth of reaming our inclination and anteversion.  Once I was pleased with this, I placed  the real DePuy Sector Gription  acetabular component size 52 and a 36+0 neutral polyethylene liner for that size acetabular component.  Attention was then turned to the femur.  With the leg externally rotated to 120 degrees, extended and adducted, we were able to place a Mueller  retractor medially and a Hohmann  retractor around the greater trochanter.  We used a box cutting osteotome to enter femoral canal and a rongeur to lateralize and then began broaching from a size 8 broach using the Corail broaching system up to a size 11.   With a size 11 in place, we tried a varus offset femoral neck based off her anatomy and a 36+1.5 hip ball reducing the acetabulum and  we were pleased with the leg length, offset, range of motion and stability.  We then dislocated the hip and removed  the trial components.  We then placed the real Corail femoral component size 11 with varus offset and the real 36+1.5 metal hip ball again reduced this in the acetabulum.  We were pleased with stability.  We then irrigated the soft tissue with normal  saline solution using pulsatile lavage.  Closed the joint capsule with interrupted #1 Ethibond suture, followed by running #1 Vicryl in tensor fascia, 0 Vicryl in the deep tissue, 2-0 Vicryl subcutaneous tissue and interrupted staples on the skin.   Xeroform and Aquacel dressing were applied.  She was taken off the Hana table and taken to recovery room in stable condition.  All final counts were correct.  There were no complications noted.  Of note, Rexene Edison, PA-C, assisted the entire case.  His  assistance was crucial for facilitating all aspects of this case.  TN/NUANCE  D:08/16/2017 T:08/16/2017 JOB:[id number]

## 2017-08-16 NOTE — Anesthesia Preprocedure Evaluation (Signed)
Anesthesia Evaluation  Patient identified by MRN, date of birth, ID band Patient awake    Reviewed: Allergy & Precautions, NPO status , Patient's Chart, lab work & pertinent test results  Airway Mallampati: II  TM Distance: >3 FB Neck ROM: Full    Dental no notable dental hx.    Pulmonary neg pulmonary ROS,    Pulmonary exam normal breath sounds clear to auscultation       Cardiovascular hypertension, Pt. on medications Normal cardiovascular exam Rhythm:Regular Rate:Normal     Neuro/Psych negative neurological ROS  negative psych ROS   GI/Hepatic negative GI ROS, Neg liver ROS,   Endo/Other  diabetes, Type 2, Oral Hypoglycemic Agents  Renal/GU negative Renal ROS  negative genitourinary   Musculoskeletal negative musculoskeletal ROS (+)   Abdominal   Peds negative pediatric ROS (+)  Hematology negative hematology ROS (+)   Anesthesia Other Findings   Reproductive/Obstetrics negative OB ROS                             Anesthesia Physical Anesthesia Plan  ASA: II  Anesthesia Plan: Spinal   Post-op Pain Management:    Induction:   PONV Risk Score and Plan: 2 and Ondansetron and Propofol infusion  Airway Management Planned: Simple Face Mask  Additional Equipment:   Intra-op Plan:   Post-operative Plan:   Informed Consent: I have reviewed the patients History and Physical, chart, labs and discussed the procedure including the risks, benefits and alternatives for the proposed anesthesia with the patient or authorized representative who has indicated his/her understanding and acceptance.   Dental advisory given  Plan Discussed with:   Anesthesia Plan Comments:         Anesthesia Quick Evaluation

## 2017-08-16 NOTE — Brief Op Note (Signed)
08/16/2017  3:07 PM  PATIENT:  Dana Nelson  72 y.o. female  PRE-OPERATIVE DIAGNOSIS:  endstage osteoarthritis left hip  POST-OPERATIVE DIAGNOSIS:  endstage osteoarthritis left hip  PROCEDURE:  Procedure(s): LEFT TOTAL HIP ARTHROPLASTY ANTERIOR APPROACH (Left)  SURGEON:  Surgeon(s) and Role:    Kathryne Hitch* Ysenia Filice Y, MD - Primary  PHYSICIAN ASSISTANT: Rexene EdisonGil Clark, PA-C  ANESTHESIA:   general  EBL:  400 mL   COUNTS:  YES  DICTATION: .Other Dictation: Dictation Number [id number]  PLAN OF CARE: Admit to inpatient   PATIENT DISPOSITION:  PACU - hemodynamically stable.   Delay start of Pharmacological VTE agent (>24hrs) due to surgical blood loss or risk of bleeding: no

## 2017-08-16 NOTE — Anesthesia Postprocedure Evaluation (Signed)
Anesthesia Post Note  Patient: Dana EpleyRegina Nelson  Procedure(s) Performed: LEFT TOTAL HIP ARTHROPLASTY ANTERIOR APPROACH (Left Hip)     Patient location during evaluation: PACU Anesthesia Type: Spinal Level of consciousness: awake and alert Pain management: pain level controlled Vital Signs Assessment: post-procedure vital signs reviewed and stable Respiratory status: spontaneous breathing and respiratory function stable Cardiovascular status: blood pressure returned to baseline and stable Postop Assessment: no headache, no backache, spinal receding and no apparent nausea or vomiting Anesthetic complications: no    Last Vitals:  Vitals:   08/16/17 1630 08/16/17 1645  BP: (!) 113/57   Pulse: 62 68  Resp: 15 (!) 21  Temp: (!) 36.4 C (!) 36.4 C  SpO2: 100% 100%    Last Pain:  Vitals:   08/16/17 1645  TempSrc:   PainSc: 0-No pain    LLE Motor Response: Purposeful movement (08/16/17 1645) LLE Sensation: Decreased (08/16/17 1645) RLE Motor Response: Purposeful movement (08/16/17 1645) RLE Sensation: Decreased (08/16/17 1645) L Sensory Level: L5-Outer lower leg, top of foot, great toe (08/16/17 1645) R Sensory Level: L5-Outer lower leg, top of foot, great toe (08/16/17 1645)  Phillips Groutarignan, Naeemah Jasmer

## 2017-08-16 NOTE — Progress Notes (Signed)
3 E notified pt will be in 1320 in 20 minutes.

## 2017-08-16 NOTE — Anesthesia Procedure Notes (Signed)
Spinal  Patient location during procedure: OR Staffing Anesthesiologist: Montez Hageman, MD Performed: anesthesiologist  Preanesthetic Checklist Completed: patient identified, site marked, surgical consent, pre-op evaluation, timeout performed, IV checked, risks and benefits discussed and monitors and equipment checked Spinal Block Patient position: sitting Prep: Betadine Patient monitoring: heart rate, continuous pulse ox and blood pressure Approach: left paramedian Location: L2-3 Injection technique: single-shot Needle Needle type: Spinocan  Needle gauge: 22 G Needle length: 9 cm Additional Notes Expiration date of kit checked and confirmed. Patient tolerated procedure well, without complications.

## 2017-08-17 ENCOUNTER — Encounter (HOSPITAL_COMMUNITY): Payer: Self-pay | Admitting: Orthopaedic Surgery

## 2017-08-17 LAB — BASIC METABOLIC PANEL
Anion gap: 8 (ref 5–15)
BUN: 11 mg/dL (ref 8–23)
CALCIUM: 8.8 mg/dL — AB (ref 8.9–10.3)
CO2: 26 mmol/L (ref 22–32)
CREATININE: 0.6 mg/dL (ref 0.44–1.00)
Chloride: 107 mmol/L (ref 98–111)
GFR calc non Af Amer: >60 mL/min (ref >60)
Glucose, Bld: 115 mg/dL — ABNORMAL HIGH (ref 70–99)
Potassium: 3.9 mmol/L (ref 3.5–5.1)
SODIUM: 141 mmol/L (ref 135–145)

## 2017-08-17 LAB — CBC
HCT: 29 % — ABNORMAL LOW (ref 36.0–46.0)
Hemoglobin: 9.1 g/dL — ABNORMAL LOW (ref 12.0–15.0)
MCH: 23.5 pg — ABNORMAL LOW (ref 26.0–34.0)
MCHC: 31.4 g/dL (ref 30.0–36.0)
MCV: 74.7 fL — ABNORMAL LOW (ref 78.0–100.0)
Platelets: 263 10*3/uL (ref 150–400)
RBC: 3.88 MIL/uL (ref 3.87–5.11)
RDW: 18.4 % — ABNORMAL HIGH (ref 11.5–15.5)
WBC: 5.8 10*3/uL (ref 4.0–10.5)

## 2017-08-17 LAB — GLUCOSE, CAPILLARY
GLUCOSE-CAPILLARY: 117 mg/dL — AB (ref 70–99)
Glucose-Capillary: 107 mg/dL — ABNORMAL HIGH (ref 70–99)
Glucose-Capillary: 99 mg/dL (ref 70–99)
Glucose-Capillary: 145 mg/dL — ABNORMAL HIGH (ref 70–99)

## 2017-08-17 NOTE — Progress Notes (Signed)
Physical Therapy Treatment Patient Details Name: Dana EpleyRegina Nelson MRN: [id number] DOB: 1945-08-07 Today's Date: 08/17/2017    History of Present Illness Pt s/p L direct THA with PMH R THR, HTN, osteorarthritis, and anemia.     PT Comments    Pt cooperative but progressing slowly with mobility 2* c/o increased pain with movement.   Follow Up Recommendations  SNF     Equipment Recommendations  None recommended by PT    Recommendations for Other Services       Precautions / Restrictions Precautions Precautions: Fall Restrictions Weight Bearing Restrictions: No Other Position/Activity Restrictions: WBAT    Mobility  Bed Mobility Overal bed mobility: Needs Assistance Bed Mobility: Sit to Supine     Supine to sit: Mod assist;Max assist;+2 for physical assistance Sit to supine: Mod assist;+2 for physical assistance;+2 for safety/equipment   General bed mobility comments: Increased time with cues for sequence and use of R LE to self assist.  Physical assist to manage LEs and control trunk.  Transfers Overall transfer level: Needs assistance Equipment used: Rolling walker (2 wheeled) Transfers: Sit to/from Stand Sit to Stand: +2 physical assistance;+2 safety/equipment;From elevated surface;Min assist;Mod assist         General transfer comment: cues for LE management and use of UEs to self assist  Ambulation/Gait Ambulation/Gait assistance: +2 physical assistance;+2 safety/equipment;Min assist;Mod assist Gait Distance (Feet): 4 Feet Assistive device: Rolling walker (2 wheeled) Gait Pattern/deviations: Step-to pattern;Decreased step length - right;Decreased step length - left;Shuffle;Decreased stance time - left;Trunk flexed Gait velocity: decr   General Gait Details: cues for sequence, posture, increased heel contact on L and position from [employer]   Stairs             Wheelchair Mobility    Modified Rankin (Stroke Patients Only)       Balance Overall  balance assessment: Needs assistance Sitting-balance support: No upper extremity supported;Feet supported Sitting balance-Leahy Scale: Good     Standing balance support: Bilateral upper extremity supported Standing balance-Leahy Scale: Poor                              Cognition Arousal/Alertness: Awake/alert Behavior During Therapy: WFL for tasks assessed/performed Overall Cognitive Status: Within Functional Limits for tasks assessed                                        Exercises Total Joint Exercises Ankle Circles/Pumps: AROM;Both;15 reps;Supine Quad Sets: AROM;Both;10 reps;Supine Heel Slides: AAROM;Left;20 reps;Supine Hip ABduction/ADduction: AAROM;Left;15 reps;Supine    General Comments        Pertinent Vitals/Pain Pain Assessment: 0-10 Pain Score: 5  Pain Location: L hip/groin Pain Descriptors / Indicators: Aching;Sore Pain Intervention(s): Limited activity within patient's tolerance;Monitored during session;Premedicated before session    Home Living Family/patient expects to be discharged to:: Skilled nursing facility                    Prior Function Level of Independence: Needs assistance  Gait / Transfers Assistance Needed: Uses rollator       PT Goals (current goals can now be found in the care plan section) Acute Rehab PT Goals Patient Stated Goal: Rehab and home PT Goal Formulation: With patient Time For Goal Achievement: 08/24/17 Potential to Achieve Goals: Good Progress towards PT goals: Progressing toward goals    Frequency    7X/week  PT Plan Current plan remains appropriate    Co-evaluation              AM-PAC PT "6 Clicks" Daily Activity  Outcome Measure  Difficulty turning over in bed (including adjusting bedclothes, sheets and blankets)?: Unable Difficulty moving from lying on back to sitting on the side of the bed? : Unable Difficulty sitting down on and standing up from a chair with  arms (e.g., wheelchair, bedside commode, etc,.)?: Unable Help needed moving to and from a bed to chair (including a wheelchair)?: A Lot Help needed walking in hospital room?: A Lot Help needed climbing 3-5 steps with a railing? : Total 6 Click Score: 8    End of Session Equipment Utilized During Treatment: Gait belt Activity Tolerance: Patient tolerated treatment well Patient left: in bed;with call bell/phone within reach;with nursing/sitter in room Nurse Communication: Mobility status PT Visit Diagnosis: Difficulty in walking, not elsewhere classified (R26.2)     Time: 8295-6213 PT Time Calculation (min) (ACUTE ONLY): 14 min  Charges:  $Gait Training: 8-22 mins $Therapeutic Exercise: 8-22 mins                    G Codes:       Pg [phone]    Dana Nelson 08/17/2017, 3:16 PM

## 2017-08-17 NOTE — Evaluation (Signed)
Physical Therapy Evaluation Patient Details Name: Dana EpleyRegina Nelson MRN: [id number] DOB: 1946/01/07 Today's Date: 08/17/2017   History of Present Illness  Pt s/p L direct THA with PMH R THR, HTN, osteorarthritis, and anemia.   Clinical Impression  Pt s/p L THR and presents with decreased L LE strength/ROM, post op pain and premorbid deconditioning limiting functional mobility.  Pt would benefit from follow up rehab at SNF level to maximize IND and safety prior to return home with limited assist.    Follow Up Recommendations SNF    Equipment Recommendations  None recommended by PT    Recommendations for Other Services       Precautions / Restrictions Precautions Precautions: Fall Restrictions Weight Bearing Restrictions: No Other Position/Activity Restrictions: WBAT      Mobility  Bed Mobility Overal bed mobility: Needs Assistance Bed Mobility: Supine to Sit     Supine to sit: Mod assist;Max assist;+2 for physical assistance     General bed mobility comments: Increased time with cues for sequence and use of R LE to self assist.  Physical assist to manage L LE, bring trunk to upright and complete transition to EOB sitting  Transfers Overall transfer level: Needs assistance Equipment used: Rolling walker (2 wheeled) Transfers: Sit to/from Stand Sit to Stand: Mod assist;+2 physical assistance;+2 safety/equipment;From elevated surface         General transfer comment: cues for LE management and use of UEs to self assist  Ambulation/Gait Ambulation/Gait assistance: Mod assist;+2 physical assistance;+2 safety/equipment Gait Distance (Feet): 3 Feet Assistive device: Rolling walker (2 wheeled) Gait Pattern/deviations: Step-to pattern;Decreased step length - right;Decreased step length - left;Shuffle;Decreased stance time - left;Trunk flexed Gait velocity: decr   General Gait Details: cues for sequence, posture and position from [employer]  Stairs            Wheelchair  Mobility    Modified Rankin (Stroke Patients Only)       Balance Overall balance assessment: Needs assistance Sitting-balance support: No upper extremity supported;Feet supported Sitting balance-Leahy Scale: Good     Standing balance support: Bilateral upper extremity supported Standing balance-Leahy Scale: Poor                               Pertinent Vitals/Pain Pain Assessment: 0-10 Pain Score: 5 (L hip with attempts to mobilize - no pain at rest) Pain Location: L hip/groin Pain Descriptors / Indicators: Aching;Sore Pain Intervention(s): Limited activity within patient's tolerance;Monitored during session;Premedicated before session;Ice applied    Home Living Family/patient expects to be discharged to:: Skilled nursing facility                      Prior Function Level of Independence: Needs assistance   Gait / Transfers Assistance Needed: Uses rollator           Hand Dominance   Dominant Hand: Left    Extremity/Trunk Assessment   Upper Extremity Assessment Upper Extremity Assessment: Generalized weakness    Lower Extremity Assessment Lower Extremity Assessment: Generalized weakness;LLE deficits/detail LLE Deficits / Details: 2/5 strength at hip with AAROM at hip to 75 flex and 15 abd       Communication   Communication: No difficulties  Cognition Arousal/Alertness: Awake/alert Behavior During Therapy: WFL for tasks assessed/performed Overall Cognitive Status: Within Functional Limits for tasks assessed  General Comments      Exercises Total Joint Exercises Ankle Circles/Pumps: AROM;Both;15 reps;Supine Quad Sets: AROM;Both;10 reps;Supine Heel Slides: AAROM;Left;20 reps;Supine Hip ABduction/ADduction: AAROM;Left;15 reps;Supine   Assessment/Plan    PT Assessment Patient needs continued PT services  PT Problem List Decreased strength;Decreased range of motion;Decreased  activity tolerance;Decreased balance;Decreased mobility;Decreased knowledge of use of DME;Pain;Obesity       PT Treatment Interventions DME instruction;Gait training;Stair training;Functional mobility training;Therapeutic activities;Therapeutic exercise;Patient/family education    PT Goals (Current goals can be found in the Care Plan section)  Acute Rehab PT Goals Patient Stated Goal: Rehab and home PT Goal Formulation: With patient Time For Goal Achievement: 08/24/17 Potential to Achieve Goals: Good    Frequency 7X/week   Barriers to discharge        Co-evaluation               AM-PAC PT "6 Clicks" Daily Activity  Outcome Measure Difficulty turning over in bed (including adjusting bedclothes, sheets and blankets)?: Unable Difficulty moving from lying on back to sitting on the side of the bed? : Unable Difficulty sitting down on and standing up from a chair with arms (e.g., wheelchair, bedside commode, etc,.)?: Unable Help needed moving to and from a bed to chair (including a wheelchair)?: A Lot Help needed walking in hospital room?: A Lot Help needed climbing 3-5 steps with a railing? : Total 6 Click Score: 8    End of Session Equipment Utilized During Treatment: Gait belt Activity Tolerance: Patient tolerated treatment well Patient left: in chair;with call bell/phone within reach;with chair alarm set;with nursing/sitter in room Nurse Communication: Mobility status PT Visit Diagnosis: Difficulty in walking, not elsewhere classified (R26.2)    Time: 1610-9604 PT Time Calculation (min) (ACUTE ONLY): 30 min   Charges:   PT Evaluation $PT Eval Low Complexity: 1 Low PT Treatments $Therapeutic Exercise: 8-22 mins   PT G Codes:        Pg [phone]   Arshawn Valdez 08/17/2017, 12:58 PM

## 2017-08-17 NOTE — Progress Notes (Signed)
Subjective: 1 Day Post-Op Procedure(s) (LRB): LEFT TOTAL HIP ARTHROPLASTY ANTERIOR APPROACH (Left) Patient reports pain as moderate.  Acute blood loss anemia, but tolerating well.  Objective: Vital signs in last 24 hours: Temp:  [97.3 F (36.3 C)-98.7 F (37.1 C)] 98.4 F (36.9 C) (07/10 0545) Pulse Rate:  [25-84] 84 (07/10 0545) Resp:  [15-21] 18 (07/10 0545) BP: (90-170)/(48-93) 158/77 (07/10 0545) SpO2:  [97 %-100 %] 100 % (07/10 0545) Weight:  [167 lb (75.8 kg)] 167 lb (75.8 kg) (07/09 1245)  Intake/Output from previous day: 07/09 0701 - 07/10 0700 In: 2987.5 [P.O.:600; I.V.:2187.5; IV Piggyback:100] Out: 1200 [Urine:800; Blood:400] Intake/Output this shift: Total I/O In: 1010 [P.O.:360; I.V.:600; IV Piggyback:50] Out: 800 [Urine:800]  Recent Labs    08/17/17 0557  HGB 9.1*   Recent Labs    08/17/17 0557  WBC 5.8  RBC 3.88  HCT 29.0*  PLT 263   Recent Labs    08/17/17 0557  NA 141  K 3.9  CL 107  CO2 26  BUN 11  CREATININE 0.60  GLUCOSE 115*  CALCIUM 8.8*   No results for input(s): LABPT, INR in the last 72 hours.  Sensation intact distally Intact pulses distally Dorsiflexion/Plantar flexion intact Incision: scant drainage  Assessment/Plan: 1 Day Post-Op Procedure(s) (LRB): LEFT TOTAL HIP ARTHROPLASTY ANTERIOR APPROACH (Left) [facility]    Kathryne HitchChristopher Y Aran Menning 08/17/2017, 6:54 AM

## 2017-08-17 NOTE — NC FL2 (Signed)
[place] MEDICAID FL2 LEVEL OF CARE SCREENING TOOL     IDENTIFICATION  Patient Name: Marybeth Dandy Birthdate: 10-26-1945 Sex: female Admission Date (Current Location): 08/16/2017  [facility] and [place] Number:  Producer, television/film/video and Address:  [facility],  [address] [place], [place] [address]      Provider Number: [id number]  Attending Physician Name and Address:  Kathryne Hitch  Relative Name and Phone Number:       Current Level of Care: Hospital Recommended Level of Care: Skilled Nursing Facility Prior Approval Number:    Date Approved/Denied:   PASRR Number: [id number]  Discharge Plan:      Current Diagnoses: Patient Active Problem List   Diagnosis Date Noted  . Status post total replacement of left hip 08/16/2017  . Status post total replacement of right hip 05/24/2017  . Unilateral primary osteoarthritis, right hip 04/19/2017  . Unilateral primary osteoarthritis, left hip 04/19/2017  . Bilateral primary osteoarthritis of hip 04/08/2017  . Chronic anticoagulation 12/27/2016  . Hypomagnesemia 12/27/2016  . Diabetes mellitus without complication (HCC) 12/18/2016  . Osteoarthritis 12/18/2016  . Right knee pain 12/07/2016  . Right hip pain 12/07/2016  . AVM (arteriovenous malformation) of small bowel, acquired   . Hypokalemia 12/04/2016  . Melena 12/04/2016  . GI bleed 12/04/2016  . Hematochezia   . Acute blood loss as cause of postoperative anemia   . Chronic iron deficiency anemia   . Angioedema 08/17/2016  . Blood loss anemia 08/17/2016  . DVT (deep venous thrombosis) (HCC) 08/17/2016  . HTN (hypertension) 08/17/2016  . Hyperlipidemia 08/17/2016  . Microcytic anemia 08/17/2016  . Hypothyroidism 08/17/2016  . GERD (gastroesophageal reflux disease) 08/17/2016    Orientation RESPIRATION BLADDER Height & Weight     Self, Time, Situation, Place  Normal Continent Weight: 167 lb (75.8 kg) Height:  5\' 3"  (160 cm)  BEHAVIORAL  SYMPTOMS/MOOD NEUROLOGICAL BOWEL NUTRITION STATUS      Continent Diet  AMBULATORY STATUS COMMUNICATION OF NEEDS Skin   Extensive Assist Verbally Surgical wounds(Left Hip)                       Personal Care Assistance Level of Assistance  Bathing, Feeding, Dressing Bathing Assistance: Limited assistance Feeding assistance: Independent Dressing Assistance: Limited assistance     Functional Limitations Info  Sight, Hearing, Speech Sight Info: Adequate Hearing Info: Adequate Speech Info: Adequate    SPECIAL CARE FACTORS FREQUENCY  PT (By licensed PT), OT (By licensed OT)     PT Frequency: 7x/week  OT Frequency: 7x/week             Contractures Contractures Info: Not present    Additional Factors Info  Code Status, Allergies Code Status Info: Fullcode  Allergies Info:  Latex, Eggs Or Egg-derived Products, Losartan, Other, Peanut-containing Drug Products, Penicillins           Current Medications (08/17/2017):  This is the current hospital active medication list Current Facility-Administered Medications  Medication Dose Route Frequency Provider Last Rate Last Dose  . 0.9 %  sodium chloride infusion   Intravenous Continuous Kathryne Hitch, MD 75 mL/hr at 08/16/17 1743    . acetaminophen (TYLENOL) tablet 325-650 mg  325-650 mg Oral Q6H PRN Kathryne Hitch, MD      . albuterol (PROVENTIL) (2.5 MG/3ML) 0.083% nebulizer solution 3 mL  3 mL Inhalation Q6H PRN Kathryne Hitch, MD      . allopurinol (ZYLOPRIM) tablet 100 mg  100  mg Oral Daily Kathryne Hitch, MD   100 mg at 08/17/17 0846  . alum & mag hydroxide-simeth (MAALOX/MYLANTA) 200-200-20 MG/5ML suspension 30 mL  30 mL Oral Q4H PRN Kathryne Hitch, MD      . amLODipine (NORVASC) tablet 5 mg  5 mg Oral Daily Kathryne Hitch, MD   5 mg at 08/17/17 0845  . aspirin chewable tablet 81 mg  81 mg Oral BID Kathryne Hitch, MD   81 mg at 08/17/17 0845  . diphenhydrAMINE  (BENADRYL) 12.5 MG/5ML elixir 12.5-25 mg  12.5-25 mg Oral Q4H PRN Kathryne Hitch, MD      . docusate sodium (COLACE) capsule 100 mg  100 mg Oral BID Kathryne Hitch, MD   100 mg at 08/17/17 0846  . famotidine (PEPCID) tablet 40 mg  40 mg Oral Daily Kathryne Hitch, MD   40 mg at 08/17/17 0846  . ferrous sulfate tablet 325 mg  325 mg Oral Daily Kathryne Hitch, MD   325 mg at 08/17/17 0845  . hydrALAZINE (APRESOLINE) tablet 25 mg  25 mg Oral BID Kathryne Hitch, MD   25 mg at 08/17/17 0846  . HYDROmorphone (DILAUDID) injection 0.5-1 mg  0.5-1 mg Intravenous Q4H PRN Kathryne Hitch, MD      . levothyroxine (SYNTHROID, LEVOTHROID) tablet 112 mcg  112 mcg Oral QAC lunch Kathryne Hitch, MD      . menthol-cetylpyridinium (CEPACOL) lozenge 3 mg  1 lozenge Oral PRN Kathryne Hitch, MD       Or  . phenol (CHLORASEPTIC) mouth spray 1 spray  1 spray Mouth/Throat PRN Kathryne Hitch, MD      . metFORMIN (GLUCOPHAGE-XR) 24 hr tablet 750 mg  750 mg Oral BID WC Kathryne Hitch, MD   750 mg at 08/16/17 2142  . methocarbamol (ROBAXIN) tablet 500 mg  500 mg Oral Q6H PRN Kathryne Hitch, MD   500 mg at 08/17/17 0549   Or  . methocarbamol (ROBAXIN) 500 mg in dextrose 5 % 50 mL IVPB  500 mg Intravenous Q6H PRN Kathryne Hitch, MD      . metoCLOPramide (REGLAN) tablet 5-10 mg  5-10 mg Oral Q8H PRN Kathryne Hitch, MD       Or  . metoCLOPramide (REGLAN) injection 5-10 mg  5-10 mg Intravenous Q8H PRN Kathryne Hitch, MD      . metoprolol succinate (TOPROL-XL) 24 hr tablet 50 mg  50 mg Oral Daily Kathryne Hitch, MD   50 mg at 08/17/17 0846  . ondansetron (ZOFRAN) tablet 4 mg  4 mg Oral Q6H PRN Kathryne Hitch, MD       Or  . ondansetron [facility]) injection 4 mg  4 mg Intravenous Q6H PRN Kathryne Hitch, MD   4 mg at 08/17/17 0313  . oxyCODONE (Oxy IR/ROXICODONE) immediate release  tablet 10-15 mg  10-15 mg Oral Q4H PRN Kathryne Hitch, MD      . oxyCODONE (Oxy IR/ROXICODONE) immediate release tablet 5-10 mg  5-10 mg Oral Q4H PRN Kathryne Hitch, MD   5 mg at 08/17/17 0847  . polyethylene glycol (MIRALAX / GLYCOLAX) packet 17 g  17 g Oral Daily PRN Kathryne Hitch, MD      . pravastatin (PRAVACHOL) tablet 20 mg  20 mg Oral QPM Kathryne Hitch, MD         Discharge Medications: Please see discharge summary for a list of discharge  medications.  Relevant Imaging Results:  Relevant Lab Results:   Additional Information SSN: [id number]  Clearance CootsNicole A Debra Calabretta, LCSW

## 2017-08-17 NOTE — Clinical Social Work Note (Signed)
Clinical Social Work Assessment  Patient Details  Name: Dana Nelson MRN: [id number] Date of Birth: 1945/09/24  Date of referral:  08/17/17               Reason for consult:  Facility Placement                Permission sought to share information with:  Chartered certified accountant granted to share information::  Yes, Verbal Permission Granted  Name::        Agency::  SNF   Relationship::  Sisters  Contact Information:     Housing/Transportation Living arrangements for the past 2 months:  [place] of Information:  Patient Patient Interpreter Needed:  None Criminal Activity/Legal Involvement Pertinent to Current Situation/Hospitalization:  No - Comment as needed Significant Relationships:  Siblings Lives with:  Self Do you feel safe going back to the place where you live?  Yes Need for family participation in patient care:  Yes (Comment)  Care giving concerns:   SNF for short rehab.   Social Worker assessment / plan:  CSW met with patient at bedside, explain role and reason for visit-to assist with discharge to SNF.  Patient reports she is hopeful to return to [facility] for rehab. She was there in April post right hip surgery. Patient reports she is familiar with SNF process and plan is for d/c Friday. CSW sent clinicals via HUB and contacted SNF liaison. SNF has extended a bed offer.  FL2 complete. PASRR done.   Patient will transport by PTAR Plan: SNF   Employment status:  Retired Forensic scientist:  Medicare PT Recommendations:  [place] / Referral to community resources:  [place]  Patient/Family's Response to care:  Agreeable and Responding well to care.   Patient/Family's Understanding of and Emotional Response to Diagnosis, Current Treatment, and Prognosis: Patient has a good understanding diagnosis and follow up care. Patient reports her sister will provide support while at SNF and  post rehab.   Emotional Assessment Appearance:  Appears stated age Attitude/Demeanor/Rapport:    Affect (typically observed):  Accepting Orientation:  Oriented to Self, Oriented to Place, Oriented to  Time, Oriented to Situation Alcohol / Substance use:  Not Applicable Psych involvement (Current and /or in the community):  No (Comment)  Discharge Needs  Concerns to be addressed:  Discharge Planning Concerns Readmission within the last 30 days:  No Current discharge risk:  Dependent with Mobility Barriers to Discharge:  Continued Medical Work up   [employer], LCSW 08/17/2017, 10:35 AM

## 2017-08-17 NOTE — Progress Notes (Signed)
OT Cancellation Note  Patient Details Name: Dana EpleyRegina Nelson MRN: [id number] DOB: [birth year]   Cancelled Treatment:    Reason Eval/Treat Not Completed: Fatigue/lethargy limiting ability to participate  Will check on pt next day.  Dana AuerLori Abhiram Nelson, [place] [phone]  Einar CrowEDDING, Dana Nelson 08/17/2017, 4:19 PM

## 2017-08-18 LAB — GLUCOSE, CAPILLARY
GLUCOSE-CAPILLARY: 110 mg/dL — AB (ref 70–99)
GLUCOSE-CAPILLARY: 101 mg/dL — AB (ref 70–99)
GLUCOSE-CAPILLARY: 109 mg/dL — AB (ref 70–99)
Glucose-Capillary: 117 mg/dL — ABNORMAL HIGH (ref 70–99)

## 2017-08-18 LAB — CBC
HCT: 25.5 % — ABNORMAL LOW (ref 36.0–46.0)
Hemoglobin: 8.1 g/dL — ABNORMAL LOW (ref 12.0–15.0)
MCH: 23.5 pg — ABNORMAL LOW (ref 26.0–34.0)
MCHC: 31.8 g/dL (ref 30.0–36.0)
MCV: 74.1 fL — ABNORMAL LOW (ref 78.0–100.0)
PLATELETS: 266 10*3/uL (ref 150–400)
RBC: 3.44 MIL/uL — ABNORMAL LOW (ref 3.87–5.11)
RDW: 18.4 % — ABNORMAL HIGH (ref 11.5–15.5)
WBC: 11.4 10*3/uL — AB (ref 4.0–10.5)

## 2017-08-18 MED ORDER — OXYCODONE HCL 5 MG PO TABS: 5.0000 mg | ORAL_TABLET | ORAL | 0 refills | Status: DC | PRN

## 2017-08-18 MED ORDER — METHOCARBAMOL 500 MG PO TABS: 500.0000 mg | ORAL_TABLET | Freq: Four times a day (QID) | ORAL | 0 refills | Status: AC | PRN

## 2017-08-18 NOTE — Progress Notes (Signed)
Physical Therapy Treatment Patient Details Name: Dana EpleyRegina Cashen MRN: [id number] DOB: [birth year] Today's Date: 08/18/2017    History of Present Illness Pt s/p L direct THA with PMH R THR, HTN, osteorarthritis, and anemia.     PT Comments    Pt back to bed with nursing but agreeable to therex program.  Pt hopeful for dc to SNF tomorrow.   Follow Up Recommendations  SNF     Equipment Recommendations  None recommended by PT    Recommendations for Other Services OT consult     Precautions / Restrictions Precautions Precautions: Fall Restrictions Weight Bearing Restrictions: No Other Position/Activity Restrictions: WBAT    Mobility  Bed Mobility               General bed mobility comments: Pt up in chair and requests back to same  Transfers Overall transfer level: Needs assistance Equipment used: Rolling walker (2 wheeled) Transfers: Sit to/from Stand Sit to Stand: +2 physical assistance;+2 safety/equipment;From elevated surface;Min assist;Mod assist         General transfer comment: cues for LE management and use of UEs to self assist  Ambulation/Gait Ambulation/Gait assistance: +2 safety/equipment;Min assist;Mod assist Gait Distance (Feet): 19 Feet Assistive device: Rolling walker (2 wheeled) Gait Pattern/deviations: Step-to pattern;Decreased step length - right;Decreased step length - left;Shuffle;Decreased stance time - left;Trunk flexed Gait velocity: decr   General Gait Details: cues for sequence, posture, increased heel contact on L and position from [employer]   Stairs             Wheelchair Mobility    Modified Rankin (Stroke Patients Only)       Balance Overall balance assessment: Needs assistance Sitting-balance support: No upper extremity supported;Feet supported Sitting balance-Leahy Scale: Good     Standing balance support: Bilateral upper extremity supported Standing balance-Leahy Scale: Poor                              Cognition Arousal/Alertness: Awake/alert Behavior During Therapy: WFL for tasks assessed/performed Overall Cognitive Status: Within Functional Limits for tasks assessed                                        Exercises Total Joint Exercises Ankle Circles/Pumps: AROM;Both;15 reps;Supine Quad Sets: AROM;Both;10 reps;Supine Heel Slides: AAROM;Left;20 reps;Supine Hip ABduction/ADduction: AAROM;Left;15 reps;Supine    General Comments        Pertinent Vitals/Pain Pain Assessment: 0-10 Pain Score: 5  Pain Location: L hip/groin Pain Descriptors / Indicators: Aching;Sore Pain Intervention(s): Limited activity within patient's tolerance;Monitored during session;Premedicated before session;Ice applied    Home Living                      Prior Function            PT Goals (current goals can now be found in the care plan section) Acute Rehab PT Goals Patient Stated Goal: Rehab and home PT Goal Formulation: With patient Time For Goal Achievement: 08/24/17 Potential to Achieve Goals: Good Progress towards PT goals: Progressing toward goals    Frequency    7X/week      PT Plan Current plan remains appropriate    Co-evaluation PT/OT/SLP Co-Evaluation/Treatment: Yes Reason for Co-Treatment: For patient/therapist safety PT goals addressed during session: Mobility/safety with mobility OT goals addressed during session: ADL's and self-care      AM-PAC  PT "6 Clicks" Daily Activity  Outcome Measure  Difficulty turning over in bed (including adjusting bedclothes, sheets and blankets)?: Unable Difficulty moving from lying on back to sitting on the side of the bed? : Unable Difficulty sitting down on and standing up from a chair with arms (e.g., wheelchair, bedside commode, etc,.)?: Unable Help needed moving to and from a bed to chair (including a wheelchair)?: A Lot Help needed walking in hospital room?: A Lot Help needed climbing 3-5 steps with a  railing? : Total 6 Click Score: 8    End of Session Equipment Utilized During Treatment: Gait belt Activity Tolerance: Patient tolerated treatment well Patient left: in bed;with call bell/phone within reach Nurse Communication: Mobility status PT Visit Diagnosis: Difficulty in walking, not elsewhere classified (R26.2)     Time: 1610-9604 PT Time Calculation (min) (ACUTE ONLY): 19 min  Charges:  $Gait Training: 8-22 mins $Therapeutic Exercise: 8-22 mins                    G Codes:       Pg [phone]    Aldair Rickel 08/18/2017, 3:54 PM

## 2017-08-18 NOTE — Progress Notes (Signed)
Physical Therapy Treatment Patient Details Name: Dana EpleyRegina Ryder MRN: [id number] DOB: [birth year] Today's Date: 08/18/2017    History of Present Illness Pt s/p L direct THA with PMH R THR, HTN, osteorarthritis, and anemia.     PT Comments    Pt continues cooperative and with increased time tolerating marked increase in activity.  Follow Up Recommendations  SNF     Equipment Recommendations  None recommended by PT    Recommendations for Other Services OT consult     Precautions / Restrictions Precautions Precautions: Fall Restrictions Weight Bearing Restrictions: No Other Position/Activity Restrictions: WBAT    Mobility  Bed Mobility Overal bed mobility: Needs Assistance Bed Mobility: Sit to Supine       Sit to supine: Mod assist;+2 for physical assistance;+2 for safety/equipment   General bed mobility comments: Pt up in chair and requests back to same  Transfers Overall transfer level: Needs assistance Equipment used: Rolling walker (2 wheeled) Transfers: Sit to/from Stand Sit to Stand: +2 physical assistance;+2 safety/equipment;From elevated surface;Min assist;Mod assist         General transfer comment: cues for LE management and use of UEs to self assist  Ambulation/Gait Ambulation/Gait assistance: +2 safety/equipment;Min assist;Mod assist Gait Distance (Feet): 19 Feet Assistive device: Rolling walker (2 wheeled) Gait Pattern/deviations: Step-to pattern;Decreased step length - right;Decreased step length - left;Shuffle;Decreased stance time - left;Trunk flexed Gait velocity: decr   General Gait Details: cues for sequence, posture, increased heel contact on L and position from [employer]   Stairs             Wheelchair Mobility    Modified Rankin (Stroke Patients Only)       Balance Overall balance assessment: Needs assistance Sitting-balance support: No upper extremity supported;Feet supported Sitting balance-Leahy Scale: Good     Standing  balance support: Bilateral upper extremity supported Standing balance-Leahy Scale: Poor                              Cognition Arousal/Alertness: Awake/alert Behavior During Therapy: WFL for tasks assessed/performed Overall Cognitive Status: Within Functional Limits for tasks assessed                                        Exercises      General Comments        Pertinent Vitals/Pain Pain Assessment: 0-10 Pain Score: 5  Pain Location: L hip/groin Pain Descriptors / Indicators: Aching;Sore Pain Intervention(s): Limited activity within patient's tolerance;Monitored during session;Premedicated before session;Ice applied    Home Living                      Prior Function            PT Goals (current goals can now be found in the care plan section) Acute Rehab PT Goals Patient Stated Goal: Rehab and home PT Goal Formulation: With patient Time For Goal Achievement: 08/24/17 Potential to Achieve Goals: Good Progress towards PT goals: Progressing toward goals    Frequency    7X/week      PT Plan Current plan remains appropriate    Co-evaluation PT/OT/SLP Co-Evaluation/Treatment: Yes Reason for Co-Treatment: For patient/therapist safety PT goals addressed during session: Mobility/safety with mobility OT goals addressed during session: ADL's and self-care      AM-PAC PT "6 Clicks" Daily Activity  Outcome Measure  Difficulty  turning over in bed (including adjusting bedclothes, sheets and blankets)?: Unable Difficulty moving from lying on back to sitting on the side of the bed? : Unable Difficulty sitting down on and standing up from a chair with arms (e.g., wheelchair, bedside commode, etc,.)?: Unable Help needed moving to and from a bed to chair (including a wheelchair)?: A Lot Help needed walking in hospital room?: A Lot Help needed climbing 3-5 steps with a railing? : Total 6 Click Score: 8    End of Session Equipment  Utilized During Treatment: Gait belt Activity Tolerance: Patient tolerated treatment well Patient left: in chair;with call bell/phone within reach;with chair alarm set Nurse Communication: Mobility status PT Visit Diagnosis: Difficulty in walking, not elsewhere classified (R26.2)     Time: 1610-9604 PT Time Calculation (min) (ACUTE ONLY): 25 min  Charges:  $Gait Training: 8-22 mins                    G Codes:       Pg [phone]    Myson Levi 08/18/2017, 12:11 PM

## 2017-08-18 NOTE — Discharge Instructions (Signed)

## 2017-08-18 NOTE — Evaluation (Signed)
Occupational Therapy Evaluation Patient Details Name: Dana EpleyRegina Nelson MRN: [id number] DOB: September 26, 1945 Today's Date: 08/18/2017    History of Present Illness Pt s/p L direct THA with PMH R THR, HTN, osteorarthritis, and anemia.    Clinical Impression  Pt is s/p THA resulting in the deficits listed below (see OT Problem List).  Pt will benefit from skilled OT to increase their safety and independence with ADL and functional mobility for ADL to facilitate discharge to venue listed below.        Follow Up Recommendations  SNF    Equipment Recommendations  None recommended by OT    Recommendations for Other Services       Precautions / Restrictions Precautions Precautions: Fall Restrictions Weight Bearing Restrictions: No Other Position/Activity Restrictions: WBAT      Mobility Bed Mobility Overal bed mobility: Needs Assistance Bed Mobility: Sit to Supine       Sit to supine: Mod assist;+2 for physical assistance;+2 for safety/equipment   General bed mobility comments: pt is in chair  Transfers Overall transfer level: Needs assistance Equipment used: Rolling walker (2 wheeled) Transfers: Sit to/from Stand Sit to Stand: +2 physical assistance;+2 safety/equipment;From elevated surface;Min assist;Mod assist         General transfer comment: cues for LE management and use of UEs to self assist    Balance Overall balance assessment: Needs assistance Sitting-balance support: No upper extremity supported;Feet supported Sitting balance-Leahy Scale: Good     Standing balance support: Bilateral upper extremity supported Standing balance-Leahy Scale: Poor                             ADL either performed or assessed with clinical judgement   ADL Overall ADL's : Needs assistance/impaired Eating/Feeding: Sitting   Grooming: Set up;Sitting   Upper Body Bathing: Minimal assistance;Sitting   Lower Body Bathing: Maximal assistance;Sit to/from stand;+2 for  physical assistance;+2 for safety/equipment;Sitting/lateral leans   Upper Body Dressing : Minimal assistance;Sitting   Lower Body Dressing: Maximal assistance;Sit to/from stand;Cueing for safety   Toilet Transfer: Moderate assistance;+2 for physical assistance;+2 for safety/equipment;Stand-pivot;BSC;RW   Toileting- Clothing Manipulation and Hygiene: Maximal assistance;Sit to/from stand;+2 for safety/equipment;+2 for physical assistance               Vision Patient Visual Report: No change from baseline        Cognition Arousal/Alertness: Awake/alert Behavior During Therapy: WFL for tasks assessed/performed Overall Cognitive Status: Within Functional Limits for tasks assessed                                                        OT Goals(Current goals can be found in the care plan section) Acute Rehab OT Goals Patient Stated Goal: Rehab and home ADL Goals Pt Will Perform Lower Body Dressing: (P) with modified independence;sit to/from stand Pt Will Transfer to Toilet: (P) with modified independence;ambulating Pt Will Perform Toileting - Clothing Manipulation and hygiene: (P) with modified independence;sit to/from stand  OT Frequency: Min 2X/week    AM-PAC PT "6 Clicks" Daily Activity     Outcome Measure Help from another person eating meals?: A Little Help from another person taking care of personal grooming?: A Little Help from another person toileting, which includes using toliet, bedpan, or urinal?: A Lot Help from another person  bathing (including washing, rinsing, drying)?: A Lot Help from another person to put on and taking off regular upper body clothing?: A Little Help from another person to put on and taking off regular lower body clothing?: A Lot 6 Click Score: 15   End of Session Equipment Utilized During Treatment: Rolling walker Nurse Communication: Mobility status  Activity Tolerance: Patient tolerated treatment well Patient left:  in chair;with call bell/phone within reach;with chair alarm set  OT Visit Diagnosis: Unsteadiness on feet (R26.81);History of falling (Z91.81);Muscle weakness (generalized) (M62.81);Other abnormalities of gait and mobility (R26.89)                Time: 1005-1025 OT Time Calculation (min): 20 min Charges:  OT General Charges $OT Visit: 1 Visit OT Evaluation $OT Eval Moderate Complexity: 1 Mod G-Codes:     Lise Auer, [place] [phone]  Einar Crow D 08/18/2017, 11:09 AM

## 2017-08-18 NOTE — Progress Notes (Signed)
Subjective: 2 Days Post-Op Procedure(s) (LRB): LEFT TOTAL HIP ARTHROPLASTY ANTERIOR APPROACH (Left) Patient reports pain as moderate.  Hgb 8.1, but still asymptomatic. Working well with therapy.  Objective: Vital signs in last 24 hours: Temp:  [98.6 F (37 C)-101.3 F (38.5 C)] 99.3 F (37.4 C) (07/11 0415) Pulse Rate:  [89-107] 107 (07/11 0415) Resp:  [16-20] 20 (07/11 0415) BP: (108-146)/(67-76) 141/73 (07/11 0415) SpO2:  [95 %-100 %] 100 % (07/11 0415)  Intake/Output from previous day: 07/10 0701 - 07/11 0700 In: 2055 [P.O.:480; I.V.:1575] Out: 1250 [Urine:1250] Intake/Output this shift: No intake/output data recorded.  Recent Labs    08/17/17 0557 08/18/17 0548  HGB 9.1* 8.1*   Recent Labs    08/17/17 0557 08/18/17 0548  WBC 5.8 11.4*  RBC 3.88 3.44*  HCT 29.0* 25.5*  PLT 263 266   Recent Labs    08/17/17 0557  NA 141  K 3.9  CL 107  CO2 26  BUN 11  CREATININE 0.60  GLUCOSE 115*  CALCIUM 8.8*   No results for input(s): LABPT, INR in the last 72 hours.  Sensation intact distally Intact pulses distally Dorsiflexion/Plantar flexion intact Incision: scant drainage    Assessment/Plan: 2 Days Post-Op Procedure(s) (LRB): LEFT TOTAL HIP ARTHROPLASTY ANTERIOR APPROACH (Left) Up with therapy Discharge to SNF tomorrow. Check CBC in am.    Kathryne HitchChristopher Y Blackman 08/18/2017, 9:41 AM

## 2017-08-19 LAB — CBC
HCT: 24.5 % — ABNORMAL LOW (ref 36.0–46.0)
Hemoglobin: 7.9 g/dL — ABNORMAL LOW (ref 12.0–15.0)
MCH: 23.9 pg — AB (ref 26.0–34.0)
MCHC: 32.2 g/dL (ref 30.0–36.0)
MCV: 74.2 fL — AB (ref 78.0–100.0)
PLATELETS: 269 10*3/uL (ref 150–400)
RBC: 3.3 MIL/uL — ABNORMAL LOW (ref 3.87–5.11)
RDW: 18.4 % — ABNORMAL HIGH (ref 11.5–15.5)
WBC: 8.7 10*3/uL (ref 4.0–10.5)

## 2017-08-19 LAB — GLUCOSE, CAPILLARY: Glucose-Capillary: 100 mg/dL — ABNORMAL HIGH (ref 70–99)

## 2017-08-19 NOTE — Progress Notes (Signed)
Patient ID: Dana EpleyRegina Nelson, female   DOB: 1945/10/30, 72 y.o.   MRN: [id number] No acute changes.  Feels great.  Vitals stable.  Left hip stable.  Hgb 7.9, but tolerating very well.  Can go to skilled nursing today.

## 2017-08-19 NOTE — Progress Notes (Signed)
RN attempted to call report to [facility]. RN was transferred around 4 separate times, and waited on hold. Will attempt to call report later in the day.   Paperwork sent with PTAR and patient given a copy of AVS. All patient questions answered.   PTAR informed of last pain medication, BP meds given, and most recent vital signs.

## 2017-08-19 NOTE — Discharge Summary (Signed)
Patient ID: Dana Nelson MRN: [id number] DOB/AGE: 70-Jun-1947 38 y.o.  Admit date: 08/16/2017 Discharge date: 08/19/2017  Admission Diagnoses:  Principal Problem:   Unilateral primary osteoarthritis, left hip Active Problems:   Status post total replacement of left hip   Discharge Diagnoses:  Same  Past Medical History:  Diagnosis Date  . Acute blood loss anemia   . Angioedema 2018  . Asthma    not current  . AVM (arteriovenous malformation) of small bowel, acquired    Jejunum - capsule endo 09/2016  . DVT (deep venous thrombosis) (HCC) 06/2016  . Dyspnea    with exertion  . Eczema   . Eczema   . GERD (gastroesophageal reflux disease) 08/17/2016  . GI bleed 12/04/2016  . Gout   . History of blood transfusion 11/2016   GI bleed  . Hyperlipemia   . Hypertension   . Hypothyroidism   . Iron deficiency anemia   . Lip swelling 08/2016  . Melena 12/04/2016  . Open-angle glaucoma   . Osteoarthritis   . Pre-diabetes    05/13/17- "Dr said she will probably stop it"  . Right hip pain 12/07/2016  . Right knee pain 12/07/2016  . Vitamin D deficiency     Surgeries: Procedure(s): LEFT TOTAL HIP ARTHROPLASTY ANTERIOR APPROACH on 08/16/2017   Consultants:   Discharged Condition: Improved  Hospital Course: Dana Nelson is an 72 y.o. female who was admitted 08/16/2017 for operative treatment ofUnilateral primary osteoarthritis, left hip. Patient has severe unremitting pain that affects sleep, daily activities, and work/hobbies. After pre-op clearance the patient was taken to the operating room on 08/16/2017 and underwent  Procedure(s): LEFT TOTAL HIP ARTHROPLASTY ANTERIOR APPROACH.    Patient was given perioperative antibiotics:  Anti-infectives (From admission, onward)   Start     Dose/Rate Route Frequency Ordered Stop   08/16/17 2000  clindamycin (CLEOCIN) IVPB 600 mg     600 mg 100 mL/hr over 30 Minutes Intravenous Every 6 hours 08/16/17 1710 08/17/17 0300   08/16/17 1215   clindamycin (CLEOCIN) IVPB 900 mg     900 mg 100 mL/hr over 30 Minutes Intravenous On call to O.R. 08/16/17 1201 08/16/17 1356       Patient was given sequential compression devices, early ambulation, and chemoprophylaxis to prevent DVT.  Patient benefited maximally from hospital stay and there were no complications.    Recent vital signs:  Patient Vitals for the past 24 hrs:  BP Temp Temp src Pulse Resp SpO2  08/19/17 0516 137/82 98 F (36.7 C) Oral 87 18 98 %  08/18/17 2042 138/81 98.3 F (36.8 C) Oral 86 16 98 %  08/18/17 1432 (!) 151/72 97.6 F (36.4 C) Oral 83 20 98 %  08/18/17 1315 126/70 (!) 97.5 F (36.4 C) Oral 85 14 99 %  08/18/17 1036 133/69 97.9 F (36.6 C) Oral 88 18 97 %  08/18/17 1034 133/69 - - 88 - -     Recent laboratory studies:  Recent Labs    08/17/17 0557 08/18/17 0548 08/19/17 0524  WBC 5.8 11.4* 8.7  HGB 9.1* 8.1* 7.9*  HCT 29.0* 25.5* 24.5*  PLT 263 266 269  NA 141  --   --   K 3.9  --   --   CL 107  --   --   CO2 26  --   --   BUN 11  --   --   CREATININE 0.60  --   --   GLUCOSE 115*  --   --  CALCIUM 8.8*  --   --      Discharge Medications:   Allergies as of 08/19/2017      Reactions   Latex Swelling   Eggs Or Egg-derived Products Other (See Comments)   Was told not to eat eggs because she had eczema as a child   Losartan Swelling   Swelling of lips and face   Other Other (See Comments)   Hives and facial swelling from all nuts (tree nuts and peanuts)   Peanut-containing Drug Products Hives, Swelling   Facial swelling   Penicillins Other (See Comments)   Childhood allergy- convulstions Has patient had a PCN reaction causing immediate rash, facial/tongue/throat swelling, SOB or lightheadedness with hypotension: Yes Has patient had a PCN reaction causing severe rash involving mucus membranes or skin necrosis: No Has patient had a PCN reaction that required hospitalization: Yes Has patient had a PCN reaction occurring within the  last 10 years: No If all of the above answers are "NO", then may proceed with Cephalosporin use.      Medication List    TAKE these medications   allopurinol 100 MG tablet Commonly known as:  ZYLOPRIM Take 100 mg daily by mouth.   amLODipine 5 MG tablet Commonly known as:  NORVASC Take 5 mg by mouth daily.   aspirin 81 MG chewable tablet Commonly known as:  ASPIRIN CHILDRENS Chew 1 tablet (81 mg total) by mouth 2 (two) times daily after a meal.   famotidine 40 MG tablet Commonly known as:  PEPCID Take 1 tablet (40 mg total) by mouth daily. What changed:    when to take this  reasons to take this   ferrous sulfate 325 (65 FE) MG tablet Take 1 tablet (325 mg total) by mouth daily.   hydrALAZINE 25 MG tablet Commonly known as:  APRESOLINE Take 25 mg by mouth 2 (two) times daily.   levothyroxine 112 MCG tablet Commonly known as:  SYNTHROID, LEVOTHROID Take 112 mcg by mouth daily before lunch.   metFORMIN 750 MG 24 hr tablet Commonly known as:  GLUCOPHAGE-XR Take 750 mg by mouth 2 (two) times daily.   methocarbamol 500 MG tablet Commonly known as:  ROBAXIN Take 1 tablet (500 mg total) by mouth every 6 (six) hours as needed for muscle spasms.   metoprolol succinate 50 MG 24 hr tablet Commonly known as:  TOPROL-XL Take 50 mg by mouth daily. Take with or immediately following a meal.   oxyCODONE 5 MG immediate release tablet Commonly known as:  Oxy IR/ROXICODONE Take 1-2 tablets (5-10 mg total) by mouth every 4 (four) hours as needed for moderate pain (pain score 4-6).   pravastatin 20 MG tablet Commonly known as:  PRAVACHOL Take 20 mg by mouth every evening.   triamcinolone ointment 0.1 % Commonly known as:  KENALOG Apply 1 application topically 2 (two) times daily.   VENTOLIN HFA 108 (90 Base) MCG/ACT inhaler Generic drug:  albuterol Inhale 2 puffs into the lungs every 6 (six) hours as needed for wheezing.            Durable Medical Equipment  (From  admission, onward)        Start     Ordered   08/16/17 1711  DME 3 n 1  Once     08/16/17 1710   08/16/17 1711  DME Walker rolling  Once    Question:  Patient needs a walker to treat with the following condition  Answer:  Status post total replacement  of left hip   08/16/17 1710      Diagnostic Studies: Dg Pelvis Portable  Result Date: 08/16/2017 CLINICAL DATA:  Left anterior approach hip arthroplasty. EXAM: PORTABLE PELVIS 1-2 VIEWS COMPARISON:  Same day intraoperative fluoroscopic images. FINDINGS: Soft tissue emphysema and overlying skin staples are identified about the left hip where there is an uncemented total press fit left hip arthroplasty. No complicating features or fracture. IMPRESSION: Left total hip arthroplasty without complicating features. These results will be called to the ordering clinician or representative by the Radiologist Assistant, and communication documented in the PACS or zVision Dashboard. Electronically Signed   By: Tollie Ethavid  Kwon M.D.   On: 08/16/2017 16:20   Dg C-arm 1-60 Min-no Report  Result Date: 08/16/2017 Fluoroscopy was utilized by the requesting physician.  No radiographic interpretation.   Dg Hip Operative Unilat W Or W/o Pelvis Left  Result Date: 08/16/2017 CLINICAL DATA:  Left hip replacement EXAM: OPERATIVE left HIP (WITH PELVIS IF PERFORMED) 2 VIEWS TECHNIQUE: Fluoroscopic spot image(s) were submitted for interpretation post-operatively. COMPARISON:  Left hip films 07/12/2017 FINDINGS: Two C-arm spot films show the acetabular and femoral components of the left total hip replacement to be in good position. No complicating features are seen. Portions of the right total hip replacement are also noted. IMPRESSION: Left total hip replacement components in good position. No complicating features. Electronically Signed   By: Dwyane DeePaul  Barry M.D.   On: 08/16/2017 15:19    Disposition: Discharge disposition: 03-Skilled Nursing Facility       Discharge  Instructions    Discharge patient   Complete by:  As directed    Discharge disposition:  03-Skilled Nursing Facility   Discharge patient date:  08/19/2017       Contact information for follow-up providers    Kathryne HitchBlackman, Nelissa Bolduc Y, MD Follow up in 2 week(s).   Specialty:  Orthopedic Surgery Contact information: [address] [place] [address] [phone]            Contact information for after-discharge care    Destination    [facility] .   Service:  Skilled Nursing Contact information: [address] [place] [id number] [phone]                   Signed: Kathryne HitchChristopher Y Rashay Barnette 08/19/2017, 6:58 AM

## 2017-08-19 NOTE — Clinical Social Work Placement (Addendum)
D/C summary. Facility will inform CSW when to send the patient. Nurse informed. Facility is ready to accpet.  Nurse given the number to call report.  PTAR arranged for transport.   CLINICAL SOCIAL WORK PLACEMENT  NOTE  Date:  08/19/2017  Patient Details  Name: Dana EpleyRegina Nelson MRN: [id number] Date of Birth: 1945-11-02  Clinical Social Work is seeking post-discharge placement for this patient at the Skilled  Nursing Facility level of care (*CSW will initial, date and re-position this form in  chart as items are completed):  Yes   Patient/family provided with [facility] Clinical Social Work Department's list of facilities offering this level of care within the geographic area requested by the patient (or if unable, by the patient's family).  Yes   Patient/family informed of their freedom to choose among providers that offer the needed level of care, that participate in Medicare, Medicaid or managed care program needed by the patient, have an available bed and are willing to accept the patient.  Yes   Patient/family informed of [facility]'s ownership interest in [facility] and [facility], as well as of the fact that they are under no obligation to receive care at these facilities.  PASRR submitted to EDS on       PASRR number received on       Existing PASRR number confirmed on 08/17/17     FL2 transmitted to all facilities in geographic area requested by pt/family on       FL2 transmitted to all facilities within larger geographic area on 08/19/17     Patient informed that his/her managed care company has contracts with or will negotiate with certain facilities, including the following:  [facility]     Yes   Patient/family informed of bed offers received.  Patient chooses bed at [facility]     Physician recommends and patient chooses bed at      Patient to be transferred to [facility] on 08/19/17.  Patient to be  transferred to facility by PTAR      Patient family notified on 08/19/17 of transfer.  Name of family member notified:  Patient to inform sister      PHYSICIAN       Additional Comment:    _______________________________________________ Clearance CootsNicole A Stellarose Cerny, LCSW 08/19/2017, 9:31 AM

## 2017-08-19 NOTE — Progress Notes (Signed)
Physical Therapy Treatment Patient Details Name: Dana Nelson MRN: [id number] DOB: [birth year] Today's Date: 08/19/2017    History of Present Illness Pt s/p L direct THA with PMH R THR, HTN, osteorarthritis, and anemia.     PT Comments    Pt in good spirits.  Pt had ambulated to/from bathroom with nursing but agreeable to therex program and then assist chair to stretcher on arrival of ambulance transport.   Follow Up Recommendations  SNF     Equipment Recommendations  None recommended by PT    Recommendations for Other Services OT consult     Precautions / Restrictions Precautions Precautions: Fall Restrictions Weight Bearing Restrictions: No Other Position/Activity Restrictions: WBAT    Mobility  Bed Mobility               General bed mobility comments: up to chair with nursing  Transfers Overall transfer level: Needs assistance Equipment used: Rolling walker (2 wheeled) Transfers: Sit to/from Stand Sit to Stand: Min assist         General transfer comment: cues for LE management and use of UEs to self assist  Ambulation/Gait Ambulation/Gait assistance: Min assist Gait Distance (Feet): 4 Feet Assistive device: Rolling walker (2 wheeled) Gait Pattern/deviations: Step-to pattern;Decreased step length - right;Decreased step length - left;Shuffle;Decreased stance time - left;Trunk flexed Gait velocity: decr   General Gait Details: cues for sequence, posture, increased heel contact on L and position from [employer] Mobility    Modified Rankin (Stroke Patients Only)       Balance Overall balance assessment: Needs assistance Sitting-balance support: No upper extremity supported;Feet supported Sitting balance-Leahy Scale: Good     Standing balance support: Bilateral upper extremity supported Standing balance-Leahy Scale: Fair                              Cognition Arousal/Alertness:  Awake/alert Behavior During Therapy: WFL for tasks assessed/performed Overall Cognitive Status: Within Functional Limits for tasks assessed                                        Exercises Total Joint Exercises Ankle Circles/Pumps: AROM;Both;15 reps;Supine Quad Sets: AROM;Both;10 reps;Supine Heel Slides: AAROM;Left;20 reps;Supine Hip ABduction/ADduction: AAROM;Left;15 reps;Supine Long Arc Quad: AAROM;10 reps;Supine;Left    General Comments        Pertinent Vitals/Pain Pain Assessment: 0-10 Pain Score: 5  Pain Location: L hip/groin Pain Descriptors / Indicators: Aching;Sore Pain Intervention(s): Limited activity within patient's tolerance;Monitored during session;Premedicated before session    Home Living                      Prior Function            PT Goals (current goals can now be found in the care plan section) Acute Rehab PT Goals Patient Stated Goal: Rehab and home PT Goal Formulation: With patient Time For Goal Achievement: 08/24/17 Potential to Achieve Goals: Good Progress towards PT goals: Progressing toward goals    Frequency    7X/week      PT Plan Current plan remains appropriate    Co-evaluation              AM-PAC PT "6 Clicks" Daily Activity  Outcome Measure  Difficulty turning over in bed (including  adjusting bedclothes, sheets and blankets)?: Unable Difficulty moving from lying on back to sitting on the side of the bed? : Unable Difficulty sitting down on and standing up from a chair with arms (e.g., wheelchair, bedside commode, etc,.)?: Unable Help needed moving to and from a bed to chair (including a wheelchair)?: A Little Help needed walking in hospital room?: A Little Help needed climbing 3-5 steps with a railing? : A Lot 6 Click Score: 11    End of Session Equipment Utilized During Treatment: Gait belt Activity Tolerance: Patient tolerated treatment well Patient left: Other (comment)(ambulance  stretcher) Nurse Communication: Mobility status PT Visit Diagnosis: Difficulty in walking, not elsewhere classified (R26.2)     Time: [id number] PT Time Calculation (min) (ACUTE ONLY): 25 min  Charges:  $Therapeutic Exercise: 8-22 mins $Therapeutic Activity: 8-22 mins                    G Codes:       Pg [phone]    Ziyad Dyar 08/19/2017, 12:24 PM

## 2017-08-22 ENCOUNTER — Encounter: Payer: Self-pay | Admitting: Internal Medicine

## 2017-08-22 ENCOUNTER — Non-Acute Institutional Stay (SKILLED_NURSING_FACILITY): Payer: Medicare Other | Admitting: Internal Medicine

## 2017-08-22 DIAGNOSIS — E785 Hyperlipidemia, unspecified: Secondary | ICD-10-CM

## 2017-08-22 DIAGNOSIS — E119 Type 2 diabetes mellitus without complications: Secondary | ICD-10-CM

## 2017-08-22 DIAGNOSIS — I10 Essential (primary) hypertension: Secondary | ICD-10-CM | POA: Diagnosis not present

## 2017-08-22 DIAGNOSIS — Z96642 Presence of left artificial hip joint: Secondary | ICD-10-CM | POA: Diagnosis not present

## 2017-08-22 DIAGNOSIS — M1612 Unilateral primary osteoarthritis, left hip: Secondary | ICD-10-CM

## 2017-08-22 DIAGNOSIS — E034 Atrophy of thyroid (acquired): Secondary | ICD-10-CM

## 2017-08-22 NOTE — Progress Notes (Signed)
:  Location[facility] Room Number: 423D Place of Service:  SNF (31)  Keeton Kassebaum D. Lyn Hollingshead, MD   Patient Care Team: Macy Mis, MD as PCP - General [facility])  Extended Emergency Contact Information Primary Emergency Contact: [place], [place], [place] [address] [place] of [place] Home Phone: [phone] Relation: Relative Secondary Emergency Contact: MAYERS,ARLENE          [place], [place] of [place] Home Phone: [phone] Relation: Sister     Allergies: Latex; Eggs or egg-derived products; Losartan; Other; Peanut-containing drug products; and Penicillins  Chief Complaint  Patient presents with  . New Admit To SNF    Admit to [facility]    HPI: [name] with hypertension, history of DVT, GI bleed, AVMs of small bowel, diabetes mellitus, chronic anticoagulation, who is here for history of pain and functional disability and left hip due to arthritis.  Patient has evidence of subchondral cysts, subchondral sclerosis periarticular osteophytes and joint space narrowing.  Patient was admitted to [facility] from 7/9-12 where she underwent a left total hip arthroplasty.  There were no stated complications.  Chemoprophylaxis is with ASA 81 mg twice daily for 4 weeks.  Patient is admitted to skilled nursing facility for OT/PT.  While at skilled nursing facility patient will be followed for hypertension treated with Norvasc, hydralazine, and metoprolol, diabetes mellitus treated with Glucophage and hypothyroidism treated with replacement.  Past Medical History:  Diagnosis Date  . Acute blood loss anemia   . Angioedema 2018  . Asthma    not current  . AVM (arteriovenous malformation) of small bowel, acquired    Jejunum - capsule endo 09/2016  . Chronic anticoagulation   . Diabetes mellitus without complication (HCC)   . DVT (deep venous thrombosis) (HCC) 06/2016  . Dyspnea    with exertion  .  Eczema   . Eczema   . GERD (gastroesophageal reflux disease) 08/17/2016  . GI bleed 12/04/2016  . Gout   . History of blood transfusion 11/2016   GI bleed  . Hyperlipemia   . Hypertension   . Hypomagnesemia   . Hypothyroidism   . Iron deficiency anemia   . Lip swelling 08/2016  . Melena 12/04/2016  . Open-angle glaucoma   . Osteoarthritis   . Osteoarthritis   . Pre-diabetes    05/13/17- "Dr said she will probably stop it"  . Primary osteoarthritis of right hip   . Right hip pain 12/07/2016  . Right knee pain 12/07/2016  . Status post total replacement of left hip   . Status post total replacement of right hip   . Unilateral primary osteoarthritis, left hip   . Vitamin D deficiency     Past Surgical History:  Procedure Laterality Date  . ABDOMINAL HYSTERECTOMY    . COLONOSCOPY  2018   [place] - one polyp  . ENTEROSCOPY N/A 12/06/2016   Procedure: ENTEROSCOPY;  Surgeon: Sherrilyn Rist, MD;  [facility];  Service: Gastroenterology;  Laterality: N/A;  . ESOPHAGOGASTRODUODENOSCOPY  2018   GA - gastritis  . GIVENS CAPSULE STUDY  2018   GA - jejunal AVM's, gastritis 2h 6 min small bowel passage, AVM first at 1 hr post duodenum  . SMALL BOWEL ENTEROSCOPY  03/21/2017  . THYROIDECTOMY    . TOTAL HIP ARTHROPLASTY Right 05/24/2017   Procedure: RIGHT TOTAL HIP ARTHROPLASTY ANTERIOR APPROACH;  Surgeon: Kathryne HitchBlackman, Christopher Y, MD;  [facility];  Service: Orthopedics;  Laterality: Right;  . TOTAL HIP ARTHROPLASTY Left 08/16/2017   Procedure: LEFT TOTAL HIP ARTHROPLASTY ANTERIOR APPROACH;  Surgeon: Kathryne HitchBlackman, Christopher Y, MD;  Location: [facility] ORS;  Service: Orthopedics;  Laterality: Left;    Allergies as of 08/22/2017      Reactions   Latex Swelling   Eggs Or Egg-derived Products Other (See Comments)   Was told not to eat eggs because she had eczema as a child   Losartan Swelling   Swelling of lips and face   Other Other (See Comments)   Hives and facial swelling from all  nuts (tree nuts and peanuts)   Peanut-containing Drug Products Hives, Swelling   Facial swelling   Penicillins Other (See Comments)   Childhood allergy- convulstions Has patient had a PCN reaction causing immediate rash, facial/tongue/throat swelling, SOB or lightheadedness with hypotension: Yes Has patient had a PCN reaction causing severe rash involving mucus membranes or skin necrosis: No Has patient had a PCN reaction that required hospitalization: Yes Has patient had a PCN reaction occurring within the last 10 years: No If all of the above answers are "NO", then may proceed with Cephalosporin use.      Medication List        Accurate as of 08/22/17 11:18 AM. Always use your most recent med list.          allopurinol 100 MG tablet Commonly known as:  ZYLOPRIM Take 100 mg daily by mouth.   amLODipine 5 MG tablet Commonly known as:  NORVASC Take 5 mg by mouth daily.   aspirin 81 MG chewable tablet Commonly known as:  ASPIRIN CHILDRENS Chew 1 tablet (81 mg total) by mouth 2 (two) times daily after a meal.   famotidine 40 MG tablet Commonly known as:  PEPCID Take 1 tablet (40 mg total) by mouth daily.   ferrous sulfate 325 (65 FE) MG tablet Take 1 tablet (325 mg total) by mouth daily.   hydrALAZINE 25 MG tablet Commonly known as:  APRESOLINE Take 25 mg by mouth 2 (two) times daily.   levothyroxine 112 MCG tablet Commonly known as:  SYNTHROID, LEVOTHROID Take 112 mcg by mouth daily before lunch.   metFORMIN 750 MG 24 hr tablet Commonly known as:  GLUCOPHAGE-XR Take 750 mg by mouth 2 (two) times daily.   methocarbamol 500 MG tablet Commonly known as:  ROBAXIN Take 1 tablet (500 mg total) by mouth every 6 (six) hours as needed for muscle spasms.   metoprolol succinate 50 MG 24 hr tablet Commonly known as:  TOPROL-XL Take 50 mg by mouth daily. Take with or immediately following a meal.   oxyCODONE 5 MG immediate release tablet Commonly known as:  Oxy  IR/ROXICODONE Take 1-2 tablets (5-10 mg total) by mouth every 4 (four) hours as needed for moderate pain (pain score 4-6).   pravastatin 20 MG tablet Commonly known as:  PRAVACHOL Take 20 mg by mouth every evening.   triamcinolone ointment 0.1 % Commonly known as:  KENALOG Apply 1 application topically 2 (two) times daily.   VENTOLIN HFA 108 (90 Base) MCG/ACT inhaler Generic drug:  albuterol Inhale 2 puffs into the lungs every 6 (six) hours as needed for wheezing.       No orders of the defined types were placed in this encounter.   Immunization History  Administered Date(s) Administered  . Pneumococcal Polysaccharide-23 04/05/2012  . Tdap 04/23/2011    Social History  Tobacco Use  . Smoking status: Never Smoker  . Smokeless tobacco: Never Used  Substance Use Topics  . Alcohol use: No    Family history is   Family History  Problem Relation Age of Onset  . Dementia Mother        died at 68  . Hypertension Mother   . Alzheimer's disease Mother   . Heart attack Father        died 34  . Hypertension Father   . Ovarian cancer Sister   . Heart attack Brother   . Asthma Son   . Heart attack Sister       Review of Systems  DATA OBTAINED: from patient GENERAL:  no fevers, fatigue, appetite changes SKIN: No itching, or rash EYES: No eye pain, redness, discharge EARS: No earache, tinnitus, change in hearing NOSE: No congestion, drainage or bleeding  MOUTH/THROAT: No mouth or tooth pain, No sore throat RESPIRATORY: No cough, wheezing, SOB CARDIAC: No chest pain, palpitations, lower extremity edema  GI: No abdominal pain, No N/V/D or constipation, No heartburn or reflux  GU: No dysuria, frequency or urgency, or incontinence  MUSCULOSKELETAL: No unrelieved bone/joint pain NEUROLOGIC: No headache, dizziness or focal weakness PSYCHIATRIC: No c/o anxiety or sadness   Vitals:   08/22/17 1049  BP: 130/78  Pulse: 90  Resp: 18  Temp: (!) 97 F (36.1 C)  SpO2:  97%    SpO2 Readings from Last 1 Encounters:  08/22/17 97%   Body mass index is 29.58 kg/m.     Physical Exam  GENERAL APPEARANCE: Alert, conversant,  No acute distress.  SKIN: No particular heat or swelling to incision site HEAD: Normocephalic, atraumatic  EYES: Conjunctiva/lids clear. Pupils round, reactive. EOMs intact.  EARS: External exam WNL, canals clear. Hearing grossly normal.  NOSE: No deformity or discharge.  MOUTH/THROAT: Lips w/o lesions  RESPIRATORY: Breathing is even, unlabored. Lung sounds are clear   CARDIOVASCULAR: Heart RRR no murmurs, rubs or gallops. No peripheral edema for very minor swelling to the postop leg, left GASTROINTESTINAL: Abdomen is soft, non-tender, not distended w/ normal bowel sounds. GENITOURINARY: Bladder non tender, not distended  MUSCULOSKELETAL: No abnormal joints or musculature NEUROLOGIC:  Cranial nerves 2-12 grossly intact. Moves all extremities  PSYCHIATRIC: Mood and affect appropriate to situation, no behavioral issues  Patient Active Problem List   Diagnosis Date Noted  . Status post total replacement of left hip 08/16/2017  . Status post total replacement of right hip 05/24/2017  . Unilateral primary osteoarthritis, right hip 04/19/2017  . Unilateral primary osteoarthritis, left hip 04/19/2017  . Bilateral primary osteoarthritis of hip 04/08/2017  . Chronic anticoagulation 12/27/2016  . Hypomagnesemia 12/27/2016  . Diabetes mellitus without complication (HCC) 12/18/2016  . Osteoarthritis 12/18/2016  . Right knee pain 12/07/2016  . Right hip pain 12/07/2016  . AVM (arteriovenous malformation) of small bowel, acquired   . Hypokalemia 12/04/2016  . Melena 12/04/2016  . GI bleed 12/04/2016  . Hematochezia   . Acute blood loss as cause of postoperative anemia   . Chronic iron deficiency anemia   . Angioedema 08/17/2016  . Blood loss anemia 08/17/2016  . DVT (deep venous thrombosis) (HCC) 08/17/2016  . HTN (hypertension)  08/17/2016  . Hyperlipidemia 08/17/2016  . Microcytic anemia 08/17/2016  . Hypothyroidism 08/17/2016  . GERD (gastroesophageal reflux disease) 08/17/2016      Labs reviewed: Basic Metabolic Panel:    Component Value Date/Time   NA 141 08/17/2017 0557   NA 141 05/28/2017  K 3.9 08/17/2017 0557   CL 107 08/17/2017 0557   CO2 26 08/17/2017 0557   GLUCOSE 115 (H) 08/17/2017 0557   BUN 11 08/17/2017 0557   BUN 12 05/28/2017   CREATININE 0.60 08/17/2017 0557   CALCIUM 8.8 (L) 08/17/2017 0557   PROT 8.0 02/18/2017 1216   ALBUMIN 3.9 02/18/2017 1216   AST 10 02/18/2017 1216   ALT 10 02/18/2017 1216   ALKPHOS 78 02/18/2017 1216   BILITOT 0.4 02/18/2017 1216   GFRNONAA >60 08/17/2017 0557   GFRAA >60 08/17/2017 0557    Recent Labs    12/06/16 0908 12/07/16 0510 12/08/16 0541  05/25/17 0522 05/28/17 08/08/17 1038 08/17/17 0557  NA 140 139 137   < > 136 141 140 141  K 3.8 3.6 4.1   < > 4.5 4.0 3.8 3.9  CL 104 106 103   < > 104  --  104 107  CO2 27 25 25    < > 22  --  23 26  GLUCOSE 97 106* 102*   < > 114*  --  91 115*  BUN 7 7 9    < > 12 12 12 11   CREATININE 0.73 0.72 0.75   < > 0.59 0.4* 0.59 0.60  CALCIUM 9.1 8.7* 8.9   < > 9.4  --  9.7 8.8*  MG 1.6* 1.5* 1.8  --   --   --   --   --   PHOS 3.3 3.5  --   --   --   --   --   --    < > = values in this interval not displayed.   Liver Function Tests: Recent Labs    12/08/16 0541 12/09/16 0654 02/18/17 1216  AST 15 16 10   ALT 11* 11* 10  ALKPHOS 70 73 78  BILITOT 0.6 1.2 0.4  PROT 6.0* 6.5 8.0  ALBUMIN 2.7* 2.7* 3.9   Recent Labs    12/04/16 1251  LIPASE 16   No results for input(s): AMMONIA in the last 8760 hours. CBC: Recent Labs    12/10/16 0400  02/18/17 1122  05/28/17  08/17/17 0557 08/18/17 0548 08/19/17 0524  WBC 10.4   < > 7.6   < > 9.6   < > 5.8 11.4* 8.7  NEUTROABS 7.3  --  5.1  --  6  --   --   --   --   HGB 9.0*   < > 10.1*   < > 8.8*   < > 9.1* 8.1* 7.9*  HCT 28.8*   < > 33.3*   < >  28*   < > 29.0* 25.5* 24.5*  MCV 83.2  --  71.3*   < >  --    < > 74.7* 74.1* 74.2*  PLT 400   < > 527.0*   < > 342   < > 263 266 269   < > = values in this interval not displayed.   Lipid No results for input(s): CHOL, HDL, LDLCALC, TRIG in the last 8760 hours.  Cardiac Enzymes: No results for input(s): CKTOTAL, CKMB, CKMBINDEX, TROPONINI in the last 8760 hours. BNP: No results for input(s): BNP in the last 8760 hours. No results found for: [facility] Lab Results  Component Value Date   HGBA1C 5.5 08/08/2017   Lab Results  Component Value Date   TSH 1.46 02/18/2017   No results found for: VITAMINB12 No results found for: [facility]OL[facility] Lab Results  Component Value Date  IRON 17 (L) 02/18/2017   TIBC 406 08/17/2016   FERRITIN 26.8 02/18/2017    Imaging and Procedures obtained prior to SNF admission: No results found.   Not all labs, radiology exams or other studies done during hospitalization come through on my EPIC note; however they are reviewed by me.    Assessment and Plan  Left hip osteoarthritis/left hip arthroplasty- apparently no complications, none stated; chemoprophylaxis with ASA 81 mg twice daily SNF -patient is admitted for OT/PT.  ASA 81 mg twice daily for prophylaxis for 1 month; we will follow-up CBC and BMP in 5 days  Hypertension SNF -controlled; continue Norvasc 5 mg daily hydralazine 25 mg twice daily and metoprolol XL 50 mg daily  Diabetes mellitus SNF -not stated as uncontrolled; continue metformin 750 mg twice daily; patient is on statin  Hypothyroidism SNF -not stated as uncontrolled; continue Synthroid 112 mcg daily  Hyperlipidemia SNF -not stated as uncontrolled; continue Pravachol 20 mg daily   Time spent greater than 35 minutes;> 50% of time with patient was spent reviewing records, labs, tests and studies, counseling and developing plan of care  Thurston Hole D. Lyn Hollingshead, MD

## 2017-08-28 ENCOUNTER — Encounter: Payer: Self-pay | Admitting: Internal Medicine

## 2017-08-30 ENCOUNTER — Ambulatory Visit (INDEPENDENT_AMBULATORY_CARE_PROVIDER_SITE_OTHER): Payer: Medicare Other | Admitting: Orthopaedic Surgery

## 2017-08-30 ENCOUNTER — Encounter (INDEPENDENT_AMBULATORY_CARE_PROVIDER_SITE_OTHER): Payer: Self-pay | Admitting: Orthopaedic Surgery

## 2017-08-30 DIAGNOSIS — Z96642 Presence of left artificial hip joint: Secondary | ICD-10-CM

## 2017-08-30 NOTE — Progress Notes (Signed)
The patient is 2 weeks status post a left total hip arthroplasty and just over 3 months status post a right total hip arthroplasty.  She has been staying at a skilled nursing facility and doing excellent overall.  On exam her right operative hip from 3 months ago is moving excellently.  Her left hip today moves fine.  Her incision left side looks really good we remove the staples and placed Steri-Strips.  Her leg lengths are equal.  At this point she will transition to home therapy by the end of the week.  All questions concerns were answered and addressed.  We will see her back in a month see how she is doing overall but no x-rays are needed.

## 2017-08-31 ENCOUNTER — Encounter: Payer: Self-pay | Admitting: Internal Medicine

## 2017-08-31 ENCOUNTER — Non-Acute Institutional Stay (SKILLED_NURSING_FACILITY): Payer: Medicare Other | Admitting: Internal Medicine

## 2017-08-31 DIAGNOSIS — I10 Essential (primary) hypertension: Secondary | ICD-10-CM | POA: Diagnosis not present

## 2017-08-31 DIAGNOSIS — E119 Type 2 diabetes mellitus without complications: Secondary | ICD-10-CM

## 2017-08-31 DIAGNOSIS — Z96642 Presence of left artificial hip joint: Secondary | ICD-10-CM | POA: Diagnosis not present

## 2017-08-31 DIAGNOSIS — M1612 Unilateral primary osteoarthritis, left hip: Secondary | ICD-10-CM

## 2017-08-31 DIAGNOSIS — E785 Hyperlipidemia, unspecified: Secondary | ICD-10-CM | POA: Diagnosis not present

## 2017-08-31 DIAGNOSIS — E034 Atrophy of thyroid (acquired): Secondary | ICD-10-CM

## 2017-08-31 DIAGNOSIS — Z7901 Long term (current) use of anticoagulants: Secondary | ICD-10-CM

## 2017-08-31 DIAGNOSIS — K219 Gastro-esophageal reflux disease without esophagitis: Secondary | ICD-10-CM

## 2017-08-31 NOTE — Progress Notes (Signed)
Location:  [facility] Room Number: 107P Place of Service:  SNF (31)  Randon Goldsmith. Lyn Hollingshead, MD  Patient Care Team: Macy Mis, MD as PCP - General [facility])  Extended Emergency Contact Information Primary Emergency Contact: Stepheny, [place], [place] [address] [place] of [place] Home Phone: [phone] Relation: Relative Secondary Emergency Contact: MAYERS,ARLENE          [place], [place] of [place] Home Phone: [phone] Relation: Sister  Allergies  Allergen Reactions  . Latex Swelling  . Eggs Or Egg-Derived Products Other (See Comments)    Was told not to eat eggs because she had eczema as a child  . Losartan Swelling    Swelling of lips and face  . Other Other (See Comments)    Hives and facial swelling from all nuts (tree nuts and peanuts)  . Peanut-Containing Drug Products Hives and Swelling    Facial swelling  . Penicillins Other (See Comments)    Childhood allergy- convulstions Has patient had a PCN reaction causing immediate rash, facial/tongue/throat swelling, SOB or lightheadedness with hypotension: Yes Has patient had a PCN reaction causing severe rash involving mucus membranes or skin necrosis: No Has patient had a PCN reaction that required hospitalization: Yes Has patient had a PCN reaction occurring within the last 10 years: No If all of the above answers are "NO", then may proceed with Cephalosporin use.    Chief Complaint  Patient presents with  . Discharge Note    Discharge from [facility]    HPI:  72 y.o. female, history of DVT, GI bleed, AVMs of small bowel, diabetes mellitus type 2, chronic anticoagulation, who was admitted to [facility] from 7/9-12 where she underwent a total left hip arthroplasty.  There were no stated complications.  Chemoprophylaxis is with ASA 81 mg twice daily for 4 weeks patient was admitted to skilled nursing facility for OT/PT and is now ready to be  discharged home.    Past Medical History:  Diagnosis Date  . Acute blood loss anemia   . Angioedema 2018  . Asthma    not current  . AVM (arteriovenous malformation) of small bowel, acquired    Jejunum - capsule endo 09/2016  . Chronic anticoagulation   . Diabetes mellitus without complication (HCC)   . DVT (deep venous thrombosis) (HCC) 06/2016  . Dyspnea    with exertion  . Eczema   . Eczema   . GERD (gastroesophageal reflux disease) 08/17/2016  . GI bleed 12/04/2016  . Gout   . History of blood transfusion 11/2016   GI bleed  . Hyperlipemia   . Hypertension   . Hypomagnesemia   . Hypothyroidism   . Iron deficiency anemia   . Lip swelling 08/2016  . Melena 12/04/2016  . Open-angle glaucoma   . Osteoarthritis   . Osteoarthritis   . Pre-diabetes    05/13/17- "Dr said she will probably stop it"  . Primary osteoarthritis of right hip   . Right hip pain 12/07/2016  . Right knee pain 12/07/2016  . Status post total replacement of left hip   . Status post total replacement of right hip   . Unilateral primary osteoarthritis, left hip   . Vitamin D deficiency     Past Surgical History:  Procedure Laterality Date  . ABDOMINAL HYSTERECTOMY    . COLONOSCOPY  2018   [place] - one polyp  .  ENTEROSCOPY N/A 12/06/2016   Procedure: ENTEROSCOPY;  Surgeon: Sherrilyn Rist, MD;  Location: [facility] ENDOSCOPY;  Service: Gastroenterology;  Laterality: N/A;  . ESOPHAGOGASTRODUODENOSCOPY  2018   GA - gastritis  . GIVENS CAPSULE STUDY  2018   GA - jejunal AVM's, gastritis 2h 6 min small bowel passage, AVM first at 1 hr post duodenum  . SMALL BOWEL ENTEROSCOPY  03/21/2017  . THYROIDECTOMY    . TOTAL HIP ARTHROPLASTY Right 05/24/2017   Procedure: RIGHT TOTAL HIP ARTHROPLASTY ANTERIOR APPROACH;  Surgeon: Kathryne Hitch, MD;  Location: [facility] OR;  Service: Orthopedics;  Laterality: Right;  . TOTAL HIP ARTHROPLASTY Left 08/16/2017   Procedure: LEFT TOTAL HIP ARTHROPLASTY ANTERIOR APPROACH;   Surgeon: Kathryne Hitch, MD;  Location: [facility] ORS;  Service: Orthopedics;  Laterality: Left;     reports that she has never smoked. She has never used smokeless tobacco. She reports that she does not drink alcohol or use drugs. Social History   Socioeconomic History  . Marital status: Single    Spouse name: Not on file  . Number of children: 2  . Years of education: 28  . Highest education level: Not on file  Occupational History  . Occupation: retired Child psychotherapist  Social Needs  . Financial resource strain: Not on file  . Food insecurity:    Worry: Not on file    Inability: Not on file  . Transportation needs:    Medical: Not on file    Non-medical: Not on file  Tobacco Use  . Smoking status: Never Smoker  . Smokeless tobacco: Never Used  Substance and Sexual Activity  . Alcohol use: No  . Drug use: No  . Sexual activity: Not on file  Lifestyle  . Physical activity:    Days per week: Not on file    Minutes per session: Not on file  . Stress: Not on file  Relationships  . Social connections:    Talks on phone: Not on file    Gets together: Not on file    Attends religious service: Not on file    Active member of club or organization: Not on file    Attends meetings of clubs or organizations: Not on file    Relationship status: Not on file  . Intimate partner violence:    Fear of current or ex partner: Not on file    Emotionally abused: Not on file    Physically abused: Not on file    Forced sexual activity: Not on file  Other Topics Concern  . Not on file  Social History Narrative   HS graduate   Retired [employer]   Son lives [place]   Daughter in [place] - 4--# septic shock - deceased? - was in [place] caring for her   Admitted to [facility] 12/10/16   Never married   Never smoked   Alcohol none   Full Code    Pertinent  Health Maintenance Due  Topic Date Due  . MAMMOGRAM  10/31/1997  . DEXA SCAN  10/31/2012  .  INFLUENZA VACCINE  09/08/2017  . PNA vac Low Risk Adult (1 of 2 - PCV13) 10/01/2017 (Originally 04/05/2013)  . FOOT EXAM  11/01/2017 (Originally 10/31/1955)  . OPHTHALMOLOGY EXAM  11/01/2017 (Originally 10/31/1955)  . HEMOGLOBIN A1C  02/08/2018  . COLONOSCOPY  06/18/2026    Medications: Allergies as of 08/31/2017      Reactions   Latex Swelling  Eggs Or Egg-derived Products Other (See Comments)   Was told not to eat eggs because she had eczema as a child   Losartan Swelling   Swelling of lips and face   Other Other (See Comments)   Hives and facial swelling from all nuts (tree nuts and peanuts)   Peanut-containing Drug Products Hives, Swelling   Facial swelling   Penicillins Other (See Comments)   Childhood allergy- convulstions Has patient had a PCN reaction causing immediate rash, facial/tongue/throat swelling, SOB or lightheadedness with hypotension: Yes Has patient had a PCN reaction causing severe rash involving mucus membranes or skin necrosis: No Has patient had a PCN reaction that required hospitalization: Yes Has patient had a PCN reaction occurring within the last 10 years: No If all of the above answers are "NO", then may proceed with Cephalosporin use.      Medication List        Accurate as of 08/31/17 11:59 PM. Always use your most recent med list.          allopurinol 100 MG tablet Commonly known as:  ZYLOPRIM Take 100 mg daily by mouth.   amLODipine 5 MG tablet Commonly known as:  NORVASC Take 5 mg by mouth daily.   aspirin 81 MG chewable tablet Commonly known as:  ASPIRIN CHILDRENS Chew 1 tablet (81 mg total) by mouth 2 (two) times daily after a meal.   famotidine 40 MG tablet Commonly known as:  PEPCID Take 1 tablet (40 mg total) by mouth daily.   ferrous sulfate 325 (65 FE) MG tablet Take 1 tablet (325 mg total) by mouth daily.   hydrALAZINE 25 MG tablet Commonly known as:  APRESOLINE Take 25 mg by mouth 2 (two) times daily.   levothyroxine  112 MCG tablet Commonly known as:  SYNTHROID, LEVOTHROID Take 112 mcg by mouth daily before lunch.   metFORMIN 750 MG 24 hr tablet Commonly known as:  GLUCOPHAGE-XR Take 750 mg by mouth 2 (two) times daily.   methocarbamol 500 MG tablet Commonly known as:  ROBAXIN Take 1 tablet (500 mg total) by mouth every 6 (six) hours as needed for muscle spasms.   metoprolol succinate 50 MG 24 hr tablet Commonly known as:  TOPROL-XL Take 50 mg by mouth daily. Take with or immediately following a meal.   pravastatin 20 MG tablet Commonly known as:  PRAVACHOL Take 20 mg by mouth every evening.   triamcinolone ointment 0.1 % Commonly known as:  KENALOG Apply 1 application topically 2 (two) times daily.   VENTOLIN HFA 108 (90 Base) MCG/ACT inhaler Generic drug:  albuterol Inhale 2 puffs into the lungs every 6 (six) hours as needed for wheezing.        Vitals:   08/31/17 1033  BP: 129/79  Pulse: 84  Resp: 18  Temp: (!) 97 F (36.1 C)  SpO2: 96%  Weight: 167 lb (75.8 kg)  Height: 5\' 3"  (1.6 m)   Body mass index is 29.58 kg/m.  Physical Exam  GENERAL APPEARANCE: Alert, conversant. No acute distress.  HEENT: Unremarkable. RESPIRATORY: Breathing is even, unlabored. Lung sounds are clear   CARDIOVASCULAR: Heart RRR no murmurs, rubs or gallops. No peripheral edema.  GASTROINTESTINAL: Abdomen is soft, non-tender, not distended w/ normal bowel sounds.  NEUROLOGIC: Cranial nerves 2-12 grossly intact. Moves all extremities   Labs reviewed: Basic Metabolic Panel: Recent Labs    12/06/16 0908 12/07/16 0510 12/08/16 0541  05/25/17 0522 05/28/17 08/08/17 1038 08/17/17 0557  NA 140 139 137   < >  136 141 140 141  K 3.8 3.6 4.1   < > 4.5 4.0 3.8 3.9  CL 104 106 103   < > 104  --  104 107  CO2 27 25 25    < > 22  --  23 26  GLUCOSE 97 106* 102*   < > 114*  --  91 115*  BUN 7 7 9    < > 12 12 12 11   CREATININE 0.73 0.72 0.75   < > 0.59 0.4* 0.59 0.60  CALCIUM 9.1 8.7* 8.9   < > 9.4   --  9.7 8.8*  MG 1.6* 1.5* 1.8  --   --   --   --   --   PHOS 3.3 3.5  --   --   --   --   --   --    < > = values in this interval not displayed.   No results found for: [facility] Liver Function Tests: Recent Labs    12/08/16 0541 12/09/16 0654 02/18/17 1216  AST 15 16 10   ALT 11* 11* 10  ALKPHOS 70 73 78  BILITOT 0.6 1.2 0.4  PROT 6.0* 6.5 8.0  ALBUMIN 2.7* 2.7* 3.9   Recent Labs    12/04/16 1251  LIPASE 16   No results for input(s): AMMONIA in the last 8760 hours. CBC: Recent Labs    12/10/16 0400  02/18/17 1122  05/28/17  08/17/17 0557 08/18/17 0548 08/19/17 0524  WBC 10.4   < > 7.6   < > 9.6   < > 5.8 11.4* 8.7  NEUTROABS 7.3  --  5.1  --  6  --   --   --   --   HGB 9.0*   < > 10.1*   < > 8.8*   < > 9.1* 8.1* 7.9*  HCT 28.8*   < > 33.3*   < > 28*   < > 29.0* 25.5* 24.5*  MCV 83.2  --  71.3*   < >  --    < > 74.7* 74.1* 74.2*  PLT 400   < > 527.0*   < > 342   < > 263 266 269   < > = values in this interval not displayed.   Lipid No results for input(s): CHOL, HDL, LDLCALC, TRIG in the last 8760 hours. Cardiac Enzymes: No results for input(s): CKTOTAL, CKMB, CKMBINDEX, TROPONINI in the last 8760 hours. BNP: No results for input(s): BNP in the last 8760 hours. CBG: Recent Labs    08/18/17 1724 08/18/17 2054 08/19/17 0735  GLUCAP 109* 117* 100*    Procedures and Imaging Studies During Stay: Dg Pelvis Portable  Result Date: 08/16/2017 CLINICAL DATA:  Left anterior approach hip arthroplasty. EXAM: PORTABLE PELVIS 1-2 VIEWS COMPARISON:  Same day intraoperative fluoroscopic images. FINDINGS: Soft tissue emphysema and overlying skin staples are identified about the left hip where there is an uncemented total press fit left hip arthroplasty. No complicating features or fracture. IMPRESSION: Left total hip arthroplasty without complicating features. These results will be called to the ordering clinician or representative by the Radiologist Assistant, and  communication documented in the PACS or zVision Dashboard. Electronically Signed   By: Tollie Ethavid  Kwon M.D.   On: 08/16/2017 16:20   Dg C-arm 1-60 Min-no Report  Result Date: 08/16/2017 Fluoroscopy was utilized by the requesting physician.  No radiographic interpretation.   Dg Hip Operative Unilat W Or W/o Pelvis Left  Result Date: 08/16/2017 CLINICAL DATA:  Left  hip replacement EXAM: OPERATIVE left HIP (WITH PELVIS IF PERFORMED) 2 VIEWS TECHNIQUE: Fluoroscopic spot image(s) were submitted for interpretation post-operatively. COMPARISON:  Left hip films 07/12/2017 FINDINGS: Two C-arm spot films show the acetabular and femoral components of the left total hip replacement to be in good position. No complicating features are seen. Portions of the right total hip replacement are also noted. IMPRESSION: Left total hip replacement components in good position. No complicating features. Electronically Signed   By: Dwyane Dee M.D.   On: 08/16/2017 15:19    Assessment/Plan:   Unilateral primary osteoarthritis, left hip  Status post total replacement of left hip  Essential hypertension  Hyperlipidemia, unspecified hyperlipidemia type  Hypothyroidism due to acquired atrophy of thyroid  Gastroesophageal reflux disease without esophagitis  Diabetes mellitus without complication (HCC)  Chronic anticoagulation   Patient is being discharged with the following home health services: OT/PT  Patient is being discharged with the following durable medical equipment: None  Patient has been advised to f/u with their PCP in 1-2 weeks to bring them up to date on their rehab stay.  Social services at facility was responsible for arranging this appointment.  Pt was provided with a 30 day supply of prescriptions for medications and refills must be obtained from their PCP.  For controlled substances, a more limited supply may be provided adequate until PCP appointment only.  Medications have been reconciled.  Time  spent greater than 30 minutes;> 50% of time with patient was spent reviewing records, labs, tests and studies, counseling and developing plan of care  Randon Goldsmith. Lyn Hollingshead, MD

## 2017-09-01 ENCOUNTER — Encounter: Payer: Self-pay | Admitting: Internal Medicine

## 2017-09-03 ENCOUNTER — Other Ambulatory Visit: Payer: Self-pay | Admitting: Internal Medicine

## 2017-09-05 ENCOUNTER — Telehealth (INDEPENDENT_AMBULATORY_CARE_PROVIDER_SITE_OTHER): Payer: Self-pay | Admitting: Orthopaedic Surgery

## 2017-09-05 NOTE — Telephone Encounter (Signed)
Verbal order given  

## 2017-09-05 NOTE — Telephone Encounter (Signed)
Dana Nelson-(OT) with [facility] called left voicemail message stating completed HHOT eval and  need verbal orders for HHOT 2 Wk 1, Hold for 1 wk   09/11/17 thru 09/16/17  Patient out of town   Will resume for 2 wk 3   The number to contact Marcelino DusterMichelle is [phone]

## 2017-09-05 NOTE — Telephone Encounter (Signed)
Amy-(PT) called needing verbal orders for HHPT 1 wk 1, 2 wk 2, 0 wk 1, and 2 wk 2. The number to contact Amy is [phone]

## 2017-09-12 ENCOUNTER — Encounter: Payer: Self-pay | Admitting: Internal Medicine

## 2017-09-23 ENCOUNTER — Telehealth (INDEPENDENT_AMBULATORY_CARE_PROVIDER_SITE_OTHER): Payer: Self-pay | Admitting: Orthopaedic Surgery

## 2017-09-23 NOTE — Telephone Encounter (Signed)
Beth--nurse from [facility] called asked if Dr Magnus IvanBlackman will sign for patient's plan of care. Beth also need office note faxed to her for DOS 08/30/17. The number to contact Waynetta SandyBeth is [phone]

## 2017-09-23 NOTE — Telephone Encounter (Signed)
The fax# is [phone]

## 2017-09-26 ENCOUNTER — Telehealth (INDEPENDENT_AMBULATORY_CARE_PROVIDER_SITE_OTHER): Payer: Self-pay | Admitting: Orthopaedic Surgery

## 2017-09-26 NOTE — Telephone Encounter (Signed)
Dana Nelson [facility] at [facility] left voicemail wanting to let Dr. Magnus IvanBlackman know that patient is doing very well and that she has reached all of her goals prior to estimated date. They will discharge her Thursday from home health OT as well as PT since she has reached all goals there as well. If you need to speak with her # [phone].

## 2017-09-27 ENCOUNTER — Ambulatory Visit (INDEPENDENT_AMBULATORY_CARE_PROVIDER_SITE_OTHER): Payer: Medicare Other | Admitting: Orthopaedic Surgery

## 2017-09-27 ENCOUNTER — Encounter (INDEPENDENT_AMBULATORY_CARE_PROVIDER_SITE_OTHER): Payer: Self-pay | Admitting: Orthopaedic Surgery

## 2017-09-27 DIAGNOSIS — Z96642 Presence of left artificial hip joint: Secondary | ICD-10-CM

## 2017-09-27 NOTE — Progress Notes (Signed)
Patient is now about 6 weeks to 7 weeks status post a left total hip arthroplasty.  She is someone who has significantly bad knees as well as tangentially her other hip and her right shoulder.  She is ambulate with a rolling walker.  She does feel like she is making progress.  On exam her incision looks great and is healed in the left hip.  Her leg lengths are equal.  She tolerates me putting her hip the range of motion.  She is significantly deconditioned and has a slow and waddling gait.  She is going start home exercise program and get back to a gym at this point.  She is cleared to drive from my standpoint.  We will see her back in 4 weeks to see how she is doing overall.  I would like just a low AP pelvis at that visit.  Eventually will have to work-up her knees and shoulder as well.

## 2017-10-25 ENCOUNTER — Ambulatory Visit (INDEPENDENT_AMBULATORY_CARE_PROVIDER_SITE_OTHER): Payer: Medicare Other | Admitting: Orthopaedic Surgery

## 2017-10-25 ENCOUNTER — Telehealth (INDEPENDENT_AMBULATORY_CARE_PROVIDER_SITE_OTHER): Payer: Self-pay | Admitting: Orthopaedic Surgery

## 2017-10-25 NOTE — Telephone Encounter (Signed)
Patient called left voicemail message stating she is in the hospital in [place]. Patient asked if Dr Magnus IvanBlackman would call her. The number to contact patient is [phone]

## 2017-10-25 NOTE — Telephone Encounter (Signed)
Patient is currently hospitalized in [place]. She has pockets of fluid built up by surgical incision. Patient is refusing them to drain it because she does not want them touching her, she only wants Dr. Magnus IvanBlackman to since he did the surgery. She wants to know what she should do? Be transferred to [facility]? Please advise  # [phone] or # [phone]

## 2017-10-25 NOTE — Telephone Encounter (Signed)
Patient would like you to call her asap I called to speak with her and she asked if she could please speak with you

## 2017-10-26 ENCOUNTER — Telehealth (INDEPENDENT_AMBULATORY_CARE_PROVIDER_SITE_OTHER): Payer: Self-pay | Admitting: Orthopaedic Surgery

## 2017-10-26 NOTE — Telephone Encounter (Signed)
See below, I sent you another message yesterday on her

## 2017-10-26 NOTE — Telephone Encounter (Signed)
Patient called left voicemail message stating she is still in the hospital in [place]. Patient asked  if the doctor call Dr Magnus IvanBlackman yet? The number to contact patient is  [phone]

## 2017-10-27 ENCOUNTER — Encounter (INDEPENDENT_AMBULATORY_CARE_PROVIDER_SITE_OTHER): Payer: Self-pay | Admitting: Orthopaedic Surgery

## 2017-10-27 ENCOUNTER — Ambulatory Visit (INDEPENDENT_AMBULATORY_CARE_PROVIDER_SITE_OTHER): Payer: Medicare Other | Admitting: Orthopaedic Surgery

## 2017-10-27 DIAGNOSIS — Z96642 Presence of left artificial hip joint: Secondary | ICD-10-CM

## 2017-10-27 MED ORDER — GENERIC EXTERNAL MEDICATION
10.00 | Status: DC
Start: ? — End: 2017-10-27

## 2017-10-27 MED ORDER — ALBUTEROL SULFATE (2.5 MG/3ML) 0.083% IN NEBU
2.50 | INHALATION_SOLUTION | RESPIRATORY_TRACT | Status: DC
Start: ? — End: 2017-10-27

## 2017-10-27 MED ORDER — GENERIC EXTERNAL MEDICATION
Status: DC
Start: ? — End: 2017-10-27

## 2017-10-27 MED ORDER — METOPROLOL SUCCINATE ER 50 MG PO TB24
50.00 | ORAL_TABLET | ORAL | Status: DC
Start: 2017-10-27 — End: 2017-10-27

## 2017-10-27 MED ORDER — GENERIC EXTERNAL MEDICATION
4.00 | Status: DC
Start: ? — End: 2017-10-27

## 2017-10-27 MED ORDER — HYDRALAZINE HCL 20 MG/ML IJ SOLN
10.00 | INTRAMUSCULAR | Status: DC
Start: ? — End: 2017-10-27

## 2017-10-27 MED ORDER — GENERIC EXTERNAL MEDICATION
2.00 | Status: DC
Start: 2017-10-27 — End: 2017-10-27

## 2017-10-27 MED ORDER — FERROUS SULFATE 325 (65 FE) MG PO TABS
325.00 | ORAL_TABLET | ORAL | Status: DC
Start: 2017-10-27 — End: 2017-10-27

## 2017-10-27 MED ORDER — ESCITALOPRAM OXALATE 10 MG PO TABS
10.00 | ORAL_TABLET | ORAL | Status: DC
Start: 2017-10-27 — End: 2017-10-27

## 2017-10-27 MED ORDER — GENERIC EXTERNAL MEDICATION
650.00 | Status: DC
Start: ? — End: 2017-10-27

## 2017-10-27 MED ORDER — CLOTRIMAZOLE 1 % EX CREA
TOPICAL_CREAM | CUTANEOUS | Status: DC
Start: ? — End: 2017-10-27

## 2017-10-27 MED ORDER — HYDRALAZINE HCL 25 MG PO TABS
25.00 | ORAL_TABLET | ORAL | Status: DC
Start: 2017-10-26 — End: 2017-10-27

## 2017-10-27 MED ORDER — AMLODIPINE BESYLATE 5 MG PO TABS
5.00 | ORAL_TABLET | ORAL | Status: DC
Start: 2017-10-27 — End: 2017-10-27

## 2017-10-27 MED ORDER — PRAVASTATIN SODIUM 20 MG PO TABS
20.00 | ORAL_TABLET | ORAL | Status: DC
Start: 2017-10-26 — End: 2017-10-27

## 2017-10-27 MED ORDER — LEVOTHYROXINE SODIUM 112 MCG PO TABS
112.00 | ORAL_TABLET | ORAL | Status: DC
Start: 2017-10-26 — End: 2017-10-27

## 2017-10-27 MED ORDER — ACETAMINOPHEN 650 MG RE SUPP
650.00 | RECTAL | Status: DC
Start: ? — End: 2017-10-27

## 2017-10-27 MED ORDER — NITROGLYCERIN 0.4 MG SL SUBL
0.40 | SUBLINGUAL_TABLET | SUBLINGUAL | Status: DC
Start: ? — End: 2017-10-27

## 2017-10-27 MED ORDER — ASPIRIN 81 MG PO CHEW
81.00 | CHEWABLE_TABLET | ORAL | Status: DC
Start: 2017-10-27 — End: 2017-10-27

## 2017-10-27 MED ORDER — FAMOTIDINE 20 MG PO TABS
40.00 | ORAL_TABLET | ORAL | Status: DC
Start: 2017-10-27 — End: 2017-10-27

## 2017-10-27 MED ORDER — ALLOPURINOL 100 MG PO TABS
100.00 | ORAL_TABLET | ORAL | Status: DC
Start: 2017-10-27 — End: 2017-10-27

## 2017-10-27 MED ORDER — GENERIC EXTERNAL MEDICATION
750.00 | Status: DC
Start: 2017-10-26 — End: 2017-10-27

## 2017-10-27 MED ORDER — ACETAMINOPHEN 325 MG PO TABS
650.00 | ORAL_TABLET | ORAL | Status: DC
Start: ? — End: 2017-10-27

## 2017-10-27 MED ORDER — SODIUM CHLORIDE 0.9 % IV SOLN
10.00 | INTRAVENOUS | Status: DC
Start: ? — End: 2017-10-27

## 2017-10-27 NOTE — Progress Notes (Signed)
The patient is coming today 72 days status post a left total hip arthroplasty.  She was recently admitted to an outlying hospital and they wanted to take her to the operating room fearing that she had infected hip.  She did come in with a high white blood cell count medical there is a fluid collection there but her bigger problem was a swollen leg.  I did check her for a DVT which was negative.  I was able to see the CT scan and she has appropriate fluid from having surgery to that she has had in someone who is morbidly obese.  On examination I can easily put her left operative hip through internal extra rotation with no discomfort at all.  Her incision is healed over completely.  There is no cellulitis or redness around the incision of the skin.  I did place an 18-gauge needle in the incision and down to the joint and did not get any significant fluid.  I again reviewed the CT scan and did not see anything consistent with an abscess at all.  She is afebrile now her vital signs are stable and she is denying any pain.  She does have significantly swollen left foot and ankle.  She is wearing compressive hose.  From my standpoint she will continue the compressive garments until the swelling subsides.  She is apparently moving out of state so she can always see follow-up with a vascular surgeon in that area if any issues arise.  All question concerns were answered and addressed.  She understands that my contact numbers available to her and I can talk to any physician where she is at any time.

## 2018-06-14 ENCOUNTER — Telehealth: Payer: Self-pay | Admitting: Orthopaedic Surgery

## 2018-06-14 NOTE — Telephone Encounter (Signed)
Patient aware of the below message  

## 2018-06-14 NOTE — Telephone Encounter (Signed)
She needs to try compressive garments such as TED hose or compression hose on her bilateral lower extremities that can help with swelling.  She should elevate her legs above the heart level for the next 24 to 48 hours intermittently to help with that swelling.  She may need to see at least a primary care physician down in [place] to make sure she does not have anything like a blood clot.  Obviously if she is having worsening symptoms she can always go to a local emergency room.  I unfortunately do not know of any orthopedic physician in [place] that I can refer her to.  She can certainly find out from a primary care physician down there if they have some other they would refer her to from an orthopedic standpoint.

## 2018-06-14 NOTE — Telephone Encounter (Signed)
See below

## 2018-06-14 NOTE — Telephone Encounter (Signed)
Patient states her left leg is swollen(from feet to knee) and she needs to know what can be done to help with that. She also states she has moved to [facility] and would for Dr Magnus Ivan to refer her to someone in the area if possible.

## 2018-11-14 IMAGING — DX DG HIP (WITH OR WITHOUT PELVIS) 2-3V*R*
3 series · 3 of 3 positions shown · non-contrast
Comparison: None.

CLINICAL DATA: Chronic right hip pain.

EXAM:
DG HIP (WITH OR WITHOUT PELVIS) 2-3V RIGHT

[pelvis ap]
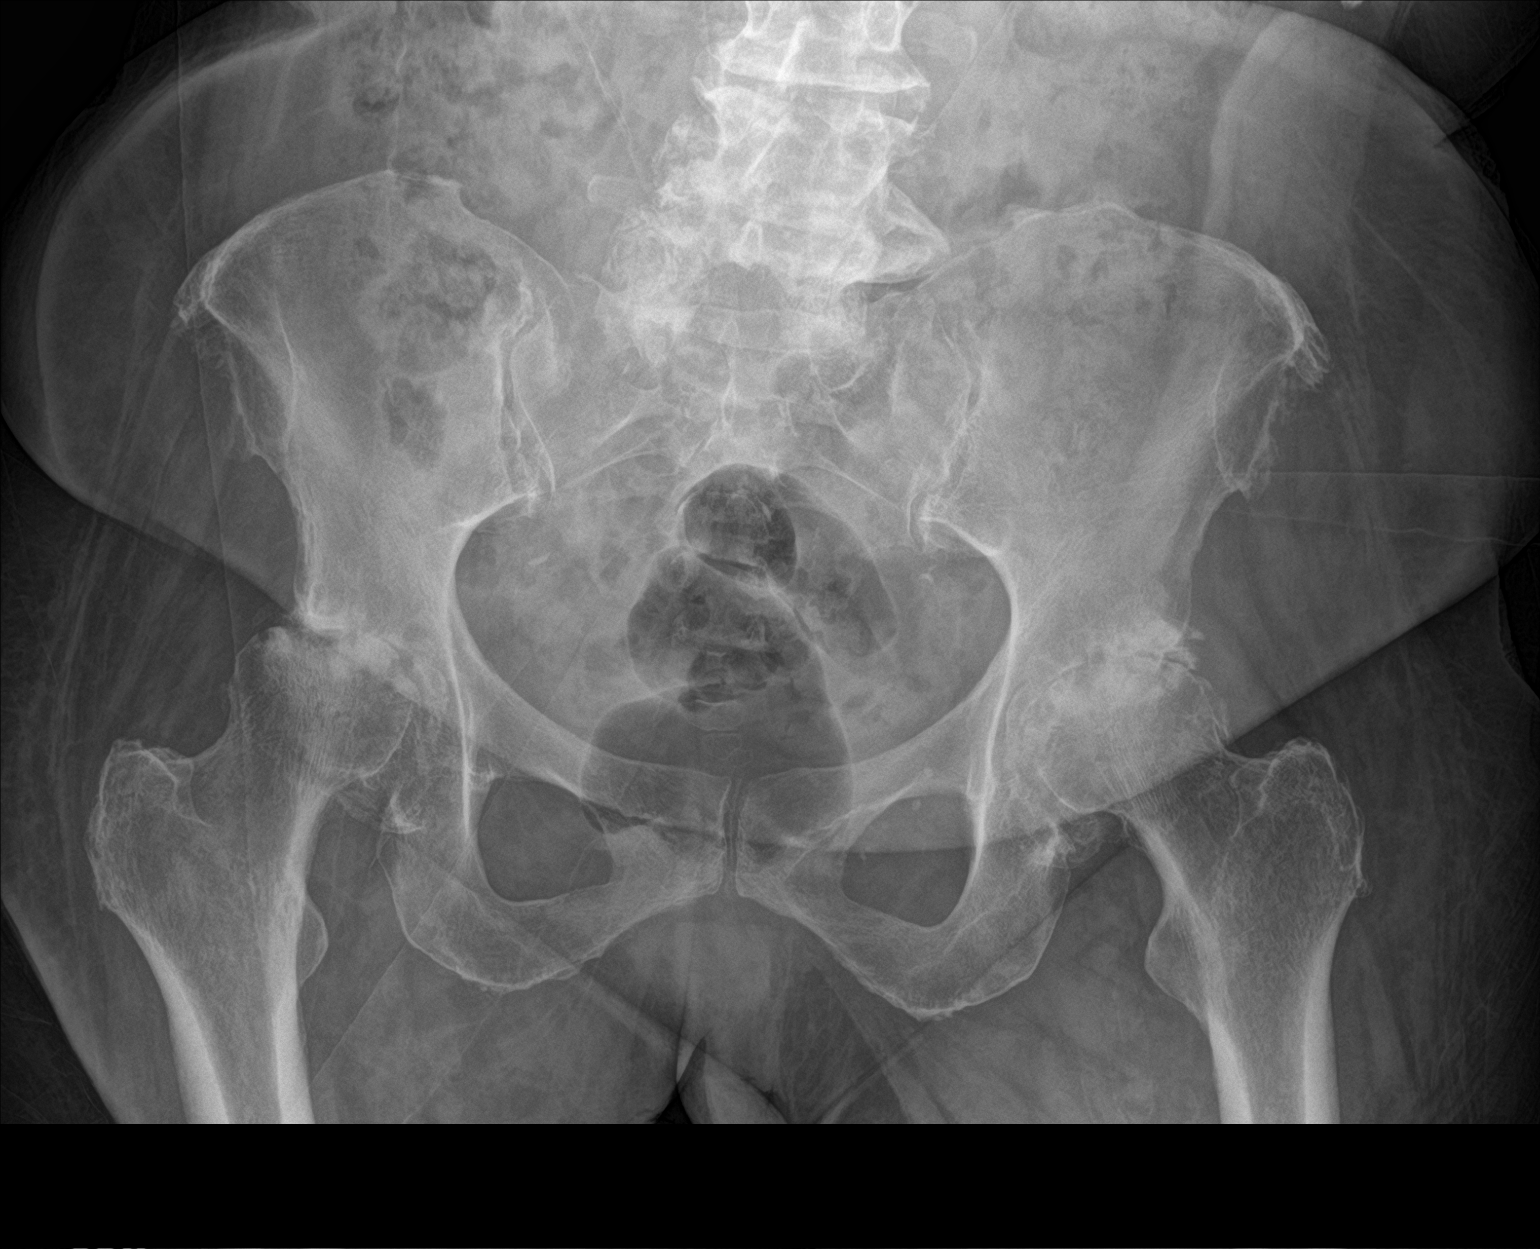

[hip lat]
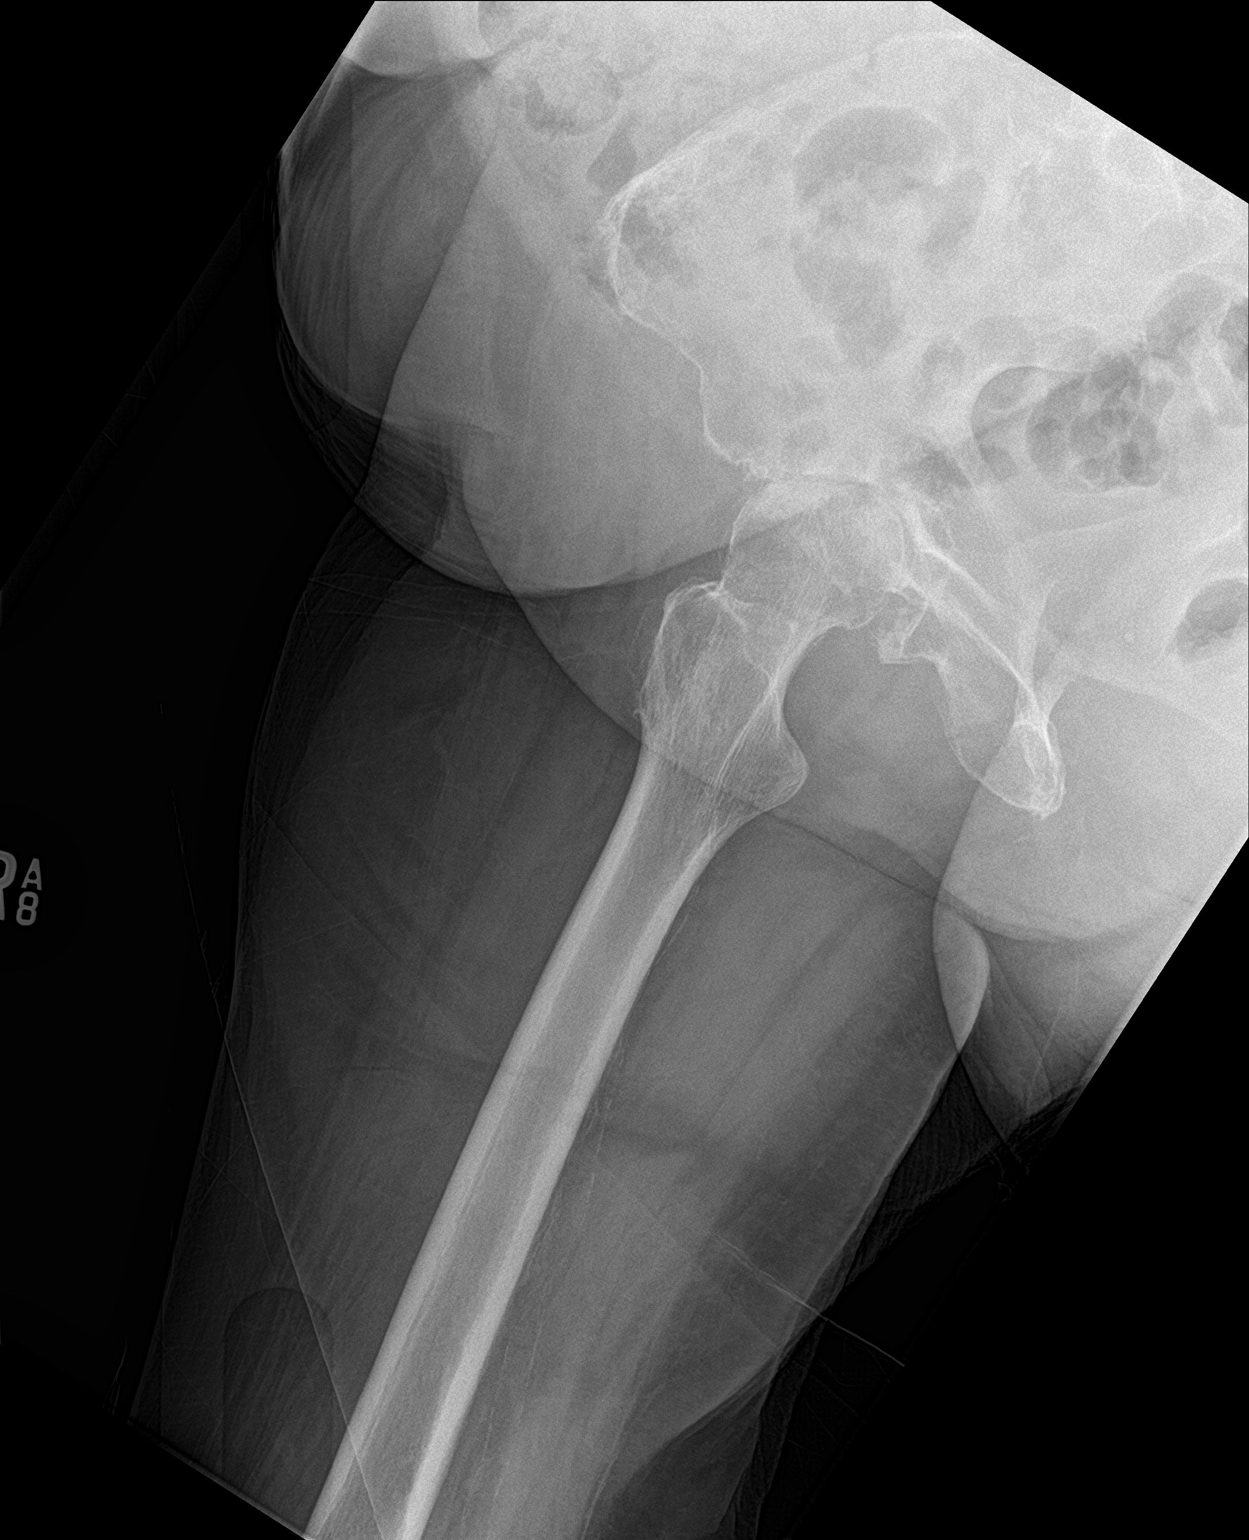

[hip ap]
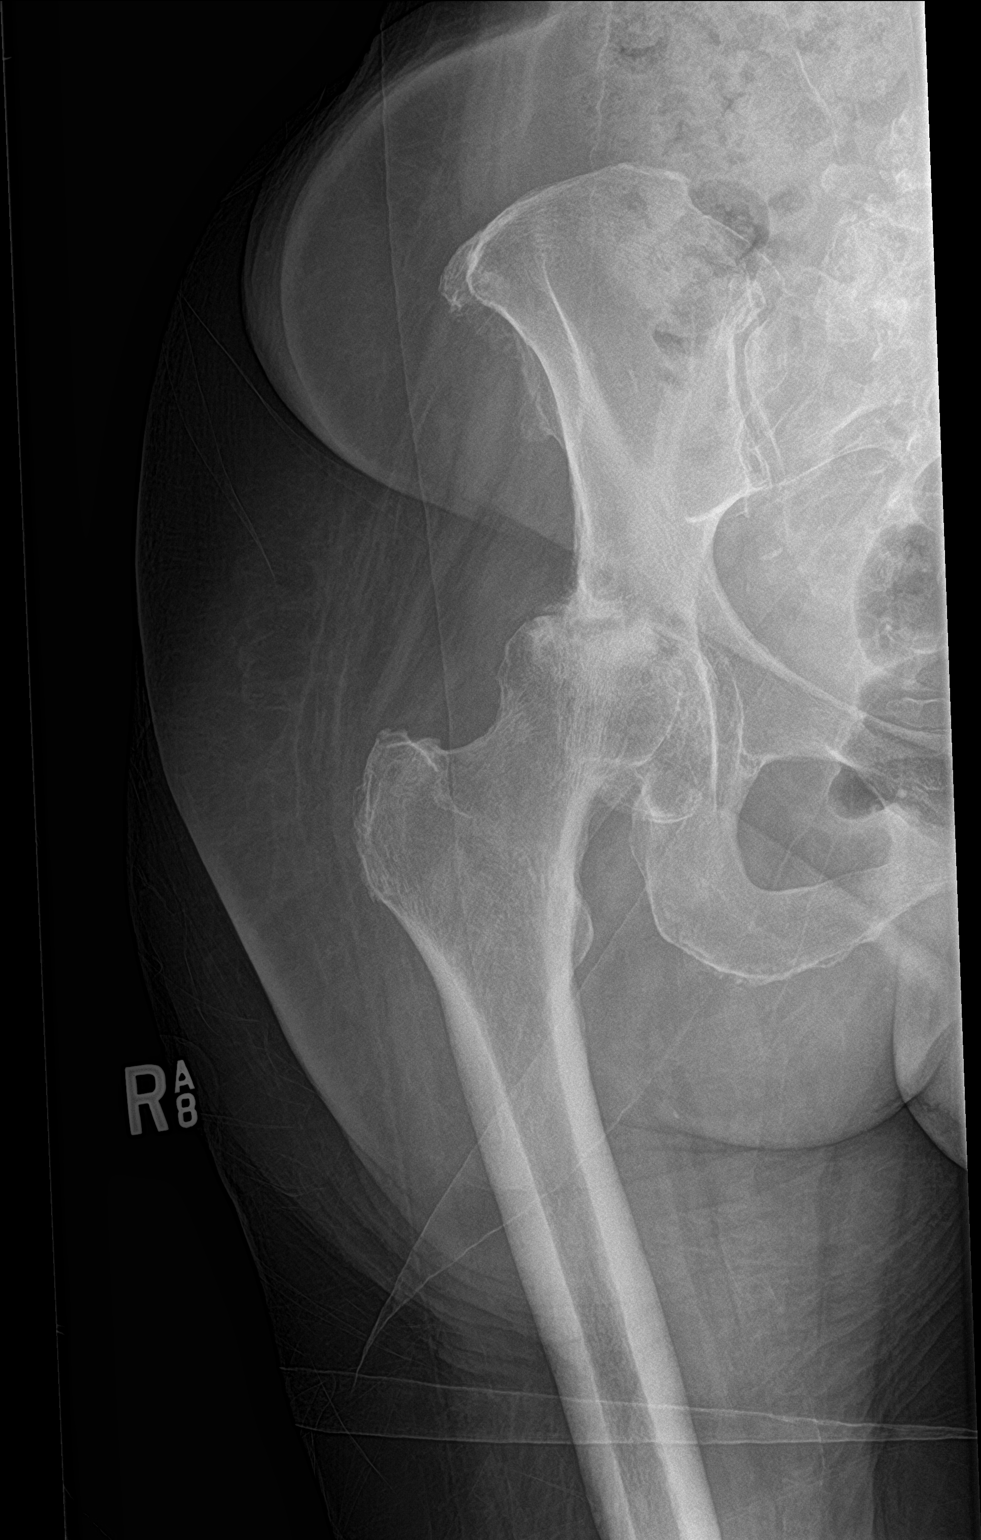

[3 of 3 positions shown; findings below may reference images not displayed]

FINDINGS: Severe narrowing of both hip joints is noted consistent with severe
degenerative joint disease. Sclerosis and lucency is seen involving
both femoral heads suggesting avascular necrosis. No acute fracture
or dislocation is noted
IMPRESSION: Probable severe bilateral avascular necrosis is noted with resultant
severe degenerative disc disease of both hips. No acute abnormality
is noted.

## 2018-11-14 IMAGING — DX DG KNEE COMPLETE 4+V*R*
4 series · 4 of 4 positions shown · non-contrast
Comparison: None.

CLINICAL DATA: Chronic right knee pain.

EXAM:
RIGHT KNEE - COMPLETE 4+ VIEW

[knee ap]
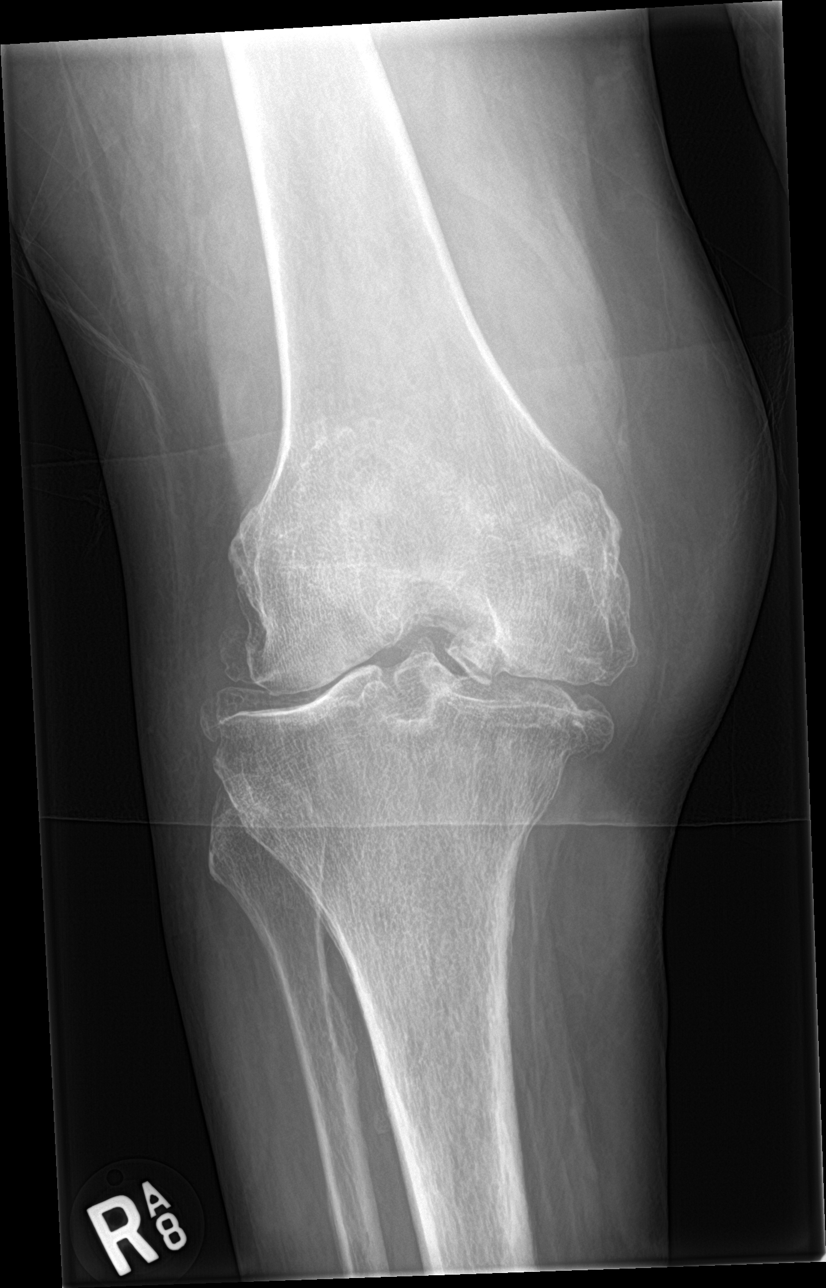

[knee lat]
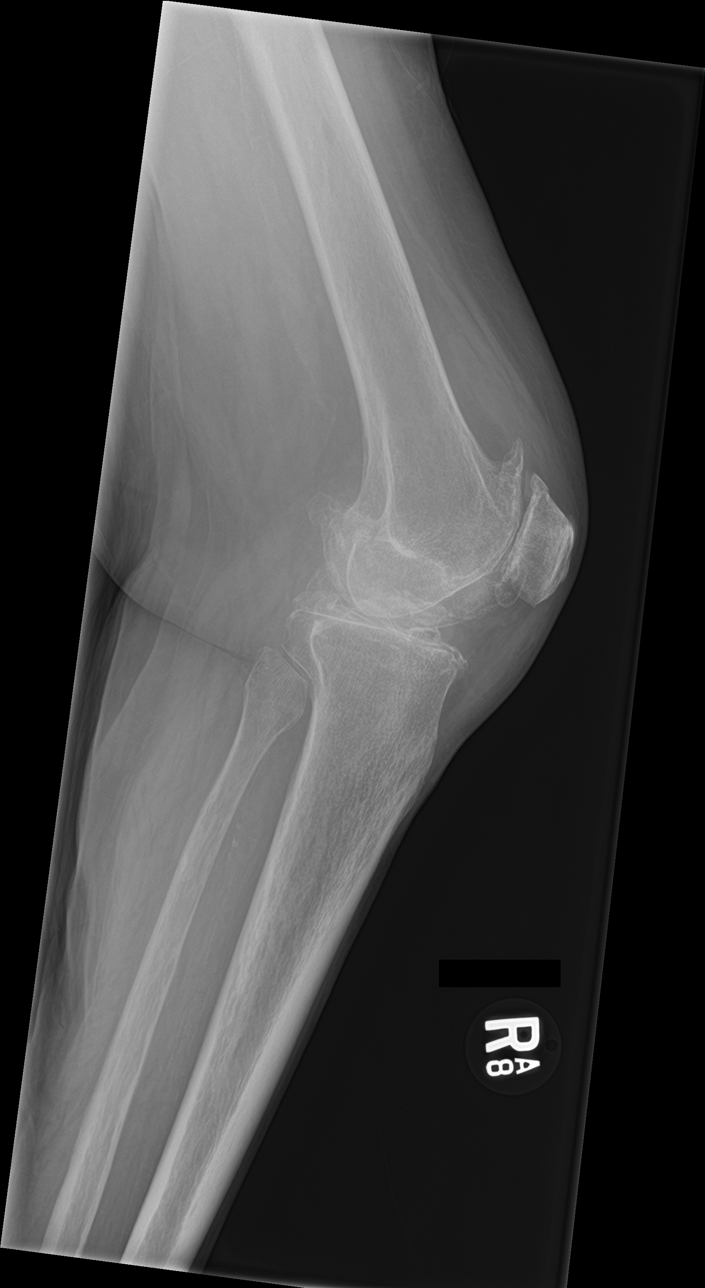

[knee obl (1 of 2)]
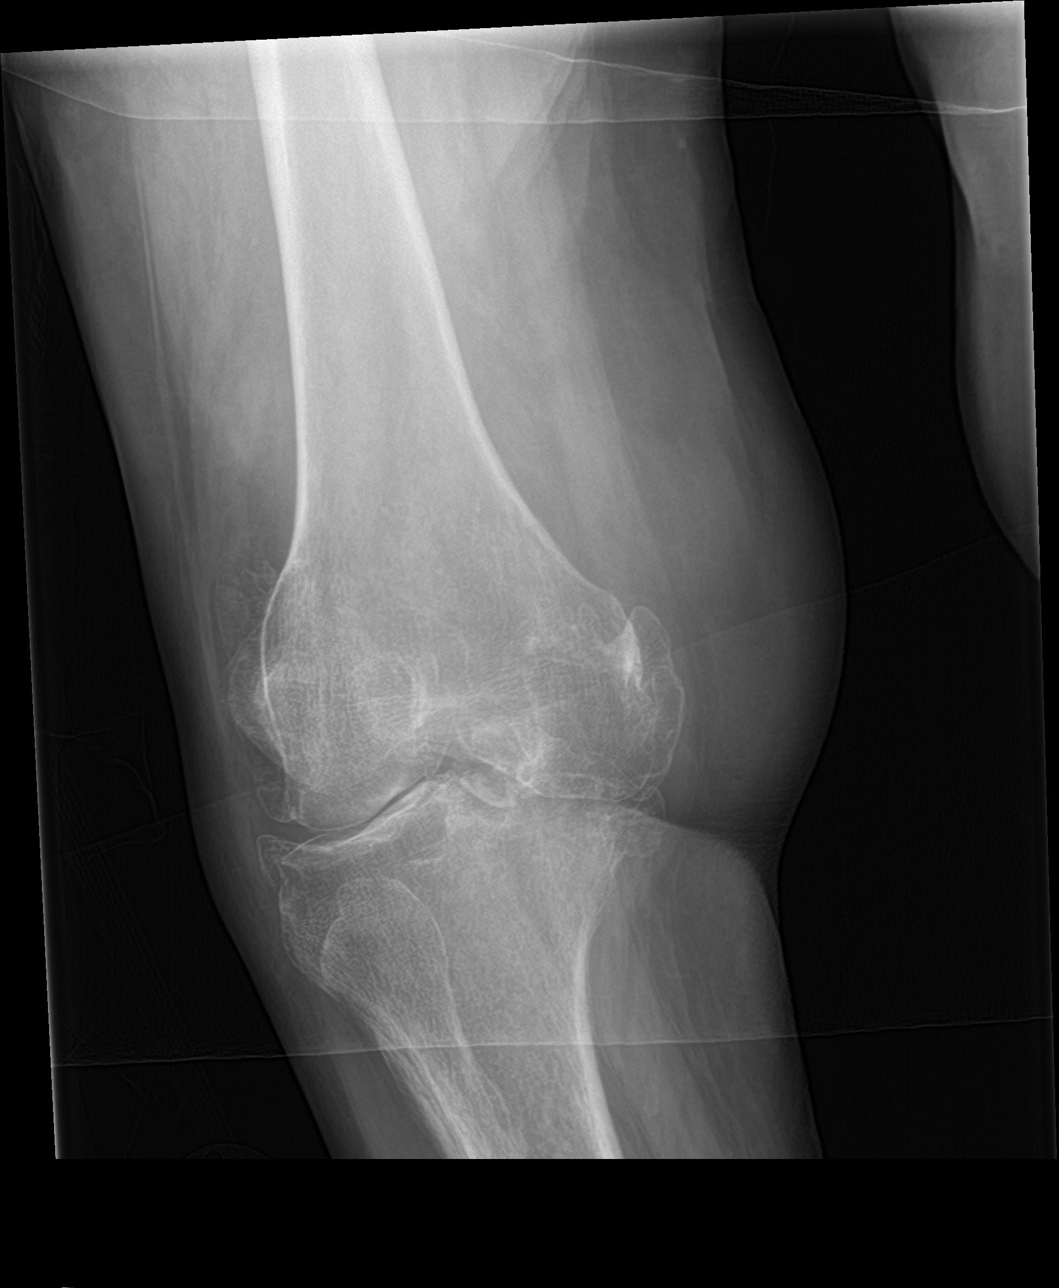

[knee obl (2 of 2)]
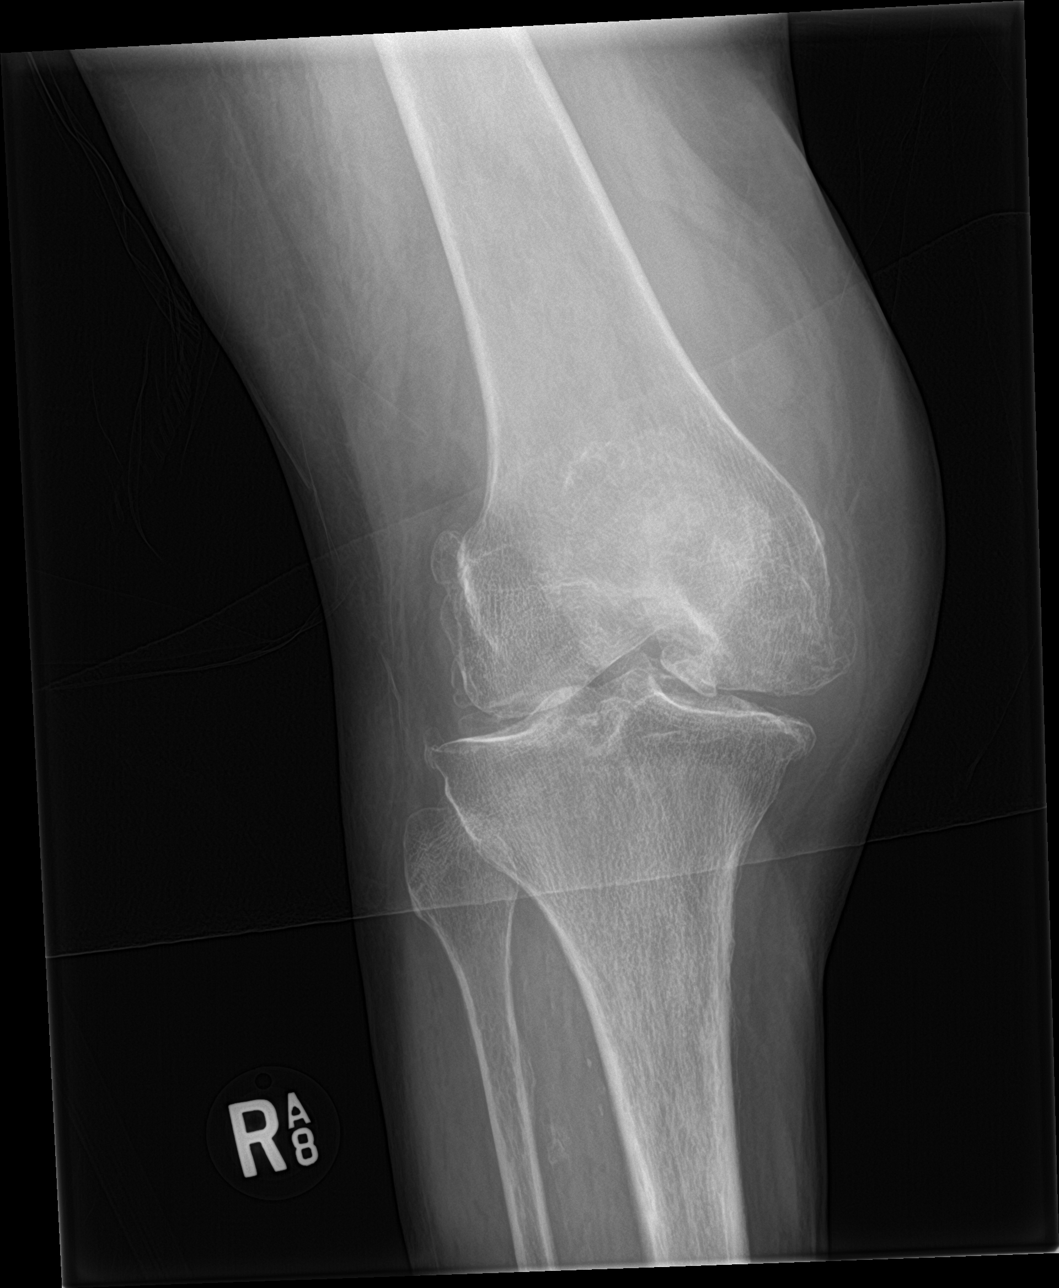

[4 of 4 positions shown; findings below may reference images not displayed]

FINDINGS: No evidence of fracture, dislocation, or joint effusion. Severe
narrowing of patellofemoral space is noted with osteophyte
formation. Moderate narrowing of the medial and lateral joint spaces
is noted with osteophyte formation. Soft tissues are unremarkable.
IMPRESSION: Moderate to severe degenerative joint disease. No acute abnormality
seen in the right knee.
